# Patient Record
Sex: Male | Born: 1955
Health system: Southern US, Community
[De-identification: ages and names within clinical notes are randomized; demographics above are authoritative.]

## PROBLEM LIST (undated history)

## (undated) DIAGNOSIS — I10 Essential (primary) hypertension: Secondary | ICD-10-CM

## (undated) DIAGNOSIS — K2970 Gastritis, unspecified, without bleeding: Secondary | ICD-10-CM

## (undated) DIAGNOSIS — K529 Noninfective gastroenteritis and colitis, unspecified: Secondary | ICD-10-CM

## (undated) HISTORY — PX: TONSILLECTOMY: SUR1361

---

## 2005-09-26 ENCOUNTER — Ambulatory Visit (HOSPITAL_COMMUNITY): Admission: RE | Admit: 2005-09-26 | Discharge: 2005-09-26 | Payer: Self-pay | Admitting: Family Medicine

## 2008-08-17 ENCOUNTER — Encounter (INDEPENDENT_AMBULATORY_CARE_PROVIDER_SITE_OTHER): Payer: Self-pay | Admitting: *Deleted

## 2008-09-29 ENCOUNTER — Ambulatory Visit: Payer: Self-pay | Admitting: Gastroenterology

## 2008-09-29 DIAGNOSIS — K625 Hemorrhage of anus and rectum: Secondary | ICD-10-CM | POA: Insufficient documentation

## 2008-09-30 ENCOUNTER — Encounter: Payer: Self-pay | Admitting: Gastroenterology

## 2010-05-16 NOTE — Assessment & Plan Note (Signed)
Summary: rectal bleeding,consult for colonoscopy/ams   Visit Type:  consult Primary Care Provider:  Nobie Putnam, MD  Chief Complaint:  rectal bleeding  x1 and needs tcs.  Colonoscopy        The patient was referred for consultation regarding episode of hematachezia and need for colonoscopy.  He recently has episode of small volume brbpr associated with looser stool which began after starting oral hypoglycemics for newly diagnosed DM.  His meds have been adjusted.  He is having 1-2 formed to loose stools daily without urgency, abd cramping.  Denies rectal pain.  Bleeding stopped after taking suppositories for hemorrhoids.  Denies hb, n/v, dysphagia.  He has lost 25 pounds intentionally since dx of DM.    LABS:  4/10 - CBC, LFTs unremarkable.  Current Medications (verified): 1)  Glipizide Xl 10 Mg Xr24h-Tab (Glipizide) .... Once Daily 2)  Benicar Hct 40-25 Mg Tabs (Olmesartan Medoxomil-Hctz) .... Once Daily 3)  Metformin Hcl 500 Mg Xr24h-Tab (Metformin Hcl) .... 2 Daily With Supper  Allergies (verified): No Known Drug Allergies  Past History:  Past Medical History: Diabetes Hypertension  Past Surgical History: Tonsillectomy  Family History: No FH of Colon Cancer Mother - deceased 27, CVA Father - deceased 43, MI  Social History: Patient is a former smoker. Quit 3 years ago. Smoked for 30 years ago. [employer], 30 years. Married.  3 children in college. Alcohol Use - no   Smoking Status:  quit Drug Use:  no  Review of Systems General:  Complains of weight loss; denies fever and chills. Eyes:  Denies vision loss. ENT:  Denies nasal congestion, sore throat, hoarseness, and difficulty swallowing. CV:  Denies chest pains, angina, palpitations, dyspnea on exertion, and peripheral edema. Resp:  Denies dyspnea at rest, dyspnea with exercise, and cough. GI:  See HPI. GU:  Denies urinary burning and blood in urine. MS:  Denies joint pain / LOM. Derm:  Denies rash. Neuro:   Denies frequent headaches, memory loss, and confusion. Psych:  Denies depression and anxiety. Endo:  Denies unusual weight change. Heme:  Denies bleeding. Allergy:  Denies hives and recurrent infections.  Vital Signs:  Patient profile:   55 year old male Height:      70 inches Weight:      210 pounds BMI:     30.24 Temp:     97.8 degrees F oral Pulse rate:   60 / minute BP sitting:   120 / 78  (left arm) Cuff size:   regular  Vitals Entered By: Hendricks Limes LPN (September 29, 2008 2:39 PM)  Physical Exam  General:  Well developed, well nourished, no acute distress. Head:  Normocephalic and atraumatic. Eyes:  Conjunctivae pink, no scleral icterus.  Mouth:  Oropharyngeal mucosa moist, pink.  No lesions, erythema or exudate.    Neck:  Supple; no masses or thyromegaly. Lungs:  Clear throughout to auscultation. Heart:  Regular rate and rhythm; no murmurs, rubs,  or bruits. Abdomen:  Bowel sounds normal.  Abdomen is soft, nontender, nondistended.  No rebound or guarding.  No hepatosplenomegaly, masses or hernias.  No abdominal bruits.  Rectal:  deferred until time of colonoscopy.   Extremities:  No clubbing, cyanosis, edema or deformities noted. Neurologic:  Alert and  oriented x4;  grossly normal neurologically. Skin:  Intact without significant lesions or rashes. Cervical Nodes:  No significant cervical adenopathy. Psych:  Alert and cooperative. Normal mood and affect.  Impression & Recommendations:  Problem # 1:  RECTAL BLEEDING (ICD-569.3)  Recent small-volume hematachezia.  DDx includes benign-anorectal source, polyps, diverticular bleed, less likely malignancy.  No prior TCS.  Colonoscopy to be performed in near future.  Risks, alternatives, and benefits including but not limited to the risk of reaction to medication, bleeding, infection, and perforation were addressed.  Patient voiced understanding and provided verbal consent.  He will take half-dose of oral hypoglycemics the day  of bowel prep.  Orders: Consultation Level III (02542)  I would like to thank Dr. Nobie Putnam for allowing [place] to take part in the care of this nice patient.

## 2010-05-16 NOTE — Letter (Signed)
Summary: Generic Letter, Intro to Referring  [facility]  [address]   [place], [place] [address]   Phone: [phone]  Fax: [phone]      Aug 17, 2008             RE: Travis Sharp   10/16/1955                 [address]                 [place], [place]  [address]  Dear Appt Secretary,   This pt has been scheduled an appt for 09/29/2008 @ 2:30 w/ Tana Coast, PA.   LMOM with appt info and for a return call.          Sincerely,    Elinor Parkinson  [facility] Ph: [phone]   Fax: [phone]

## 2010-05-16 NOTE — Letter (Signed)
Summary: records from [facility]  records from [facility]   Imported By: Elinor Parkinson 09/30/2008 07:45:02  _____________________________________________________________________  External Attachment:    Type:   Image     Comment:   External Document

## 2011-11-19 ENCOUNTER — Emergency Department (HOSPITAL_COMMUNITY): Payer: 59

## 2011-11-19 ENCOUNTER — Encounter (HOSPITAL_COMMUNITY): Payer: Self-pay | Admitting: Emergency Medicine

## 2011-11-19 ENCOUNTER — Emergency Department (HOSPITAL_COMMUNITY)
Admission: EM | Admit: 2011-11-19 | Discharge: 2011-11-19 | Disposition: A | Discharge status: Home / Self Care | Payer: 59 | Attending: Emergency Medicine | Admitting: Emergency Medicine

## 2011-11-19 DIAGNOSIS — S6710XA Crushing injury of unspecified finger(s), initial encounter: Secondary | ICD-10-CM | POA: Insufficient documentation

## 2011-11-19 DIAGNOSIS — S61219A Laceration without foreign body of unspecified finger without damage to nail, initial encounter: Secondary | ICD-10-CM

## 2011-11-19 DIAGNOSIS — E119 Type 2 diabetes mellitus without complications: Secondary | ICD-10-CM | POA: Insufficient documentation

## 2011-11-19 DIAGNOSIS — I10 Essential (primary) hypertension: Secondary | ICD-10-CM | POA: Insufficient documentation

## 2011-11-19 DIAGNOSIS — S61209A Unspecified open wound of unspecified finger without damage to nail, initial encounter: Secondary | ICD-10-CM | POA: Insufficient documentation

## 2011-11-19 DIAGNOSIS — W230XXA Caught, crushed, jammed, or pinched between moving objects, initial encounter: Secondary | ICD-10-CM | POA: Insufficient documentation

## 2011-11-19 HISTORY — DX: Essential (primary) hypertension: I10

## 2011-11-19 LAB — GLUCOSE, CAPILLARY: Glucose-Capillary: 280 mg/dL — ABNORMAL HIGH (ref 70–99)

## 2011-11-19 MED ORDER — LIDOCAINE HCL (PF) 1 % IJ SOLN
INTRAMUSCULAR | Status: AC
  Administered 2011-11-19: 04:00:00
  Filled 2011-11-19: qty 5

## 2011-11-19 MED ORDER — BUPIVACAINE HCL (PF) 0.25 % IJ SOLN
INTRAMUSCULAR | Status: AC
  Administered 2011-11-19: 30 mL
  Filled 2011-11-19: qty 30

## 2011-11-19 MED ORDER — BUPIVACAINE HCL (PF) 0.5 % IJ SOLN
INTRAMUSCULAR | Status: AC
  Filled 2011-11-19: qty 30

## 2011-11-19 NOTE — ED Provider Notes (Signed)
History     CSN: [id number]  Arrival date & time 11/19/11  0139   None     Chief Complaint  Patient presents with  . Finger Injury    (Consider location/radiation/quality/duration/timing/severity/associated sxs/prior treatment) HPI  Travis Sharp is a 56 y.o. male who presents to the Emergency Department complaining of right little finger pain and laceration after slamming the car trunk on the finger. Has taken no medicines.  Past Medical History  Diagnosis Date  . Hypertension   . Diabetes mellitus     Past Surgical History  Procedure Date  . Tonsillectomy     History reviewed. No pertinent family history.  History  Substance Use Topics  . Smoking status: Former Games developer  . Smokeless tobacco: Not on file  . Alcohol Use: Yes     occ      Review of Systems  Constitutional: Negative for fever.       10 Systems reviewed and are negative for acute change except as noted in the HPI.  HENT: Negative for congestion.   Eyes: Negative for discharge and redness.  Respiratory: Negative for cough and shortness of breath.   Cardiovascular: Negative for chest pain.  Gastrointestinal: Negative for vomiting and abdominal pain.  Musculoskeletal: Negative for back pain.       Right 5th finger laceration  Skin: Negative for rash.  Neurological: Negative for syncope, numbness and headaches.  Psychiatric/Behavioral:       No behavior change.    Allergies  Review of patient's allergies indicates no known allergies.  Home Medications   Current Outpatient Rx  Name Route Sig Dispense Refill  . METFORMIN HCL 500 MG PO TABS Oral Take 500 mg by mouth 2 (two) times daily with a meal.    . OLMESARTAN MEDOXOMIL 40 MG PO TABS Oral Take 40 mg by mouth daily.      BP 145/103  Pulse 74  Temp 98.8 F (37.1 C) (Oral)  Resp 16  Ht 5\' 9"  (1.753 m)  Wt 198 lb (89.812 kg)  BMI 29.24 kg/m2  SpO2 98%  Physical Exam  Nursing note and vitals reviewed. Constitutional:       Awake,  alert, nontoxic appearance.  HENT:  Head: Atraumatic.  Eyes: Right eye exhibits no discharge. Left eye exhibits no discharge.  Neck: Neck supple.  Cardiovascular: Normal heart sounds.   Pulmonary/Chest: Effort normal and breath sounds normal. He exhibits no tenderness.  Abdominal: Soft. There is no tenderness. There is no rebound.  Musculoskeletal: He exhibits no tenderness.       Baseline ROM, no obvious new focal weakness.Right 5th finger with laceration to volar surface that extends around the finger without degloving. Superficial laceration right at leading edge of nail, nail bed intact.   Neurological:       Mental status and motor strength appears baseline for patient and situation.  Skin: No rash noted.  Psychiatric: He has a normal mood and affect.    ED Course  Procedures  Performed by: Annamarie Dawley. Authorized by: Annamarie Dawley Consent: Verbal consent obtained. Risks and benefits: risks, benefits and alternatives were discussed Consent given by: patient Patient identity confirmed: provided demographic data Prepped and Draped in normal sterile fashion Wound explored Laceration Location: right 5th finger Laceration Length: 2 cm No Foreign Bodies seen or palpated Anesthesia: digital block Local anesthetic: bupivacaine 0.25%,lidocaine 1% w/o epinephrine Anesthetic total: 3.5 ml Irrigation method: syringe Amount of cleaning: standard Skin closure: sutures Number of sutures: 2 Technique:simple interrupted Patient  tolerance: Patient tolerated the procedure well with no immediate complications.  Laceration extends around to front of nail, no nail bed involvement. Laceration at nail edge is superficial. Does not require sutures.    Critical care time: 30  Results for orders placed during the hospital encounter of 11/19/11  GLUCOSE, CAPILLARY      Component Value Range   Glucose-Capillary 280 (*) 70 - 99 mg/dL   Dg Finger Little Right  11/19/2011  *RADIOLOGY  REPORT*  Clinical Data: Laceration and bruising to the fifth finger after injury.  RIGHT LITTLE FINGER 2+V  Comparison: None.  Findings: Soft tissue irregularity along the distal aspect of the right fifth finger consistent with soft tissue injury, laceration, and swelling.  Underlying bones appear intact.  No evidence of acute fracture or subluxation.  No focal bone lesion or bone destruction.  IMPRESSION: Soft tissue injury to the distal aspect right fifth finger.  No acute bony abnormalities.  Original Report Authenticated By: Marlon Pel, M.D.    MDM  Patient with crush injury to right 5th finger. Laceration repaired. Vaseline gauze with bulky dressing applied. Pt stable in ED with no significant deterioration in condition.The patient appears reasonably screened and/or stabilized for discharge and I doubt any other medical condition or other [facility] requiring further screening, evaluation, or treatment in the ED at this time prior to discharge.  MDM Reviewed: nursing note and vitals Interpretation: x-ray           Nicoletta Dress. Colon Branch, MD 11/19/11 [phone]

## 2011-11-19 NOTE — ED Notes (Signed)
Patient states he slammed the trunk of his car on his right pinky finger. Open laceration noted to finger, bleeding controlled.

## 2014-12-14 LAB — HEMOGLOBIN A1C: Hemoglobin A1C: 14

## 2015-03-23 ENCOUNTER — Inpatient Hospital Stay (HOSPITAL_COMMUNITY)
Admission: EM | Admit: 2015-03-23 | Discharge: 2015-03-27 | DRG: 392 | Disposition: A | Discharge status: Home / Self Care | Payer: BLUE CROSS/BLUE SHIELD | Attending: Internal Medicine | Admitting: Internal Medicine

## 2015-03-23 ENCOUNTER — Emergency Department (HOSPITAL_COMMUNITY): Payer: BLUE CROSS/BLUE SHIELD

## 2015-03-23 ENCOUNTER — Encounter (HOSPITAL_COMMUNITY): Payer: Self-pay

## 2015-03-23 DIAGNOSIS — E860 Dehydration: Secondary | ICD-10-CM | POA: Diagnosis present

## 2015-03-23 DIAGNOSIS — K529 Noninfective gastroenteritis and colitis, unspecified: Secondary | ICD-10-CM | POA: Diagnosis present

## 2015-03-23 DIAGNOSIS — Z87891 Personal history of nicotine dependence: Secondary | ICD-10-CM

## 2015-03-23 DIAGNOSIS — Z794 Long term (current) use of insulin: Secondary | ICD-10-CM

## 2015-03-23 DIAGNOSIS — R112 Nausea with vomiting, unspecified: Secondary | ICD-10-CM | POA: Diagnosis not present

## 2015-03-23 DIAGNOSIS — E1159 Type 2 diabetes mellitus with other circulatory complications: Secondary | ICD-10-CM

## 2015-03-23 DIAGNOSIS — E119 Type 2 diabetes mellitus without complications: Secondary | ICD-10-CM | POA: Diagnosis present

## 2015-03-23 DIAGNOSIS — R1310 Dysphagia, unspecified: Secondary | ICD-10-CM | POA: Diagnosis present

## 2015-03-23 DIAGNOSIS — E876 Hypokalemia: Secondary | ICD-10-CM | POA: Diagnosis not present

## 2015-03-23 DIAGNOSIS — Z23 Encounter for immunization: Secondary | ICD-10-CM

## 2015-03-23 DIAGNOSIS — K5900 Constipation, unspecified: Secondary | ICD-10-CM | POA: Diagnosis present

## 2015-03-23 DIAGNOSIS — T378X5A Adverse effect of other specified systemic anti-infectives and antiparasitics, initial encounter: Secondary | ICD-10-CM | POA: Diagnosis present

## 2015-03-23 DIAGNOSIS — I10 Essential (primary) hypertension: Secondary | ICD-10-CM | POA: Diagnosis present

## 2015-03-23 HISTORY — DX: Noninfective gastroenteritis and colitis, unspecified: K52.9

## 2015-03-23 LAB — CBC WITH DIFFERENTIAL/PLATELET
Basophils Absolute: 0 10*3/uL (ref 0.0–0.1)
Basophils Relative: 0 %
Eosinophils Absolute: 0 10*3/uL (ref 0.0–0.7)
Eosinophils Relative: 0 %
HCT: 49.7 % (ref 39.0–52.0)
Hemoglobin: 18.3 g/dL — ABNORMAL HIGH (ref 13.0–17.0)
Lymphocytes Relative: 18 %
Lymphs Abs: 1.8 10*3/uL (ref 0.7–4.0)
MCH: 30.7 pg (ref 26.0–34.0)
MCHC: 36.8 g/dL — ABNORMAL HIGH (ref 30.0–36.0)
MCV: 83.4 fL (ref 78.0–100.0)
Monocytes Absolute: 0.6 10*3/uL (ref 0.1–1.0)
Monocytes Relative: 6 %
Neutro Abs: 7.6 10*3/uL (ref 1.7–7.7)
Neutrophils Relative %: 76 %
Platelets: 259 10*3/uL (ref 150–400)
RBC: 5.96 MIL/uL — ABNORMAL HIGH (ref 4.22–5.81)
RDW: 12.8 % (ref 11.5–15.5)
WBC: 10 10*3/uL (ref 4.0–10.5)

## 2015-03-23 LAB — URINALYSIS, ROUTINE W REFLEX MICROSCOPIC
Bilirubin Urine: NEGATIVE
Glucose, UA: NEGATIVE mg/dL
Hgb urine dipstick: NEGATIVE
Ketones, ur: NEGATIVE mg/dL
Leukocytes, UA: NEGATIVE
Nitrite: NEGATIVE
Protein, ur: NEGATIVE mg/dL
Specific Gravity, Urine: 1.015 (ref 1.005–1.030)
pH: 7 (ref 5.0–8.0)

## 2015-03-23 LAB — GLUCOSE, CAPILLARY: Glucose-Capillary: 152 mg/dL — ABNORMAL HIGH (ref 65–99)

## 2015-03-23 LAB — COMPREHENSIVE METABOLIC PANEL
ALT: 24 U/L (ref 17–63)
AST: 19 U/L (ref 15–41)
Albumin: 4.9 g/dL (ref 3.5–5.0)
Alkaline Phosphatase: 55 U/L (ref 38–126)
Anion gap: 12 (ref 5–15)
BUN: 20 mg/dL (ref 6–20)
CO2: 26 mmol/L (ref 22–32)
Calcium: 9.8 mg/dL (ref 8.9–10.3)
Chloride: 96 mmol/L — ABNORMAL LOW (ref 101–111)
Creatinine, Ser: 0.87 mg/dL (ref 0.61–1.24)
GFR calc Af Amer: >60 mL/min (ref >60)
GFR calc non Af Amer: >60 mL/min (ref >60)
Glucose, Bld: 251 mg/dL — ABNORMAL HIGH (ref 65–99)
Potassium: 3.5 mmol/L (ref 3.5–5.1)
Sodium: 134 mmol/L — ABNORMAL LOW (ref 135–145)
Total Bilirubin: 1.8 mg/dL — ABNORMAL HIGH (ref 0.3–1.2)
Total Protein: 8.5 g/dL — ABNORMAL HIGH (ref 6.5–8.1)

## 2015-03-23 LAB — PHOSPHORUS: Phosphorus: 4 mg/dL (ref 2.5–4.6)

## 2015-03-23 LAB — MAGNESIUM: Magnesium: 1.8 mg/dL (ref 1.7–2.4)

## 2015-03-23 LAB — CBG MONITORING, ED: Glucose-Capillary: 224 mg/dL — ABNORMAL HIGH (ref 65–99)

## 2015-03-23 LAB — LIPASE, BLOOD: Lipase: 45 U/L (ref 11–51)

## 2015-03-23 MED ORDER — CIPROFLOXACIN IN D5W 400 MG/200ML IV SOLN: 400.0000 mg | Freq: Two times a day (BID) | INTRAVENOUS | Status: DC

## 2015-03-23 MED ORDER — SODIUM CHLORIDE 0.9 % IV BOLUS (SEPSIS)
1000.0000 mL | Freq: Once | INTRAVENOUS | Status: AC
  Administered 2015-03-23: 1000 mL via INTRAVENOUS

## 2015-03-23 MED ORDER — HEPARIN SODIUM (PORCINE) 5000 UNIT/ML IJ SOLN
5000.0000 [IU] | Freq: Three times a day (TID) | INTRAMUSCULAR | Status: DC
  Administered 2015-03-24 – 2015-03-27 (×10): 5000 [IU] via SUBCUTANEOUS
  Filled 2015-03-23 (×11): qty 1

## 2015-03-23 MED ORDER — SODIUM CHLORIDE 0.9 % IV SOLN: 250.0000 mL | INTRAVENOUS | Status: DC | PRN

## 2015-03-23 MED ORDER — SODIUM CHLORIDE 0.9 % IJ SOLN: 3.0000 mL | INTRAMUSCULAR | Status: DC | PRN

## 2015-03-23 MED ORDER — SODIUM CHLORIDE 0.9 % IJ SOLN
3.0000 mL | Freq: Two times a day (BID) | INTRAMUSCULAR | Status: DC
  Administered 2015-03-25 – 2015-03-27 (×3): 3 mL via INTRAVENOUS

## 2015-03-23 MED ORDER — INSULIN ASPART 100 UNIT/ML ~~LOC~~ SOLN
0.0000 [IU] | Freq: Three times a day (TID) | SUBCUTANEOUS | Status: DC
  Administered 2015-03-23: 2 [IU] via SUBCUTANEOUS
  Administered 2015-03-24 – 2015-03-27 (×9): 1 [IU] via SUBCUTANEOUS

## 2015-03-23 MED ORDER — SODIUM CHLORIDE 0.9 % IV SOLN
INTRAVENOUS | Status: DC
  Administered 2015-03-23: 19:00:00 via INTRAVENOUS

## 2015-03-23 MED ORDER — PROMETHAZINE HCL 25 MG/ML IJ SOLN
12.5000 mg | Freq: Once | INTRAMUSCULAR | Status: AC
  Administered 2015-03-23: 12.5 mg via INTRAVENOUS
  Filled 2015-03-23: qty 1

## 2015-03-23 MED ORDER — OXYCODONE HCL 5 MG PO TABS: 5.0000 mg | ORAL_TABLET | ORAL | Status: DC | PRN

## 2015-03-23 MED ORDER — INFLUENZA VAC SPLIT QUAD 0.5 ML IM SUSY
0.5000 mL | PREFILLED_SYRINGE | INTRAMUSCULAR | Status: AC
  Administered 2015-03-24: 0.5 mL via INTRAMUSCULAR
  Filled 2015-03-23: qty 0.5

## 2015-03-23 MED ORDER — IOHEXOL 300 MG/ML  SOLN
100.0000 mL | Freq: Once | INTRAMUSCULAR | Status: AC | PRN
  Administered 2015-03-23: 100 mL via INTRAVENOUS

## 2015-03-23 MED ORDER — METRONIDAZOLE IN NACL 5-0.79 MG/ML-% IV SOLN: 500.0000 mg | Freq: Three times a day (TID) | INTRAVENOUS | Status: DC

## 2015-03-23 MED ORDER — ONDANSETRON HCL 4 MG PO TABS: 4.0000 mg | ORAL_TABLET | Freq: Four times a day (QID) | ORAL | Status: DC | PRN

## 2015-03-23 MED ORDER — CIPROFLOXACIN IN D5W 400 MG/200ML IV SOLN
400.0000 mg | Freq: Two times a day (BID) | INTRAVENOUS | Status: DC
  Administered 2015-03-24 – 2015-03-26 (×6): 400 mg via INTRAVENOUS
  Filled 2015-03-23 (×6): qty 200

## 2015-03-23 MED ORDER — METRONIDAZOLE IN NACL 5-0.79 MG/ML-% IV SOLN
500.0000 mg | Freq: Once | INTRAVENOUS | Status: AC
  Administered 2015-03-23: 500 mg via INTRAVENOUS
  Filled 2015-03-23: qty 100

## 2015-03-23 MED ORDER — ONDANSETRON HCL 4 MG/2ML IJ SOLN
4.0000 mg | Freq: Four times a day (QID) | INTRAMUSCULAR | Status: DC | PRN
  Administered 2015-03-23 – 2015-03-24 (×4): 4 mg via INTRAVENOUS
  Filled 2015-03-23 (×7): qty 2

## 2015-03-23 MED ORDER — CIPROFLOXACIN IN D5W 400 MG/200ML IV SOLN
400.0000 mg | Freq: Once | INTRAVENOUS | Status: AC
  Administered 2015-03-23: 400 mg via INTRAVENOUS
  Filled 2015-03-23: qty 200

## 2015-03-23 MED ORDER — ONDANSETRON HCL 4 MG/2ML IJ SOLN
4.0000 mg | Freq: Once | INTRAMUSCULAR | Status: AC
  Administered 2015-03-23: 4 mg via INTRAVENOUS
  Filled 2015-03-23: qty 2

## 2015-03-23 MED ORDER — METRONIDAZOLE IN NACL 5-0.79 MG/ML-% IV SOLN
500.0000 mg | Freq: Three times a day (TID) | INTRAVENOUS | Status: DC
  Administered 2015-03-23 – 2015-03-25 (×5): 500 mg via INTRAVENOUS
  Filled 2015-03-23 (×5): qty 100

## 2015-03-23 MED ORDER — PNEUMOCOCCAL VAC POLYVALENT 25 MCG/0.5ML IJ INJ
0.5000 mL | INJECTION | INTRAMUSCULAR | Status: AC
  Administered 2015-03-24: 0.5 mL via INTRAMUSCULAR
  Filled 2015-03-23: qty 0.5

## 2015-03-23 MED ORDER — MORPHINE SULFATE (PF) 2 MG/ML IV SOLN: 1.0000 mg | INTRAVENOUS | Status: DC | PRN

## 2015-03-23 MED ORDER — INSULIN ASPART 100 UNIT/ML ~~LOC~~ SOLN: 0.0000 [IU] | Freq: Every day | SUBCUTANEOUS | Status: DC

## 2015-03-23 MED ORDER — IRBESARTAN 300 MG PO TABS
300.0000 mg | ORAL_TABLET | Freq: Every day | ORAL | Status: DC
  Administered 2015-03-24: 300 mg via ORAL
  Filled 2015-03-23 (×2): qty 1

## 2015-03-23 MED ORDER — INSULIN DETEMIR 100 UNIT/ML ~~LOC~~ SOLN
10.0000 [IU] | Freq: Every day | SUBCUTANEOUS | Status: DC
  Administered 2015-03-24 – 2015-03-26 (×4): 10 [IU] via SUBCUTANEOUS
  Filled 2015-03-23 (×5): qty 0.1

## 2015-03-23 MED ORDER — HEPARIN SODIUM (PORCINE) 5000 UNIT/ML IJ SOLN: 5000.0000 [IU] | Freq: Three times a day (TID) | INTRAMUSCULAR | Status: DC

## 2015-03-23 NOTE — ED Notes (Signed)
Pt reports intermittent nausea, vomiting, weakness, and left side pain x 1 month.  Pt says has had some problems with constipation but had a bm this morning.  Reports stool was black.  Pt says has noticed black stools off and on.

## 2015-03-23 NOTE — ED Provider Notes (Signed)
CSN: [id number]     Arrival date & time 03/23/15  [id number] History   First MD Initiated Contact with Patient 03/23/15 0701     Chief Complaint  Patient presents with  . Emesis     (Consider location/radiation/quality/duration/timing/severity/associated sxs/prior Treatment) Patient is a 59 y.o. male presenting with vomiting. The history is provided by the patient.  Emesis Severity:  Moderate Associated symptoms: abdominal pain and chills   Associated symptoms: no diarrhea and no headaches    patient has had nausea and vomiting. He's had for last few days. Worse with eating. States he has had some constipation. States he has had black stool and some black emesis. Set some mild crampy abdominal pain worse on left side. No fevers. He has had a good appetite but states as soon as he eats the appetite will go away. He states he did have some chills. He has lost close to 50 pounds over the last several months. He has not been trying. He is a former smoker. He does smoke some marijuana but states not every day.   Past Medical History  Diagnosis Date  . Hypertension   . Diabetes mellitus    Past Surgical History  Procedure Laterality Date  . Tonsillectomy     No family history on file. Social History  Substance Use Topics  . Smoking status: Former Research scientist (life sciences)  . Smokeless tobacco: None  . Alcohol Use: Yes     Comment: occ    Review of Systems  Constitutional: Positive for chills, appetite change and unexpected weight change. Negative for activity change.  Eyes: Negative for pain.  Respiratory: Negative for chest tightness and shortness of breath.   Cardiovascular: Negative for chest pain and leg swelling.  Gastrointestinal: Positive for nausea, vomiting and abdominal pain. Negative for diarrhea.  Genitourinary: Negative for flank pain.  Musculoskeletal: Negative for back pain and neck stiffness.  Skin: Negative for rash.  Neurological: Negative for weakness, numbness and headaches.   Psychiatric/Behavioral: Negative for behavioral problems.      Allergies  Review of patient's allergies indicates no known allergies.  Home Medications   Prior to Admission medications   Medication Sig Start Date End Date Taking? Authorizing Provider  BENICAR HCT 40-25 MG tablet Take 1 tablet by mouth daily. 12/14/14  Yes Historical Provider, MD  ibuprofen (ADVIL,MOTRIN) 200 MG tablet Take 200 mg by mouth every 6 (six) hours as needed.   Yes Historical Provider, MD  Insulin Degludec (TRESIBA FLEXTOUCH) 100 UNIT/ML SOPN Inject 22 Units into the skin daily.   Yes Historical Provider, MD  insulin detemir (LEVEMIR) 100 UNIT/ML injection Inject 20 Units into the skin at bedtime.   Yes Historical Provider, MD   BP 115/78 mmHg  Pulse 80  Temp(Src) 98.3 F (36.8 C) (Oral)  Resp 18  Ht 5\' 10"  (1.778 m)  Wt 180 lb (81.647 kg)  BMI 25.83 kg/m2  SpO2 100% Physical Exam  Constitutional: He appears well-developed and well-nourished.  HENT:  Head: Atraumatic.  Neck: Neck supple.  Cardiovascular:  Mild tachycardia  Pulmonary/Chest: Effort normal.  Abdominal: Soft. There is no tenderness.  Musculoskeletal: Normal range of motion.  Neurological: He is alert.  Skin: Skin is warm.    ED Course  Procedures (including critical care time) Labs Review Labs Reviewed  CBC WITH DIFFERENTIAL/PLATELET - Abnormal; Notable for the following:    RBC 5.96 (*)    Hemoglobin 18.3 (*)    MCHC 36.8 (*)    All other components within normal limits  COMPREHENSIVE METABOLIC PANEL - Abnormal; Notable for the following:    Sodium 134 (*)    Chloride 96 (*)    Glucose, Bld 251 (*)    Total Protein 8.5 (*)    Total Bilirubin 1.8 (*)    All other components within normal limits  CBG MONITORING, ED - Abnormal; Notable for the following:    Glucose-Capillary 224 (*)    All other components within normal limits  URINALYSIS, ROUTINE W REFLEX MICROSCOPIC (NOT AT [facility])  LIPASE, BLOOD  POC OCCULT BLOOD, ED     Imaging Review Dg Chest 2 View  03/23/2015  CLINICAL DATA:  Weight loss.  Former smoker. EXAM: CHEST  2 VIEW COMPARISON:  09/26/2005 FINDINGS: Normal heart size and mediastinal contours. No acute infiltrate or edema. No effusion or pneumothorax. No acute osseous findings. IMPRESSION: No acute finding or change from 2007. Electronically Signed   By: Monte Fantasia M.D.   On: 03/23/2015 08:04   Ct Abdomen Pelvis W Contrast  03/23/2015  CLINICAL DATA:  59 year old male with 1 month of left side abdominal pain. Black stools. Initial encounter. EXAM: CT ABDOMEN AND PELVIS WITH CONTRAST TECHNIQUE: Multidetector CT imaging of the abdomen and pelvis was performed using the standard protocol following bolus administration of intravenous contrast. CONTRAST:  158mL OMNIPAQUE IOHEXOL 300 MG/ML  SOLN COMPARISON:  None. FINDINGS: Negative lung bases.  No pericardial or pleural effusion. No acute osseous abnormality identified. No pelvic free fluid.  Unremarkable urinary bladder. The colon is mostly decompressed throughout the abdomen and appears mildly to moderately thick walled throughout. This is most apparent in the sigmoid colon (wall thickness up to 10 mm). There is generalized mild mesenteric stranding/congestion. The appendix is normal. No diverticulosis identified. No pneumoperitoneum. No abdominal free fluid. No dilated or abnormal small bowel loops. Small volume of oral contrast in the stomach, duodenum and jejunum. Liver, gallbladder, spleen, pancreas, and adrenal glands are within normal limits. Portal venous system within normal limits. Bilateral renal enhancement and contrast excretion within normal limits. Aortoiliac calcified atherosclerosis noted. Major arterial structures are patent. No lymphadenopathy. IMPRESSION: 1. Mild-to-moderate diffuse colitis suspected. No associated free fluid or complicating features. 2. No other acute findings.  Calcified aortic atherosclerosis. Electronically Signed   By:  Genevie Ann M.D.   On: 03/23/2015 11:41   I have personally reviewed and evaluated these images and lab results as part of my medical decision-making.   EKG Interpretation None      MDM   Final diagnoses:  Colitis    Patient with abdominal pain nausea and vomiting. Has elevated hemoglobin. Likely due to dehydration. Has also had a fair amount weight loss. Continue to have nausea and vomiting here. CT scan shows a colitis. Unable tolerate orals will admit to internal medicine.    Davonna Belling, MD 03/23/15 1501

## 2015-03-23 NOTE — ED Notes (Signed)
Patient in restroom. States needs to have BM. Also will obtain urine specimen.

## 2015-03-23 NOTE — ED Notes (Signed)
Patient c/o nausea. Dry heaving noted. MD aware. Verbal order for Phenergan obtained.

## 2015-03-23 NOTE — H&P (Signed)
[facility] History and Physical  KJ COENEN [id number] DOB: 12-25-1955 DOA: 03/23/2015  Referring physician: Dr. Alvino Chapel PCP: Collene Mares, PA-C   Chief Complaint: nausea and emesis  HPI: Travis Sharp is a 59 y.o. male  With history of diabetes mellitus type 2 and essential hypertension. Presents to the hospital complaining of one month of nausea and emesis which has waxed and waned. Nothing he is aware of makes it better or worse. The problem is persistent since onset. Today was gradually getting worse and as such patient presented to the ED for further evaluation recommendations. Patient also endorses poor oral intake. Denies any bright red blood per rectum or melena.   Review of Systems:  Constitutional:  No weight loss, night sweats, Fevers, chills, fatigue.  HEENT:  No headaches, Difficulty swallowing,Tooth/dental problems,Sore throat,  No sneezing, itching, ear ache, nasal congestion, post nasal drip,  Cardio-vascular:  No chest pain, Orthopnea, PND, swelling in lower extremities, anasarca, dizziness, palpitations  GI:  No heartburn, indigestion, + abdominal pain, + nausea, + vomiting, diarrhea, change in bowel habits,+ loss of appetite  Resp:  No shortness of breath with exertion or at rest. No excess mucus, no productive cough, No non-productive cough, No coughing up of blood.No change in color of mucus.No wheezing.No chest wall deformity  Skin:  no rash or lesions.  GU:  no dysuria, change in color of urine, no urgency or frequency. No flank pain.  Musculoskeletal:  No joint pain or swelling. No decreased range of motion. No back pain.  Psych:  No change in mood or affect. No depression or anxiety. No memory loss.   Past Medical History  Diagnosis Date  . Hypertension   . Diabetes mellitus    Past Surgical History  Procedure Laterality Date  . Tonsillectomy     Social History:  reports that he has quit smoking. He does not have any smokeless  tobacco history on file. He reports that he drinks alcohol. He reports that he does not use illicit drugs.  No Known Allergies  Family history: None reported  Prior to Admission medications   Medication Sig Start Date End Date Taking? Authorizing Provider  BENICAR HCT 40-25 MG tablet Take 1 tablet by mouth daily. 12/14/14  Yes Historical Provider, MD  ibuprofen (ADVIL,MOTRIN) 200 MG tablet Take 200 mg by mouth every 6 (six) hours as needed.   Yes Historical Provider, MD  Insulin Degludec (TRESIBA FLEXTOUCH) 100 UNIT/ML SOPN Inject 22 Units into the skin daily.   Yes Historical Provider, MD  insulin detemir (LEVEMIR) 100 UNIT/ML injection Inject 20 Units into the skin at bedtime.   Yes Historical Provider, MD   Physical Exam: Filed Vitals:   03/23/15 0707 03/23/15 0956 03/23/15 1258 03/23/15 1500  BP: 141/91 117/88 115/78 128/88  Pulse: 106 81 80 76  Temp: 97.8 F (36.6 C) 98.3 F (36.8 C)    TempSrc: Oral Oral    Resp:  22 18 18   Height: 5\' 10"  (1.778 m)     Weight: 81.647 kg (180 lb)     SpO2: 98% 100% 100% 100%    Wt Readings from Last 3 Encounters:  03/23/15 81.647 kg (180 lb)  11/19/11 89.812 kg (198 lb)  09/29/08 95.255 kg (210 lb)    General:  Appears calm and uncomfortable Eyes: PERRL, normal lids, irises & conjunctiva ENT: grossly normal hearing, lips & tongue, dry mucous membranes Neck: no LAD, masses or thyromegaly Cardiovascular: RRR, no m/r/g. No LE edema. Respiratory: CTA bilaterally,  no w/r/r. Normal respiratory effort. Abdomen: soft, mild tenderness with deep palpation over left lower quadrant. No rebound tenderness, nd Skin: no rash or induration seen on limited exam Musculoskeletal: grossly normal tone BUE/BLE Psychiatric: grossly normal mood and affect, speech fluent and appropriate Neurologic: Answers questions properly, moves extremities equally           Labs on Admission:  Basic Metabolic Panel:  Recent Labs Lab 03/23/15 0708  NA 134*  K  3.5  CL 96*  CO2 26  GLUCOSE 251*  BUN 20  CREATININE 0.87  CALCIUM 9.8   Liver Function Tests:  Recent Labs Lab 03/23/15 0708  AST 19  ALT 24  ALKPHOS 55  BILITOT 1.8*  PROT 8.5*  ALBUMIN 4.9    Recent Labs Lab 03/23/15 0708  LIPASE 45   No results for input(s): AMMONIA in the last 168 hours. CBC:  Recent Labs Lab 03/23/15 0708  WBC 10.0  NEUTROABS 7.6  HGB 18.3*  HCT 49.7  MCV 83.4  PLT 259   Cardiac Enzymes: No results for input(s): CKTOTAL, CKMB, CKMBINDEX, TROPONINI in the last 168 hours.  BNP (last 3 results) No results for input(s): BNP in the last 8760 hours.  ProBNP (last 3 results) No results for input(s): PROBNP in the last 8760 hours.  CBG:  Recent Labs Lab 03/23/15 0707  GLUCAP 224*    Radiological Exams on Admission: Dg Chest 2 View  03/23/2015  CLINICAL DATA:  Weight loss.  Former smoker. EXAM: CHEST  2 VIEW COMPARISON:  09/26/2005 FINDINGS: Normal heart size and mediastinal contours. No acute infiltrate or edema. No effusion or pneumothorax. No acute osseous findings. IMPRESSION: No acute finding or change from 2007. Electronically Signed   By: Monte Fantasia M.D.   On: 03/23/2015 08:04   Ct Abdomen Pelvis W Contrast  03/23/2015  CLINICAL DATA:  59 year old male with 1 month of left side abdominal pain. Black stools. Initial encounter. EXAM: CT ABDOMEN AND PELVIS WITH CONTRAST TECHNIQUE: Multidetector CT imaging of the abdomen and pelvis was performed using the standard protocol following bolus administration of intravenous contrast. CONTRAST:  178mL OMNIPAQUE IOHEXOL 300 MG/ML  SOLN COMPARISON:  None. FINDINGS: Negative lung bases.  No pericardial or pleural effusion. No acute osseous abnormality identified. No pelvic free fluid.  Unremarkable urinary bladder. The colon is mostly decompressed throughout the abdomen and appears mildly to moderately thick walled throughout. This is most apparent in the sigmoid colon (wall thickness up to 10  mm). There is generalized mild mesenteric stranding/congestion. The appendix is normal. No diverticulosis identified. No pneumoperitoneum. No abdominal free fluid. No dilated or abnormal small bowel loops. Small volume of oral contrast in the stomach, duodenum and jejunum. Liver, gallbladder, spleen, pancreas, and adrenal glands are within normal limits. Portal venous system within normal limits. Bilateral renal enhancement and contrast excretion within normal limits. Aortoiliac calcified atherosclerosis noted. Major arterial structures are patent. No lymphadenopathy. IMPRESSION: 1. Mild-to-moderate diffuse colitis suspected. No associated free fluid or complicating features. 2. No other acute findings.  Calcified aortic atherosclerosis. Electronically Signed   By: Genevie Ann M.D.   On: 03/23/2015 11:41     Assessment/Plan Active Problems:   Colitis - CT of abdomen and pelvis report reviewed. Reporting colitis. - Place on Cipro and Flagyl - Place on clear liquid diet  Nausea and vomiting - Secondary to principal problem list above - We'll plan on treating supportively  Diabetic diet - We'll plan on advancing diet to diabetic diet once tolerated -  Place on sliding scale insulin and continue long acting insulin at half dose as patient is not eating well  Essential hypertension - We'll plan on continuing ARB  Code Status: Full DVT Prophylaxis: Heparin Family Communication: Discussed with patient and family at bedside Disposition Plan: Pending improvement in condition  Time spent: > 45 minutes  Velvet Bathe [facility] Pager [phone]

## 2015-03-23 NOTE — ED Notes (Signed)
MD at bedside. 

## 2015-03-24 DIAGNOSIS — I10 Essential (primary) hypertension: Secondary | ICD-10-CM

## 2015-03-24 DIAGNOSIS — E119 Type 2 diabetes mellitus without complications: Secondary | ICD-10-CM | POA: Diagnosis not present

## 2015-03-24 DIAGNOSIS — R112 Nausea with vomiting, unspecified: Secondary | ICD-10-CM

## 2015-03-24 DIAGNOSIS — K529 Noninfective gastroenteritis and colitis, unspecified: Secondary | ICD-10-CM | POA: Diagnosis not present

## 2015-03-24 DIAGNOSIS — E1159 Type 2 diabetes mellitus with other circulatory complications: Secondary | ICD-10-CM

## 2015-03-24 DIAGNOSIS — E876 Hypokalemia: Secondary | ICD-10-CM | POA: Diagnosis not present

## 2015-03-24 DIAGNOSIS — Z794 Long term (current) use of insulin: Secondary | ICD-10-CM

## 2015-03-24 LAB — CBC
HCT: 43.4 % (ref 39.0–52.0)
Hemoglobin: 16.2 g/dL (ref 13.0–17.0)
MCH: 31.6 pg (ref 26.0–34.0)
MCHC: 37.3 g/dL — ABNORMAL HIGH (ref 30.0–36.0)
MCV: 84.8 fL (ref 78.0–100.0)
Platelets: 220 10*3/uL (ref 150–400)
RBC: 5.12 MIL/uL (ref 4.22–5.81)
RDW: 12.6 % (ref 11.5–15.5)
WBC: 8.9 10*3/uL (ref 4.0–10.5)

## 2015-03-24 LAB — BASIC METABOLIC PANEL
Anion gap: 8 (ref 5–15)
BUN: 15 mg/dL (ref 6–20)
CO2: 28 mmol/L (ref 22–32)
Calcium: 9 mg/dL (ref 8.9–10.3)
Chloride: 101 mmol/L (ref 101–111)
Creatinine, Ser: 0.91 mg/dL (ref 0.61–1.24)
GFR calc Af Amer: >60 mL/min (ref >60)
GFR calc non Af Amer: >60 mL/min (ref >60)
Glucose, Bld: 161 mg/dL — ABNORMAL HIGH (ref 65–99)
Potassium: 3.2 mmol/L — ABNORMAL LOW (ref 3.5–5.1)
Sodium: 137 mmol/L (ref 135–145)

## 2015-03-24 LAB — GLUCOSE, CAPILLARY
Glucose-Capillary: 177 mg/dL — ABNORMAL HIGH (ref 65–99)
Glucose-Capillary: 132 mg/dL — ABNORMAL HIGH (ref 65–99)
Glucose-Capillary: 146 mg/dL — ABNORMAL HIGH (ref 65–99)
Glucose-Capillary: 133 mg/dL — ABNORMAL HIGH (ref 65–99)
Glucose-Capillary: 177 mg/dL — ABNORMAL HIGH (ref 65–99)

## 2015-03-24 MED ORDER — ONDANSETRON HCL 4 MG/2ML IJ SOLN
4.0000 mg | Freq: Once | INTRAMUSCULAR | Status: AC
Start: 2015-03-24 — End: 2015-03-24
  Administered 2015-03-24: 4 mg via INTRAVENOUS
  Filled 2015-03-24: qty 2

## 2015-03-24 MED ORDER — PROMETHAZINE HCL 25 MG/ML IJ SOLN
12.5000 mg | INTRAMUSCULAR | Status: DC | PRN
  Administered 2015-03-24 – 2015-03-25 (×4): 12.5 mg via INTRAVENOUS
  Filled 2015-03-24 (×4): qty 1

## 2015-03-24 MED ORDER — POTASSIUM CHLORIDE IN NACL 40-0.9 MEQ/L-% IV SOLN
INTRAVENOUS | Status: DC
  Administered 2015-03-24 – 2015-03-26 (×4): 75 mL/h via INTRAVENOUS

## 2015-03-24 MED ORDER — LORAZEPAM 2 MG/ML IJ SOLN
1.0000 mg | Freq: Once | INTRAMUSCULAR | Status: AC
  Administered 2015-03-24: 1 mg via INTRAVENOUS
  Filled 2015-03-24: qty 1

## 2015-03-24 NOTE — Progress Notes (Signed)
[facility] PROGRESS NOTE  ARTAN VOYTKO [id number] DOB: [birth year] DOA: 03/23/2015 PCP: Collene Mares, PA-C    Code Status: Full code Family Communication: Discussed with wife. Disposition Plan: Discharge when clinically appropriate, likely in the next 2-3 days.   Consultants:  None  Procedures:  None  Antibiotics:  Cipro 03/23/59  Flagyl 03/23/15  HPI/Subjective: Patient complains of nausea, but no vomiting. He is tolerating clear liquids.  Objective: Filed Vitals:   03/23/15 2208 03/24/15 0735  BP: 127/89 138/97  Pulse: 76 74  Temp: 98.8 F (37.1 C) 98.3 F (36.8 C)  Resp: 20 20   oxygen saturation 100% on room air.  Intake/Output Summary (Last 24 hours) at 03/24/15 1144 Last data filed at 03/24/15 0517  Gross per 24 hour  Intake   1205 ml  Output      0 ml  Net   1205 ml   Filed Weights   03/23/15 0707 03/23/15 1755  Weight: 81.647 kg (180 lb) 81.194 kg (179 lb)    Exam:   General:  59 year old man sitting up in bed, in no acute distress, but he appears ill.  Cardiovascular: S1, S2, with no murmurs rubs or gallops.   Respiratory: Clear to auscultation bilaterally.   Abdomen: Positive bowel sounds, soft, mildly tender diffusely with no distention or masses palpated.  Musculoskeletal/extremities: No pedal edema. No acute hot red joints.   Data Reviewed: Basic Metabolic Panel:  Recent Labs Lab 03/23/15 0708 03/23/15 1946 03/24/15 0627  NA 134*  --  137  K 3.5  --  3.2*  CL 96*  --  101  CO2 26  --  28  GLUCOSE 251*  --  161*  BUN 20  --  15  CREATININE 0.87  --  0.91  CALCIUM 9.8  --  9.0  MG  --  1.8  --   PHOS  --  4.0  --    Liver Function Tests:  Recent Labs Lab 03/23/15 0708  AST 19  ALT 24  ALKPHOS 55  BILITOT 1.8*  PROT 8.5*  ALBUMIN 4.9    Recent Labs Lab 03/23/15 0708  LIPASE 45   No results for input(s): AMMONIA in the last 168 hours. CBC:  Recent Labs Lab 03/23/15 0708 03/24/15 0627  WBC  10.0 8.9  NEUTROABS 7.6  --   HGB 18.3* 16.2  HCT 49.7 43.4  MCV 83.4 84.8  PLT 259 220   Cardiac Enzymes: No results for input(s): CKTOTAL, CKMB, CKMBINDEX, TROPONINI in the last 168 hours. BNP (last 3 results) No results for input(s): BNP in the last 8760 hours.  ProBNP (last 3 results) No results for input(s): PROBNP in the last 8760 hours.  CBG:  Recent Labs Lab 03/23/15 0707 03/23/15 1754 03/23/15 2208 03/24/15 0725 03/24/15 1136  GLUCAP 224* 152* 177* 132* 146*    No results found for this or any previous visit (from the past 240 hour(s)).   Studies: Dg Chest 2 View  03/23/2015  CLINICAL DATA:  Weight loss.  Former smoker. EXAM: CHEST  2 VIEW COMPARISON:  09/26/2005 FINDINGS: Normal heart size and mediastinal contours. No acute infiltrate or edema. No effusion or pneumothorax. No acute osseous findings. IMPRESSION: No acute finding or change from 2007. Electronically Signed   By: Monte Fantasia M.D.   On: 03/23/2015 08:04   Ct Abdomen Pelvis W Contrast  03/23/2015  CLINICAL DATA:  59 year old male with 1 month of left side abdominal pain. Black stools. Initial encounter. EXAM: CT  ABDOMEN AND PELVIS WITH CONTRAST TECHNIQUE: Multidetector CT imaging of the abdomen and pelvis was performed using the standard protocol following bolus administration of intravenous contrast. CONTRAST:  18mL OMNIPAQUE IOHEXOL 300 MG/ML  SOLN COMPARISON:  None. FINDINGS: Negative lung bases.  No pericardial or pleural effusion. No acute osseous abnormality identified. No pelvic free fluid.  Unremarkable urinary bladder. The colon is mostly decompressed throughout the abdomen and appears mildly to moderately thick walled throughout. This is most apparent in the sigmoid colon (wall thickness up to 10 mm). There is generalized mild mesenteric stranding/congestion. The appendix is normal. No diverticulosis identified. No pneumoperitoneum. No abdominal free fluid. No dilated or abnormal small bowel  loops. Small volume of oral contrast in the stomach, duodenum and jejunum. Liver, gallbladder, spleen, pancreas, and adrenal glands are within normal limits. Portal venous system within normal limits. Bilateral renal enhancement and contrast excretion within normal limits. Aortoiliac calcified atherosclerosis noted. Major arterial structures are patent. No lymphadenopathy. IMPRESSION: 1. Mild-to-moderate diffuse colitis suspected. No associated free fluid or complicating features. 2. No other acute findings.  Calcified aortic atherosclerosis. Electronically Signed   By: Genevie Ann M.D.   On: 03/23/2015 11:41    Scheduled Meds: . ciprofloxacin  400 mg Intravenous Q12H  . heparin  5,000 Units Subcutaneous 3 times per day  . insulin aspart  0-5 Units Subcutaneous QHS  . insulin aspart  0-9 Units Subcutaneous TID WC  . insulin detemir  10 Units Subcutaneous QHS  . irbesartan  300 mg Oral Daily  . metronidazole  500 mg Intravenous Q8H  . sodium chloride  3 mL Intravenous Q12H   Continuous Infusions: . 0.9 % NaCl with KCl 40 mEq / L 75 mL/hr (03/24/15 0754)   Assessment and plan: Principal Problem:   Colitis Active Problems:   Nausea and vomiting   Essential hypertension   Controlled type 2 diabetes mellitus without complication ([place])   1. Mild to moderate diffuse colitis. The patient's CT scan on admission revealed mild to moderate diffuse colitis, without complete dictating features. He presented with nausea, vomiting, and minimal abdominal pain. He denied diarrhea. On admission, he was afebrile and hemodynamically stable. -Patient was started on IV Cipro and Flagyl. Zofran and Phenergan were ordered as needed for nausea/vomiting. IV fluids were ordered. -He was started on a clear liquid diet which he is tolerating. We'll hold off on advancing for now.  Type 2 diabetes mellitus. The patient is treated chronically with insulin. He was started on sliding-scale NovoLog. His CBG was initially  above 200, but has improved.  Hypertension. The patient is treated chronically with Benicar/HCTZ. HCTZ is being withheld due to mild volume depletion from the colitis. Benicar was restarted (Avapro substituted). His blood pressure has been controlled.  Hypokalemia. The patient's potassium fell from 3.5 on admission to 3.2. Will replete with potassium chloride added to the IV fluids rather than orally due to his nausea. We'll order a magnesium level for further evaluation.    Time spent: 35 minutes.    [facility] Pager [phone]. If 7PM-7AM, please contact night-coverage at [email or web address], password [email or web address] 03/24/2015, 11:44 AM

## 2015-03-24 NOTE — Care Management Note (Signed)
Case Management Note  Patient Details  Name: Travis Sharp MRN: [id number] Date of Birth: 1955-12-03  Subjective/Objective:                  Pt admitted from home with colitis. Pt lives with his wife and will return home at discharge. Pt is independent with ADL's. Pt has a glucometer for home use.  Action/Plan: No CM needs noted.  Expected Discharge Date:  03/26/15               Expected Discharge Plan:  Home/Self Care  In-House Referral:  NA  Discharge planning Services  CM Consult  Post Acute Care Choice:  NA Choice offered to:  NA  DME Arranged:    DME Agency:     HH Arranged:    HH Agency:     Status of Service:  Completed, signed off  Medicare Important Message Given:    Date Medicare IM Given:    Medicare IM give by:    Date Additional Medicare IM Given:    Additional Medicare Important Message give by:     If discussed at [place] of Stay Meetings, dates discussed:    Additional Comments:  Joylene Draft, RN 03/24/2015, 11:03 AM

## 2015-03-25 DIAGNOSIS — E860 Dehydration: Secondary | ICD-10-CM | POA: Diagnosis present

## 2015-03-25 DIAGNOSIS — I10 Essential (primary) hypertension: Secondary | ICD-10-CM | POA: Diagnosis present

## 2015-03-25 DIAGNOSIS — Z794 Long term (current) use of insulin: Secondary | ICD-10-CM | POA: Diagnosis not present

## 2015-03-25 DIAGNOSIS — E876 Hypokalemia: Secondary | ICD-10-CM | POA: Diagnosis not present

## 2015-03-25 DIAGNOSIS — K5900 Constipation, unspecified: Secondary | ICD-10-CM | POA: Diagnosis present

## 2015-03-25 DIAGNOSIS — K529 Noninfective gastroenteritis and colitis, unspecified: Secondary | ICD-10-CM | POA: Diagnosis present

## 2015-03-25 DIAGNOSIS — R112 Nausea with vomiting, unspecified: Secondary | ICD-10-CM | POA: Diagnosis present

## 2015-03-25 DIAGNOSIS — Z87891 Personal history of nicotine dependence: Secondary | ICD-10-CM | POA: Diagnosis not present

## 2015-03-25 DIAGNOSIS — E119 Type 2 diabetes mellitus without complications: Secondary | ICD-10-CM | POA: Diagnosis present

## 2015-03-25 DIAGNOSIS — T378X5A Adverse effect of other specified systemic anti-infectives and antiparasitics, initial encounter: Secondary | ICD-10-CM | POA: Diagnosis present

## 2015-03-25 DIAGNOSIS — R1310 Dysphagia, unspecified: Secondary | ICD-10-CM | POA: Diagnosis present

## 2015-03-25 DIAGNOSIS — Z23 Encounter for immunization: Secondary | ICD-10-CM | POA: Diagnosis not present

## 2015-03-25 LAB — URINALYSIS, ROUTINE W REFLEX MICROSCOPIC
Bilirubin Urine: NEGATIVE
Glucose, UA: NEGATIVE mg/dL
Hgb urine dipstick: NEGATIVE
Nitrite: POSITIVE — AB
Specific Gravity, Urine: >1.03 — ABNORMAL HIGH (ref 1.005–1.030)
pH: 6.5 (ref 5.0–8.0)

## 2015-03-25 LAB — GLUCOSE, CAPILLARY
Glucose-Capillary: 128 mg/dL — ABNORMAL HIGH (ref 65–99)
Glucose-Capillary: 143 mg/dL — ABNORMAL HIGH (ref 65–99)
Glucose-Capillary: 120 mg/dL — ABNORMAL HIGH (ref 65–99)
Glucose-Capillary: 132 mg/dL — ABNORMAL HIGH (ref 65–99)

## 2015-03-25 LAB — URINE MICROSCOPIC-ADD ON: Squamous Epithelial / LPF: NONE SEEN

## 2015-03-25 MED ORDER — IRBESARTAN 75 MG PO TABS
75.0000 mg | ORAL_TABLET | Freq: Every day | ORAL | Status: DC
  Administered 2015-03-25 – 2015-03-27 (×3): 75 mg via ORAL
  Filled 2015-03-25 (×3): qty 1

## 2015-03-25 MED ORDER — FAMOTIDINE IN NACL 20-0.9 MG/50ML-% IV SOLN
20.0000 mg | Freq: Two times a day (BID) | INTRAVENOUS | Status: DC
  Administered 2015-03-25 – 2015-03-26 (×3): 20 mg via INTRAVENOUS
  Filled 2015-03-25 (×7): qty 50

## 2015-03-25 MED ORDER — ONDANSETRON HCL 4 MG/2ML IJ SOLN
4.0000 mg | Freq: Four times a day (QID) | INTRAMUSCULAR | Status: DC
  Administered 2015-03-25 – 2015-03-27 (×9): 4 mg via INTRAVENOUS
  Filled 2015-03-25 (×7): qty 2

## 2015-03-25 MED ORDER — SENNOSIDES-DOCUSATE SODIUM 8.6-50 MG PO TABS
2.0000 | ORAL_TABLET | Freq: Every evening | ORAL | Status: DC | PRN
  Administered 2015-03-25: 2 via ORAL
  Filled 2015-03-25: qty 2

## 2015-03-25 MED ORDER — FAMOTIDINE IN NACL 20-0.9 MG/50ML-% IV SOLN
INTRAVENOUS | Status: AC
  Filled 2015-03-25: qty 50

## 2015-03-25 NOTE — Progress Notes (Signed)
[facility] PROGRESS NOTE  Travis Sharp [id number] DOB: Sep 13, 1955 DOA: 03/23/2015 PCP: Collene Mares, PA-C    Code Status: Full code Family Communication: Discussed with wife. Disposition Plan: Discharge when clinically appropriate, likely in the next 2-3 days.   Consultants:  None  Procedures:  None  Antibiotics:  Cipro 03/23/15>>  Flagyl 03/23/15>> 03/25/15  HPI/Subjective: Patient's wife reported that the patient had several episodes of nausea and vomiting overnight. The patient acknowledges this. He denies abdominal pain.  Objective: Filed Vitals:   03/24/15 2224 03/25/15 0516  BP: 125/83 116/72  Pulse: 80 72  Temp: 99.7 F (37.6 C) 99.1 F (37.3 C)  Resp: 20 18   oxygen saturation 98 percent on room air.  Intake/Output Summary (Last 24 hours) at 03/25/15 1438 Last data filed at 03/25/15 0519  Gross per 24 hour  Intake 2326.25 ml  Output      1 ml  Net 2325.25 ml   Filed Weights   03/23/15 0707 03/23/15 1755  Weight: 81.647 kg (180 lb) 81.194 kg (179 lb)    Exam:   General:  59 year old man who appears ill, but in no acute distress.  Cardiovascular: S1, S2, with no murmurs rubs or gallops.   Respiratory: Clear to auscultation bilaterally.   Abdomen: Positive bowel sounds, soft, mildly tender diffusely with no distention or masses palpated.  Musculoskeletal/extremities: No pedal edema. No acute hot red joints.   Data Reviewed: Basic Metabolic Panel:  Recent Labs Lab 03/23/15 0708 03/23/15 1946 03/24/15 0627  NA 134*  --  137  K 3.5  --  3.2*  CL 96*  --  101  CO2 26  --  28  GLUCOSE 251*  --  161*  BUN 20  --  15  CREATININE 0.87  --  0.91  CALCIUM 9.8  --  9.0  MG  --  1.8  --   PHOS  --  4.0  --    Liver Function Tests:  Recent Labs Lab 03/23/15 0708  AST 19  ALT 24  ALKPHOS 55  BILITOT 1.8*  PROT 8.5*  ALBUMIN 4.9    Recent Labs Lab 03/23/15 0708  LIPASE 45   No results for input(s): AMMONIA in the  last 168 hours. CBC:  Recent Labs Lab 03/23/15 0708 03/24/15 0627  WBC 10.0 8.9  NEUTROABS 7.6  --   HGB 18.3* 16.2  HCT 49.7 43.4  MCV 83.4 84.8  PLT 259 220   Cardiac Enzymes: No results for input(s): CKTOTAL, CKMB, CKMBINDEX, TROPONINI in the last 168 hours. BNP (last 3 results) No results for input(s): BNP in the last 8760 hours.  ProBNP (last 3 results) No results for input(s): PROBNP in the last 8760 hours.  CBG:  Recent Labs Lab 03/24/15 1136 03/24/15 1635 03/24/15 2222 03/25/15 0814 03/25/15 1143  GLUCAP 146* 133* 177* 128* 143*    No results found for this or any previous visit (from the past 240 hour(s)).   Studies: No results found.  Scheduled Meds: . ciprofloxacin  400 mg Intravenous Q12H  . famotidine (PEPCID) IV  20 mg Intravenous Q12H  . heparin  5,000 Units Subcutaneous 3 times per day  . insulin aspart  0-5 Units Subcutaneous QHS  . insulin aspart  0-9 Units Subcutaneous TID WC  . insulin detemir  10 Units Subcutaneous QHS  . irbesartan  75 mg Oral Daily  . ondansetron (ZOFRAN) IV  4 mg Intravenous 4 times per day  . sodium chloride  3 mL  Intravenous Q12H   Continuous Infusions: . 0.9 % NaCl with KCl 40 mEq / L 75 mL/hr (03/25/15 0519)   Assessment and plan: Principal Problem:   Colitis Active Problems:   Nausea and vomiting   Essential hypertension   Controlled type 2 diabetes mellitus without complication (H[facility])   Hypokalemia   1. Mild to moderate diffuse colitis. The patient's CT scan on admission revealed mild to moderate diffuse colitis, without complete dictating features. He presented with nausea, vomiting, and minimal abdominal pain. He denied diarrhea. On admission, he was afebrile and hemodynamically stable. -Patient was started on IV Cipro and Flagyl. Zofran and Phenergan were ordered as needed for nausea/vomiting. IV fluids were ordered. -He was started on a clear liquid diet which he had been tolerating. -The nausea and  vomiting could be secondary to metronidazole, so will be discontinued. -Urinalysis was ordered and was essentially negative. -We'll IV Pepcid. We'll start Zofran scheduled every 6 hours. We'll continue when necessary Phenergan.  Type 2 diabetes mellitus. The patient is treated chronically with insulin. He was started on sliding-scale NovoLog. His CBG was initially above 200, but has improved.  Hypertension. The patient is treated chronically with Benicar/HCTZ. HCTZ is being withheld due to mild volume depletion from the colitis. Benicar was restarted (Avapro substituted). His blood pressure has been controlled.  Hypokalemia. The patient's potassium fell from 3.5 on admission to 3.2. -Potassium chloride was added to the IV fluids rather than giving it to him orally because of nausea/vomiting. His magnesium level was within normal limits at 1.8. -We'll recheck basic metabolic panel tomorrow.    Time spent: 30 minutes.    [facility] Pager [phone]. If 7PM-7AM, please contact night-coverage at [email or web address], password [facility] 03/25/2015, 2:38 PM  LOS: 0 days

## 2015-03-26 LAB — GLUCOSE, CAPILLARY
Glucose-Capillary: 143 mg/dL — ABNORMAL HIGH (ref 65–99)
Glucose-Capillary: 141 mg/dL — ABNORMAL HIGH (ref 65–99)
Glucose-Capillary: 111 mg/dL — ABNORMAL HIGH (ref 65–99)
Glucose-Capillary: 132 mg/dL — ABNORMAL HIGH (ref 65–99)

## 2015-03-26 LAB — COMPREHENSIVE METABOLIC PANEL
ALT: 17 U/L (ref 17–63)
AST: 17 U/L (ref 15–41)
Albumin: 3.9 g/dL (ref 3.5–5.0)
Alkaline Phosphatase: 42 U/L (ref 38–126)
Anion gap: 5 (ref 5–15)
BUN: 12 mg/dL (ref 6–20)
CO2: 27 mmol/L (ref 22–32)
Calcium: 8.9 mg/dL (ref 8.9–10.3)
Chloride: 103 mmol/L (ref 101–111)
Creatinine, Ser: 0.95 mg/dL (ref 0.61–1.24)
GFR calc Af Amer: >60 mL/min (ref >60)
GFR calc non Af Amer: >60 mL/min (ref >60)
Glucose, Bld: 161 mg/dL — ABNORMAL HIGH (ref 65–99)
Potassium: 4.2 mmol/L (ref 3.5–5.1)
Sodium: 135 mmol/L (ref 135–145)
Total Bilirubin: 1.4 mg/dL — ABNORMAL HIGH (ref 0.3–1.2)
Total Protein: 6.8 g/dL (ref 6.5–8.1)

## 2015-03-26 LAB — CBC
HCT: 42.5 % (ref 39.0–52.0)
Hemoglobin: 15.4 g/dL (ref 13.0–17.0)
MCH: 30.7 pg (ref 26.0–34.0)
MCHC: 36.2 g/dL — ABNORMAL HIGH (ref 30.0–36.0)
MCV: 84.8 fL (ref 78.0–100.0)
Platelets: 196 10*3/uL (ref 150–400)
RBC: 5.01 MIL/uL (ref 4.22–5.81)
RDW: 12.7 % (ref 11.5–15.5)
WBC: 7.9 10*3/uL (ref 4.0–10.5)

## 2015-03-26 MED ORDER — FAMOTIDINE 20 MG PO TABS
20.0000 mg | ORAL_TABLET | Freq: Every day | ORAL | Status: DC
  Administered 2015-03-26 – 2015-03-27 (×2): 20 mg via ORAL
  Filled 2015-03-26 (×2): qty 1

## 2015-03-26 MED ORDER — POLYETHYLENE GLYCOL 3350 17 G PO PACK
17.0000 g | PACK | Freq: Every day | ORAL | Status: DC
Start: 2015-03-26 — End: 2015-03-27
  Administered 2015-03-26 – 2015-03-27 (×2): 17 g via ORAL
  Filled 2015-03-26 (×2): qty 1

## 2015-03-26 MED ORDER — CIPROFLOXACIN HCL 250 MG PO TABS
500.0000 mg | ORAL_TABLET | Freq: Two times a day (BID) | ORAL | Status: DC
  Administered 2015-03-27: 500 mg via ORAL
  Filled 2015-03-26: qty 2

## 2015-03-26 MED ORDER — ZOLPIDEM TARTRATE 5 MG PO TABS
5.0000 mg | ORAL_TABLET | Freq: Once | ORAL | Status: AC
  Administered 2015-03-26: 5 mg via ORAL
  Filled 2015-03-26: qty 1

## 2015-03-26 NOTE — Progress Notes (Addendum)
[facility] PROGRESS NOTE  Travis Sharp [id number] DOB: Feb 16, 1956 DOA: 03/23/2015 PCP: Collene Mares, PA-C    Code Status: Full code Family Communication: Discussed with wife. Disposition Plan: Discharge when clinically appropriate, likely in the next 1-2   Consultants:  None  Procedures:  None  Antibiotics:  Cipro 03/23/15>>  Flagyl 03/23/15>> 03/25/15  HPI/Subjective: Patient has significantly less nausea. Once his diet advanced. He denies abdominal pain. He requests something for perceived constipation.  Objective: Filed Vitals:   03/25/15 2131 03/26/15 0527  BP: 141/93 138/97  Pulse: 82 74  Temp: 98.7 F (37.1 C) 97.7 F (36.5 C)  Resp: 20 20   oxygen saturation 100 percent on room air.  Intake/Output Summary (Last 24 hours) at 03/26/15 1318 Last data filed at 03/26/15 0908  Gross per 24 hour  Intake    360 ml  Output    100 ml  Net    260 ml   Filed Weights   03/23/15 0707 03/23/15 1755  Weight: 81.647 kg (180 lb) 81.194 kg (179 lb)    Exam:   General:  59 year old man who looks much better.  Cardiovascular: S1, S2, with no murmurs rubs or gallops.   Respiratory: Clear to auscultation bilaterally.   Abdomen: Positive bowel sounds, soft, nontender, nondistended.  Musculoskeletal/extremities: No pedal edema. No acute hot red joints.   Data Reviewed: Basic Metabolic Panel:  Recent Labs Lab 03/23/15 0708 03/23/15 1946 03/24/15 0627 03/26/15 0514  NA 134*  --  137 135  K 3.5  --  3.2* 4.2  CL 96*  --  101 103  CO2 26  --  28 27  GLUCOSE 251*  --  161* 161*  BUN 20  --  15 12  CREATININE 0.87  --  0.91 0.95  CALCIUM 9.8  --  9.0 8.9  MG  --  1.8  --   --   PHOS  --  4.0  --   --    Liver Function Tests:  Recent Labs Lab 03/23/15 0708 03/26/15 0514  AST 19 17  ALT 24 17  ALKPHOS 55 42  BILITOT 1.8* 1.4*  PROT 8.5* 6.8  ALBUMIN 4.9 3.9    Recent Labs Lab 03/23/15 0708  LIPASE 45   No results for input(s):  AMMONIA in the last 168 hours. CBC:  Recent Labs Lab 03/23/15 0708 03/24/15 0627 03/26/15 0514  WBC 10.0 8.9 7.9  NEUTROABS 7.6  --   --   HGB 18.3* 16.2 15.4  HCT 49.7 43.4 42.5  MCV 83.4 84.8 84.8  PLT 259 220 196   Cardiac Enzymes: No results for input(s): CKTOTAL, CKMB, CKMBINDEX, TROPONINI in the last 168 hours. BNP (last 3 results) No results for input(s): BNP in the last 8760 hours.  ProBNP (last 3 results) No results for input(s): PROBNP in the last 8760 hours.  CBG:  Recent Labs Lab 03/25/15 1143 03/25/15 1635 03/25/15 2044 03/26/15 0800 03/26/15 1156  GLUCAP 143* 120* 132* 143* 141*    No results found for this or any previous visit (from the past 240 hour(s)).   Studies: No results found.  Scheduled Meds: . ciprofloxacin  400 mg Intravenous Q12H  . famotidine (PEPCID) IV  20 mg Intravenous Q12H  . heparin  5,000 Units Subcutaneous 3 times per day  . insulin aspart  0-5 Units Subcutaneous QHS  . insulin aspart  0-9 Units Subcutaneous TID WC  . insulin detemir  10 Units Subcutaneous QHS  . irbesartan  75  mg Oral Daily  . ondansetron (ZOFRAN) IV  4 mg Intravenous 4 times per day  . polyethylene glycol  17 g Oral Daily  . sodium chloride  3 mL Intravenous Q12H   Continuous Infusions: . 0.9 % NaCl with KCl 40 mEq / L 75 mL/hr (03/26/15 1256)   Assessment and plan: Principal Problem:   Colitis Active Problems:   Nausea and vomiting   Essential hypertension   Controlled type 2 diabetes mellitus without complication (HCC)   Hypokalemia   1. Mild to moderate diffuse colitis. The patient's CT scan on admission revealed mild to moderate diffuse colitis, without complicating features. He presented with nausea, vomiting, and minimal abdominal pain. He denied diarrhea. On admission, he was afebrile and hemodynamically stable. -Patient was started on IV Cipro and Flagyl. Zofran and Phenergan were ordered as needed for nausea/vomiting. IV Pepcid was also  added. Zofran was changed to scheduled every 6 hours. IV fluids were ordered. -He was started on a clear liquid diet. -The persistent nausea and vomiting was likely secondary to metronidazole, so it was discontinued. -Urinalysis was ordered and was essentially negative. -He has improved clinically and symptomatically, so will advance his diet as tolerated. -MiraLAX was added for perceived constipation. -We'll change Cipro to by mouth.  Type 2 diabetes mellitus. The patient is treated chronically with insulin. He was started on sliding-scale NovoLog. His CBG was initially above 200, but has improved.  Hypertension. The patient is treated chronically with Benicar/HCTZ. HCTZ is being withheld due to mild volume depletion from the colitis. Benicar was restarted (Avapro substituted). His blood pressure has been controlled.  Hypokalemia. The patient's potassium fell from 3.5 on admission to 3.2. -Potassium chloride was added to the IV fluids rather than giving it to him orally because of nausea/vomiting. His magnesium level was within normal limits at 1.8. -His serum potassium has improved.    Time spent: 30 minutes.    [facility] Pager [phone]. If 7PM-7AM, please contact night-coverage at [email or web address], password [facility] 03/26/2015, 1:18 PM  LOS: 1 day

## 2015-03-27 ENCOUNTER — Telehealth: Payer: Self-pay | Admitting: Gastroenterology

## 2015-03-27 ENCOUNTER — Encounter (HOSPITAL_COMMUNITY): Payer: Self-pay | Admitting: Internal Medicine

## 2015-03-27 LAB — GLUCOSE, CAPILLARY
Glucose-Capillary: 128 mg/dL — ABNORMAL HIGH (ref 65–99)
Glucose-Capillary: 148 mg/dL — ABNORMAL HIGH (ref 65–99)

## 2015-03-27 MED ORDER — PROMETHAZINE HCL 12.5 MG PO TABS: 12.5000 mg | ORAL_TABLET | Freq: Four times a day (QID) | ORAL | Status: DC | PRN

## 2015-03-27 MED ORDER — BENICAR HCT 40-25 MG PO TABS: 1.0000 | ORAL_TABLET | Freq: Every day | ORAL | Status: DC

## 2015-03-27 MED ORDER — CIPROFLOXACIN HCL 500 MG PO TABS: 500.0000 mg | ORAL_TABLET | Freq: Two times a day (BID) | ORAL | Status: DC

## 2015-03-27 MED ORDER — FAMOTIDINE 10 MG PO TABS: 20.0000 mg | ORAL_TABLET | Freq: Every day | ORAL | Status: DC

## 2015-03-27 NOTE — Progress Notes (Signed)
Pt complained of N/V - relieved by antiemetic.  Also requested sleeping pill.

## 2015-03-27 NOTE — Discharge Summary (Signed)
Physician Discharge Summary  Travis Sharp [id number] DOB: 09-26-1955 DOA: 03/23/2015  PCP: Collene Mares, PA-C  Admit date: 03/23/2015 Discharge date: 03/27/2015  Time spent: Greater than 30 minutes  Recommendations for Outpatient Follow-up:  1. Patient was referred to [facility] for outpatient evaluation of pill dysphagia and colitis.   Discharge Diagnoses:  1. Mild to moderate diffuse colitis, without complicating features radiographically. 2. Persistent nausea and vomiting, thought to be secondary to metronidazole. 3. Pill dysphagia. 4. Type 2 diabetes mellitus. 5. Essential hypertension. 6. Hypokalemia.  Discharge Condition: Improved.  Diet recommendation: Full liquids and soft.  Filed Weights   03/23/15 0707 03/23/15 1755  Weight: 81.647 kg (180 lb) 81.194 kg (179 lb)    History of present illness:  The patient is a 59 year old man with a history of type 2 diabetes mellitus and hypertension, who presented to the ED on 03/23/2015 with a chief complaint of nausea and vomiting. CT of his abdomen and pelvis revealed diffuse colitis. He was admitted for further evaluation and management.  Hospital Course:   1. Mild to moderate diffuse colitis. The patient's CT scan on admission revealed mild to moderate diffuse colitis, without complicating features. He presented with nausea, vomiting, and minimal abdominal pain. He denied diarrhea. On admission, he was afebrile and hemodynamically stable. -Patient was started on IV Cipro and Flagyl. Zofran and Phenergan were ordered as needed for nausea/vomiting. IV Pepcid was also added. Zofran was changed to scheduled every 6 hours. IV fluids were ordered. -He was started on a clear liquid diet. -The persistent nausea and vomiting was likely secondary to metronidazole, so it was discontinued. -Urinalysis was ordered and was essentially negative. -He complained of symptomatic constipation, so MiraLAX was ordered. He did  have a large bowel movement. -He improved clinically and symptomatically. His diet was advanced which he tolerated well, but he did mention some chronic dysphagia with pills and "tough" meat. -Cipro was transitioned to by mouth. He was discharged on 3 more days of therapy. -There was no inpatient gastroenterology consult requested, but I did ask nurse practitioner, Ms. Sams from [facility] to speak with the patient about arranging an outpatient colonoscopy and EGD. The patient will call for an appointment.  Type 2 diabetes mellitus. The patient is treated chronically with insulin. He was started on sliding-scale NovoLog. His CBG was initially above 200, but improved.  Hypertension. The patient is treated chronically with Benicar/HCTZ. HCTZ was held due to mild volume depletion from the colitis. Benicar was restarted (Avapro substituted). His blood pressure was controlled.  Hypokalemia. The patient's potassium fell from 3.5 on admission to 3.2. -Potassium chloride was added to the IV fluids rather than giving it to him orally because of nausea/vomiting. His magnesium level was within normal limits at 1.8. -His serum potassium improved.  Procedures:  None  Consultations:  None  Discharge Exam: Filed Vitals:   03/26/15 2211 03/27/15 0553  BP: 133/76 107/88  Pulse: 77 74  Temp: 98.2 F (36.8 C) 98.5 F (36.9 C)  Resp: 20 20    General: Pleasant 59 year old African-American man in no acute distress. He looks much better. Oropharynx reveals no posterior pharyngeal exudates or erythema or edema. Cardiovascular: S1, S2, no murmurs rubs or gallops. Respiratory: Clear to auscultation bilaterally. Abdomen: Positive bowel sounds, soft, nontender, nondistended.  Discharge Instructions   Discharge Instructions    Diet - low sodium heart healthy    Complete by:  As directed      Diet Carb Modified  Complete by:  As directed      Discharge instructions    Complete  by:  As directed   EAT SOFT, NON-FRIED FOODS AND SOUPS FOR AT LEAST THE NEXT 3 DAYS.     Increase activity slowly    Complete by:  As directed           Current Discharge Medication List    START taking these medications   Details  ciprofloxacin (CIPRO) 500 MG tablet Take 1 tablet (500 mg total) by mouth 2 (two) times daily. STARTING TOMORROW, TAKE ANTIBIOTIC AS PRESCRIBED UNTIL COMPLETED. Qty: 6 tablet, Refills: 0    famotidine (PEPCID) 10 MG tablet Take 2 tablets (20 mg total) by mouth daily.    promethazine (PHENERGAN) 12.5 MG tablet Take 1 tablet (12.5 mg total) by mouth every 6 (six) hours as needed for nausea or vomiting. Qty: 30 tablet, Refills: 0      CONTINUE these medications which have CHANGED   Details  BENICAR HCT 40-25 MG tablet Take 1 tablet by mouth daily. RESTART IN 2 DAYS.      CONTINUE these medications which have NOT CHANGED   Details  Insulin Degludec (TRESIBA FLEXTOUCH) 100 UNIT/ML SOPN Inject 22 Units into the skin daily.    insulin detemir (LEVEMIR) 100 UNIT/ML injection Inject 20 Units into the skin at bedtime.      STOP taking these medications     ibuprofen (ADVIL,MOTRIN) 200 MG tablet      olmesartan (BENICAR) 40 MG tablet        No Known Allergies Follow-up Information    Follow up with MANN, BENJAMIN, PA-C. Schedule an appointment as soon as possible for a visit in 1 week.   Specialties:  Physician Assistant, Internal Medicine   Contact information:   [address] [place] [id number] [phone]       Schedule an appointment as soon as possible for a visit with Laban Emperor, NP.   Specialty:  Gastroenterology   Why:  Nurse Practioner for GI. Call for an appointment to be seen in 2-4 weeks   Contact information:   [address] [place] [address] [phone]        The results of significant diagnostics from this hospitalization (including imaging, microbiology, ancillary and laboratory) are listed below for  reference.    Significant Diagnostic Studies: Dg Chest 2 View  03/23/2015  CLINICAL DATA:  Weight loss.  Former smoker. EXAM: CHEST  2 VIEW COMPARISON:  09/26/2005 FINDINGS: Normal heart size and mediastinal contours. No acute infiltrate or edema. No effusion or pneumothorax. No acute osseous findings. IMPRESSION: No acute finding or change from 2007. Electronically Signed   By: Monte Fantasia M.D.   On: 03/23/2015 08:04   Ct Abdomen Pelvis W Contrast  03/23/2015  CLINICAL DATA:  59 year old male with 1 month of left side abdominal pain. Black stools. Initial encounter. EXAM: CT ABDOMEN AND PELVIS WITH CONTRAST TECHNIQUE: Multidetector CT imaging of the abdomen and pelvis was performed using the standard protocol following bolus administration of intravenous contrast. CONTRAST:  167mL OMNIPAQUE IOHEXOL 300 MG/ML  SOLN COMPARISON:  None. FINDINGS: Negative lung bases.  No pericardial or pleural effusion. No acute osseous abnormality identified. No pelvic free fluid.  Unremarkable urinary bladder. The colon is mostly decompressed throughout the abdomen and appears mildly to moderately thick walled throughout. This is most apparent in the sigmoid colon (wall thickness up to 10 mm). There is generalized mild mesenteric stranding/congestion. The appendix is normal. No diverticulosis  identified. No pneumoperitoneum. No abdominal free fluid. No dilated or abnormal small bowel loops. Small volume of oral contrast in the stomach, duodenum and jejunum. Liver, gallbladder, spleen, pancreas, and adrenal glands are within normal limits. Portal venous system within normal limits. Bilateral renal enhancement and contrast excretion within normal limits. Aortoiliac calcified atherosclerosis noted. Major arterial structures are patent. No lymphadenopathy. IMPRESSION: 1. Mild-to-moderate diffuse colitis suspected. No associated free fluid or complicating features. 2. No other acute findings.  Calcified aortic atherosclerosis.  Electronically Signed   By: Genevie Ann M.D.   On: 03/23/2015 11:41    Microbiology: No results found for this or any previous visit (from the past 240 hour(s)).   Labs: Basic Metabolic Panel:  Recent Labs Lab 03/23/15 0708 03/23/15 1946 03/24/15 0627 03/26/15 0514  NA 134*  --  137 135  K 3.5  --  3.2* 4.2  CL 96*  --  101 103  CO2 26  --  28 27  GLUCOSE 251*  --  161* 161*  BUN 20  --  15 12  CREATININE 0.87  --  0.91 0.95  CALCIUM 9.8  --  9.0 8.9  MG  --  1.8  --   --   PHOS  --  4.0  --   --    Liver Function Tests:  Recent Labs Lab 03/23/15 0708 03/26/15 0514  AST 19 17  ALT 24 17  ALKPHOS 55 42  BILITOT 1.8* 1.4*  PROT 8.5* 6.8  ALBUMIN 4.9 3.9    Recent Labs Lab 03/23/15 0708  LIPASE 45   No results for input(s): AMMONIA in the last 168 hours. CBC:  Recent Labs Lab 03/23/15 0708 03/24/15 0627 03/26/15 0514  WBC 10.0 8.9 7.9  NEUTROABS 7.6  --   --   HGB 18.3* 16.2 15.4  HCT 49.7 43.4 42.5  MCV 83.4 84.8 84.8  PLT 259 220 196   Cardiac Enzymes: No results for input(s): CKTOTAL, CKMB, CKMBINDEX, TROPONINI in the last 168 hours. BNP: BNP (last 3 results) No results for input(s): BNP in the last 8760 hours.  ProBNP (last 3 results) No results for input(s): PROBNP in the last 8760 hours.  CBG:  Recent Labs Lab 03/26/15 1156 03/26/15 1711 03/26/15 2054 03/27/15 0747 03/27/15 1133  GLUCAP 141* 111* 132* 128* 148*       Signed:  Emilyrose Darrah  [facility] 03/27/2015, 3:28 PM

## 2015-03-27 NOTE — Progress Notes (Signed)
Patient alert and oriented, independent, VSS, pt. Tolerating diet well. No complaints of pain or nausea. Pt. Had IV removed tip intact. Pt. Had prescriptions given. Pt. Voiced understanding of discharge instructions with no further questions. Pt. Discharged via wheelchair with auxilliary.  

## 2015-03-27 NOTE — Telephone Encounter (Signed)
Inpatient with colitis, notes pill dysphagia. We were not officially consulted, but patient needs to have routine follow-up with [place] as an outpatient to arrange a colonoscopy and EGD. I spoke with patient at bedside and informed him we would be calling with an appt.   Orvil Feil, ANP-BC [facility]

## 2015-03-28 ENCOUNTER — Encounter: Payer: Self-pay | Admitting: Gastroenterology

## 2015-03-28 NOTE — Telephone Encounter (Signed)
APPOINTMENT MADE AND LETTER SENT °

## 2015-03-31 ENCOUNTER — Encounter: Payer: Self-pay | Admitting: Gastroenterology

## 2015-04-26 ENCOUNTER — Encounter: Payer: Self-pay | Admitting: Gastroenterology

## 2015-04-26 ENCOUNTER — Other Ambulatory Visit: Payer: Self-pay

## 2015-04-26 ENCOUNTER — Ambulatory Visit (INDEPENDENT_AMBULATORY_CARE_PROVIDER_SITE_OTHER): Payer: BLUE CROSS/BLUE SHIELD | Admitting: Gastroenterology

## 2015-04-26 VITALS — BP 137/85 | HR 87 | Temp 97.4°F | Ht 70.0 in | Wt 165.2 lb

## 2015-04-26 DIAGNOSIS — K219 Gastro-esophageal reflux disease without esophagitis: Secondary | ICD-10-CM | POA: Diagnosis not present

## 2015-04-26 DIAGNOSIS — K529 Noninfective gastroenteritis and colitis, unspecified: Secondary | ICD-10-CM

## 2015-04-26 DIAGNOSIS — R1310 Dysphagia, unspecified: Secondary | ICD-10-CM | POA: Diagnosis not present

## 2015-04-26 MED ORDER — NA SULFATE-K SULFATE-MG SULF 17.5-3.13-1.6 GM/177ML PO SOLN: 1.0000 | Freq: Once | ORAL | Status: DC

## 2015-04-26 MED ORDER — ONDANSETRON HCL 4 MG PO TABS: 4.0000 mg | ORAL_TABLET | Freq: Three times a day (TID) | ORAL | Status: DC | PRN

## 2015-04-26 MED ORDER — PANTOPRAZOLE SODIUM 40 MG PO TBEC: 40.0000 mg | DELAYED_RELEASE_TABLET | Freq: Every day | ORAL | Status: DC

## 2015-04-26 NOTE — Patient Instructions (Signed)
We have scheduled you for a colonoscopy, upper endoscopy, and dilation with Dr. Oneida Alar.   Start taking Protonix once each day, 30 minutes before breakfast. I sent this to the pharmacy.   I have also sent a nausea medication to the pharmacy called Zofran.  Finally, you may start taking Linzess 1 capsule each morning, 30 minutes before breakfast. This may give you loose stool the first few days, but it should get better. Let me know if you would like a complete prescription for this.

## 2015-04-26 NOTE — Progress Notes (Signed)
Primary Care Physician:  Collene Mares, PA-C Primary Gastroenterologist:  Dr. Oneida Alar   Chief Complaint  Patient presents with  . EGD  . Colonoscopy    HPI:   Travis Sharp is a 60 y.o. male presenting today at the request of [facility] hospitalist due to recent admission for colitis and pill dysphagia. We were not formally consulted during the admission Dec 2016. He has never had a colonoscopy. No prior endoscopy.   Notes a sharp pain in rectum intermittently for about a week. Sometimes painful with bowel movements. No rectal bleeding. Notes constipation. Sometimes will go up to 3 days without a BM. Has to force himself to have a BM. A lot of gas.   Will be eating then doesn't want to eat anymore. Starts having nausea, wants to vomit. Notes intermittent solid food dysphagia, specifically with roast beef. Pill dysphagia noted. Large amounts of belching lately. Notes intermittent regurgitation. No PPI. Was prescribed Pepcid at discharge but did not take because he felt better. Greasy foods worsen symptoms. Occasionally a sharp pain after eating, especially sweets, fruits. Doesn't happen often. Intermittent NSAIDs for joint pain.   Reports losing a lot of weight. Used to weigh 200 during the summer. Now 165.   Past Medical History  Diagnosis Date  . Hypertension   . Diabetes mellitus   . Colitis 03/23/2015    Past Surgical History  Procedure Laterality Date  . Tonsillectomy      Current Outpatient Prescriptions  Medication Sig Dispense Refill  . BENICAR HCT 40-25 MG tablet Take 1 tablet by mouth daily. RESTART IN 2 DAYS.    . Insulin Degludec (TRESIBA FLEXTOUCH) 100 UNIT/ML SOPN Inject 22 Units into the skin daily.    . insulin detemir (LEVEMIR) 100 UNIT/ML injection Inject 20 Units into the skin at bedtime.     No current facility-administered medications for this visit.    Allergies as of 04/26/2015  . (No Known Allergies)    Family History  Problem Relation Age of  Onset  . Colon cancer      possibly 2 uncles  . Stroke Mother   . Aneurysm Mother   . Kidney failure Brother   . Heart disease Father     Social History   Social History  . Marital Status: Married    Spouse Name: N/A  . Number of Children: N/A  . Years of Education: N/A   Occupational History  . [employer]    Social History Main Topics  . Smoking status: Former Research scientist (life sciences)  . Smokeless tobacco: Not on file  . Alcohol Use: No  . Drug Use: Yes     Comment: marijuana  . Sexual Activity: Not on file   Other Topics Concern  . Not on file   Social History Narrative    Review of Systems: Gen: waking up with night sweats, feels cold at times. Feels tired.  CV: Denies chest pain, heart palpitations, peripheral edema, syncope.  Resp: Denies shortness of breath at rest or with exertion. Denies wheezing or cough.  GI: see HPI  GU : Denies urinary burning, urinary frequency, urinary hesitancy MS: left leg discomfort  Derm: Denies rash, itching, dry skin Psych: difficulty sleeping  Heme: Denies bruising, bleeding, and enlarged lymph nodes.  Physical Exam: BP 137/85 mmHg  Pulse 87  Temp(Src) 97.4 F (36.3 C) (Oral)  Ht 5\' 10"  (1.778 m)  Wt 165 lb 3.2 oz (74.934 kg)  BMI 23.70 kg/m2 General:   Alert and  oriented. Pleasant and cooperative. Well-nourished and well-developed.  Head:  Normocephalic and atraumatic. Eyes:  Without icterus, sclera clear and conjunctiva pink.  Ears:  Normal auditory acuity. Nose:  No deformity, discharge,  or lesions. Mouth:  No deformity or lesions, oral mucosa pink.  Lungs:  Clear to auscultation bilaterally. No wheezes, rales, or rhonchi. No distress.  Heart:  S1, S2 present without murmurs appreciated.  Abdomen:  +BS, soft, non-tender and non-distended. No HSM noted. No guarding or rebound. No masses appreciated.  Rectal:  Deferred  Msk:  Symmetrical without gross deformities. Normal posture. Extremities:  Without  edema. Neurologic:  Alert  and  oriented x4;  grossly normal neurologically. Skin:  Intact without significant lesions or rashes. Psych:  Alert and cooperative. Normal mood and affect.  Lab Results  Component Value Date   WBC 7.9 03/26/2015   HGB 15.4 03/26/2015   HCT 42.5 03/26/2015   MCV 84.8 03/26/2015   PLT 196 03/26/2015   Lab Results  Component Value Date   ALT 17 03/26/2015   AST 17 03/26/2015   ALKPHOS 42 03/26/2015   BILITOT 1.4* 03/26/2015   Lab Results  Component Value Date   CREATININE 0.95 03/26/2015   BUN 12 03/26/2015   NA 135 03/26/2015   K 4.2 03/26/2015   CL 103 03/26/2015   CO2 27 03/26/2015

## 2015-04-30 NOTE — Assessment & Plan Note (Signed)
Query secondary to uncontrolled GERD, ring, web, stricture, less likely malignancy. EGD with dilation as planned.

## 2015-04-30 NOTE — Assessment & Plan Note (Signed)
60 year old male admitted with colitis in Dec 2016, with no prior colonoscopy. Intermittent proctalgia but no rectal bleeding. Constipation noted. Weight loss concerning, with close to 40 lbs lost since the summer. Needs initial screening colonoscopy due to multiple symptoms and evidence of colitis on CT.   Proceed with colonoscopy with Dr. Oneida Alar in the near future. The risks, benefits, and alternatives have been discussed in detail with the patient. They state understanding and desire to proceed.  PROPOFOL due to history of marijuana Start Linzess 145 mcg daily, samples provided

## 2015-04-30 NOTE — Assessment & Plan Note (Signed)
With associated nausea, belching, regurgitation without PPI currently. Intermittent NSAIDs for joint pain. In presence of diabetes, unable to exclude underlying delayed gastric emptying. Needs PPI therapy and EGD due to dyspepsia and noted solid food/pill dysphagia.   Proceed with upper endoscopy/dilation in the near future with Dr. Oneida Alar. The risks, benefits, and alternatives have been discussed in detail with patient. They have stated understanding and desire to proceed.  PROPOFOL due to marijuana use Start Protonix once daily Zofran scheduled with meals

## 2015-05-02 NOTE — Progress Notes (Signed)
cc'ed to pcp °

## 2015-05-04 NOTE — Patient Instructions (Signed)
67    Your procedure is scheduled on: 05/10/2015  Report to [facility] at  6:15   AM.  Call this number if you have problems the morning of surgery: [phone]   Remember:   Do not drink or eat food:After Midnight.    .  Take these medicines the morning of surgery with A SIP OF WATER: Benicar, Protonix, and Zofran              Take only 1/2 dose of Levemir 10 units night before surgery   Do not wear jewelry, make-up or nail polish.  Do not wear lotions, powders, or perfumes. You may wear deodorant.  Do not shave 48 hours prior to surgery. Men may shave face and neck.  Do not bring valuables to the hospital.  Contacts, dentures or bridgework may not be worn into surgery.  Leave suitcase in the car. After surgery it may be brought to your room.  For patients admitted to the hospital, checkout time is 11:00 AM the day of discharge.   Patients discharged the day of surgery will not be allowed to drive home.  Name and phone number of your driver:    Please read over the following fact sheets that you were given: Pain Booklet, Lab Information and Anesthesia Post-op Instructions   Endoscopy Care After Please read the instructions outlined below and refer to this sheet in the next few weeks. These discharge instructions provide you with general information on caring for yourself after you leave the hospital. Your doctor may also give you specific instructions. While your treatment has been planned according to the most current medical practices available, unavoidable complications occasionally occur. If you have any problems or questions after discharge, please call your doctor. HOME CARE INSTRUCTIONS Activity  You may resume your regular activity but move at a slower pace for the next 24 hours.   Take frequent rest periods for the next 24 hours.   Walking will help expel (get rid of) the air and reduce the bloated feeling in your abdomen.   No driving for 24 hours (because of the  anesthesia (medicine) used during the test).   You may shower.   Do not sign any important legal documents or operate any machinery for 24 hours (because of the anesthesia used during the test).  Nutrition  Drink plenty of fluids.   You may resume your normal diet.   Begin with a light meal and progress to your normal diet.   Avoid alcoholic beverages for 24 hours or as instructed by your caregiver.  Medications You may resume your normal medications unless your caregiver tells you otherwise. What you can expect today  You may experience abdominal discomfort such as a feeling of fullness or "gas" pains.   You may experience a sore throat for 2 to 3 days. This is normal. Gargling with salt water may help this.  Follow-up Your doctor will discuss the results of your test with you. SEEK IMMEDIATE MEDICAL CARE IF:  You have excessive nausea (feeling sick to your stomach) and/or vomiting.   You have severe abdominal pain and distention (swelling).   You have trouble swallowing.   You have a temperature over 100 F (37.8 C).   You have rectal bleeding or vomiting of blood.  Document Released: 11/15/2003 Document Revised: 03/22/2011 Document Reviewed: 05/28/2007 Colonoscopy, Care After Refer to this sheet in the next few weeks. These instructions provide you with information on caring for yourself after your procedure. Your  health care provider may also give you more specific instructions. Your treatment has been planned according to current medical practices, but problems sometimes occur. Call your health care provider if you have any problems or questions after your procedure. WHAT TO EXPECT AFTER THE PROCEDURE  After your procedure, it is typical to have the following:  A small amount of blood in your stool.  Moderate amounts of gas and mild abdominal cramping or bloating. HOME CARE INSTRUCTIONS  Do not drive, operate machinery, or sign important documents for 24  hours.  You may shower and resume your regular physical activities, but move at a slower pace for the first 24 hours.  Take frequent rest periods for the first 24 hours.  Walk around or put a warm pack on your abdomen to help reduce abdominal cramping and bloating.  Drink enough fluids to keep your urine clear or pale yellow.  You may resume your normal diet as instructed by your health care provider. Avoid heavy or fried foods that are hard to digest.  Avoid drinking alcohol for 24 hours or as instructed by your health care provider.  Only take over-the-counter or prescription medicines as directed by your health care provider.  If a tissue sample (biopsy) was taken during your procedure:  Do not take aspirin or blood thinners for 7 days, or as instructed by your health care provider.  Do not drink alcohol for 7 days, or as instructed by your health care provider.  Eat soft foods for the first 24 hours. SEEK MEDICAL CARE IF: You have persistent spotting of blood in your stool 2-3 days after the procedure. SEEK IMMEDIATE MEDICAL CARE IF:  You have more than a small spotting of blood in your stool.  You pass large blood clots in your stool.  Your abdomen is swollen (distended).  You have nausea or vomiting.  You have a fever.  You have increasing abdominal pain that is not relieved with medicine.   This information is not intended to replace advice given to you by your health care provider. Make sure you discuss any questions you have with your health care provider.   Document Released: 11/15/2003 Document Revised: 01/21/2013 Document Reviewed: 12/08/2012 Elsevier Interactive Patient Education [employer].

## 2015-05-05 ENCOUNTER — Other Ambulatory Visit: Payer: Self-pay

## 2015-05-05 ENCOUNTER — Encounter (HOSPITAL_COMMUNITY)
Admission: RE | Admit: 2015-05-05 | Discharge: 2015-05-05 | Disposition: A | Discharge status: Home / Self Care | Payer: BLUE CROSS/BLUE SHIELD | Source: Ambulatory Visit | Attending: Gastroenterology | Admitting: Gastroenterology

## 2015-05-05 ENCOUNTER — Encounter (HOSPITAL_COMMUNITY): Payer: Self-pay

## 2015-05-05 DIAGNOSIS — Z01810 Encounter for preprocedural cardiovascular examination: Secondary | ICD-10-CM | POA: Diagnosis not present

## 2015-05-05 DIAGNOSIS — R1310 Dysphagia, unspecified: Secondary | ICD-10-CM | POA: Insufficient documentation

## 2015-05-05 DIAGNOSIS — Z79899 Other long term (current) drug therapy: Secondary | ICD-10-CM | POA: Insufficient documentation

## 2015-05-05 DIAGNOSIS — K529 Noninfective gastroenteritis and colitis, unspecified: Secondary | ICD-10-CM | POA: Diagnosis not present

## 2015-05-05 DIAGNOSIS — I10 Essential (primary) hypertension: Secondary | ICD-10-CM | POA: Insufficient documentation

## 2015-05-05 DIAGNOSIS — Z01812 Encounter for preprocedural laboratory examination: Secondary | ICD-10-CM | POA: Diagnosis present

## 2015-05-05 DIAGNOSIS — Z794 Long term (current) use of insulin: Secondary | ICD-10-CM | POA: Diagnosis not present

## 2015-05-05 DIAGNOSIS — E119 Type 2 diabetes mellitus without complications: Secondary | ICD-10-CM | POA: Insufficient documentation

## 2015-05-05 LAB — BASIC METABOLIC PANEL
Anion gap: 5 (ref 5–15)
BUN: 18 mg/dL (ref 6–20)
CO2: 31 mmol/L (ref 22–32)
Calcium: 9.8 mg/dL (ref 8.9–10.3)
Chloride: 102 mmol/L (ref 101–111)
Creatinine, Ser: 0.84 mg/dL (ref 0.61–1.24)
GFR calc Af Amer: >60 mL/min (ref >60)
GFR calc non Af Amer: >60 mL/min (ref >60)
Glucose, Bld: 110 mg/dL — ABNORMAL HIGH (ref 65–99)
Potassium: 4.3 mmol/L (ref 3.5–5.1)
Sodium: 138 mmol/L (ref 135–145)

## 2015-05-05 LAB — CBC
HCT: 43.8 % (ref 39.0–52.0)
Hemoglobin: 15.7 g/dL (ref 13.0–17.0)
MCH: 31.2 pg (ref 26.0–34.0)
MCHC: 35.8 g/dL (ref 30.0–36.0)
MCV: 86.9 fL (ref 78.0–100.0)
Platelets: 241 10*3/uL (ref 150–400)
RBC: 5.04 MIL/uL (ref 4.22–5.81)
RDW: 13.3 % (ref 11.5–15.5)
WBC: 7.7 10*3/uL (ref 4.0–10.5)

## 2015-05-09 MED ORDER — KETOROLAC TROMETHAMINE 0.5 % OP SOLN
OPHTHALMIC | Status: AC
  Filled 2015-05-09: qty 5

## 2015-05-09 MED ORDER — CYCLOPENTOLATE-PHENYLEPHRINE OP SOLN OPTIME - NO CHARGE
OPHTHALMIC | Status: AC
  Filled 2015-05-09: qty 2

## 2015-05-09 MED ORDER — TETRACAINE HCL 0.5 % OP SOLN
OPHTHALMIC | Status: AC
  Filled 2015-05-09: qty 4

## 2015-05-09 MED ORDER — PHENYLEPHRINE HCL 2.5 % OP SOLN
OPHTHALMIC | Status: AC
  Filled 2015-05-09: qty 15

## 2015-05-10 ENCOUNTER — Ambulatory Visit (HOSPITAL_COMMUNITY): Payer: BLUE CROSS/BLUE SHIELD | Admitting: Anesthesiology

## 2015-05-10 ENCOUNTER — Encounter (HOSPITAL_COMMUNITY)
Admission: RE | Disposition: A | Discharge status: Home / Self Care | Payer: Self-pay | Source: Ambulatory Visit | Attending: Gastroenterology

## 2015-05-10 ENCOUNTER — Encounter (HOSPITAL_COMMUNITY): Payer: Self-pay | Admitting: *Deleted

## 2015-05-10 ENCOUNTER — Ambulatory Visit (HOSPITAL_COMMUNITY)
Admission: RE | Admit: 2015-05-10 | Discharge: 2015-05-10 | Disposition: A | Discharge status: Home / Self Care | Payer: BLUE CROSS/BLUE SHIELD | Source: Ambulatory Visit | Attending: Gastroenterology | Admitting: Gastroenterology

## 2015-05-10 DIAGNOSIS — K648 Other hemorrhoids: Secondary | ICD-10-CM | POA: Diagnosis not present

## 2015-05-10 DIAGNOSIS — I10 Essential (primary) hypertension: Secondary | ICD-10-CM | POA: Insufficient documentation

## 2015-05-10 DIAGNOSIS — E119 Type 2 diabetes mellitus without complications: Secondary | ICD-10-CM | POA: Diagnosis not present

## 2015-05-10 DIAGNOSIS — Z87891 Personal history of nicotine dependence: Secondary | ICD-10-CM | POA: Insufficient documentation

## 2015-05-10 DIAGNOSIS — Z794 Long term (current) use of insulin: Secondary | ICD-10-CM | POA: Diagnosis not present

## 2015-05-10 DIAGNOSIS — K2950 Unspecified chronic gastritis without bleeding: Secondary | ICD-10-CM | POA: Diagnosis not present

## 2015-05-10 DIAGNOSIS — K2970 Gastritis, unspecified, without bleeding: Secondary | ICD-10-CM

## 2015-05-10 DIAGNOSIS — K5790 Diverticulosis of intestine, part unspecified, without perforation or abscess without bleeding: Secondary | ICD-10-CM | POA: Diagnosis not present

## 2015-05-10 DIAGNOSIS — R634 Abnormal weight loss: Secondary | ICD-10-CM | POA: Insufficient documentation

## 2015-05-10 DIAGNOSIS — R1310 Dysphagia, unspecified: Secondary | ICD-10-CM | POA: Insufficient documentation

## 2015-05-10 DIAGNOSIS — K5730 Diverticulosis of large intestine without perforation or abscess without bleeding: Secondary | ICD-10-CM

## 2015-05-10 DIAGNOSIS — K222 Esophageal obstruction: Secondary | ICD-10-CM | POA: Diagnosis not present

## 2015-05-10 DIAGNOSIS — R933 Abnormal findings on diagnostic imaging of other parts of digestive tract: Secondary | ICD-10-CM | POA: Insufficient documentation

## 2015-05-10 DIAGNOSIS — Z79899 Other long term (current) drug therapy: Secondary | ICD-10-CM | POA: Insufficient documentation

## 2015-05-10 HISTORY — PX: SAVORY DILATION: SHX5439

## 2015-05-10 HISTORY — PX: BIOPSY: SHX5522

## 2015-05-10 HISTORY — PX: ESOPHAGOGASTRODUODENOSCOPY (EGD) WITH PROPOFOL: SHX5813

## 2015-05-10 HISTORY — PX: COLONOSCOPY WITH PROPOFOL: SHX5780

## 2015-05-10 LAB — GLUCOSE, CAPILLARY
Glucose-Capillary: 91 mg/dL (ref 65–99)
Glucose-Capillary: 104 mg/dL — ABNORMAL HIGH (ref 65–99)

## 2015-05-10 SURGERY — COLONOSCOPY WITH PROPOFOL
Anesthesia: Monitor Anesthesia Care

## 2015-05-10 MED ORDER — MIDAZOLAM HCL 2 MG/2ML IJ SOLN
INTRAMUSCULAR | Status: AC
  Filled 2015-05-10: qty 2

## 2015-05-10 MED ORDER — SUCCINYLCHOLINE CHLORIDE 20 MG/ML IJ SOLN
INTRAMUSCULAR | Status: AC
  Filled 2015-05-10: qty 1

## 2015-05-10 MED ORDER — LIDOCAINE HCL (CARDIAC) 10 MG/ML IV SOLN
INTRAVENOUS | Status: DC | PRN
  Administered 2015-05-10: 50 mg via INTRAVENOUS

## 2015-05-10 MED ORDER — GLYCOPYRROLATE 0.2 MG/ML IJ SOLN
INTRAMUSCULAR | Status: AC
  Filled 2015-05-10: qty 1

## 2015-05-10 MED ORDER — LIDOCAINE HCL (PF) 1 % IJ SOLN
INTRAMUSCULAR | Status: AC
  Filled 2015-05-10: qty 5

## 2015-05-10 MED ORDER — PROPOFOL 500 MG/50ML IV EMUL
INTRAVENOUS | Status: DC | PRN
  Administered 2015-05-10: 08:00:00 via INTRAVENOUS
  Administered 2015-05-10: 75 ug/kg/min via INTRAVENOUS

## 2015-05-10 MED ORDER — GLYCOPYRROLATE 0.2 MG/ML IJ SOLN
0.2000 mg | Freq: Once | INTRAMUSCULAR | Status: AC
  Administered 2015-05-10: 0.2 mg via INTRAVENOUS

## 2015-05-10 MED ORDER — FENTANYL CITRATE (PF) 100 MCG/2ML IJ SOLN
25.0000 ug | INTRAMUSCULAR | Status: AC
  Administered 2015-05-10 (×2): 25 ug via INTRAVENOUS

## 2015-05-10 MED ORDER — LACTATED RINGERS IV SOLN
INTRAVENOUS | Status: DC
  Administered 2015-05-10: 1000 mL via INTRAVENOUS
  Administered 2015-05-10: 08:00:00 via INTRAVENOUS

## 2015-05-10 MED ORDER — STERILE WATER FOR IRRIGATION IR SOLN
Status: DC | PRN
  Administered 2015-05-10: 07:00:00

## 2015-05-10 MED ORDER — ONDANSETRON HCL 4 MG/2ML IJ SOLN
INTRAMUSCULAR | Status: AC
  Filled 2015-05-10: qty 2

## 2015-05-10 MED ORDER — LIDOCAINE VISCOUS 2 % MT SOLN
OROMUCOSAL | Status: AC
  Filled 2015-05-10: qty 15

## 2015-05-10 MED ORDER — PHENYLEPHRINE 40 MCG/ML (10ML) SYRINGE FOR IV PUSH (FOR BLOOD PRESSURE SUPPORT)
PREFILLED_SYRINGE | INTRAVENOUS | Status: AC
  Filled 2015-05-10: qty 10

## 2015-05-10 MED ORDER — FENTANYL CITRATE (PF) 100 MCG/2ML IJ SOLN
INTRAMUSCULAR | Status: AC
  Filled 2015-05-10: qty 2

## 2015-05-10 MED ORDER — PHENYLEPHRINE HCL 10 MG/ML IJ SOLN
INTRAMUSCULAR | Status: DC | PRN
  Administered 2015-05-10 (×3): 80 ug via INTRAVENOUS
  Administered 2015-05-10: 40 ug via INTRAVENOUS

## 2015-05-10 MED ORDER — LIDOCAINE VISCOUS 2 % MT SOLN
15.0000 mL | Freq: Once | OROMUCOSAL | Status: AC
  Administered 2015-05-10: 15 mL via OROMUCOSAL

## 2015-05-10 MED ORDER — PROPOFOL 10 MG/ML IV BOLUS
INTRAVENOUS | Status: AC
Start: 2015-05-10 — End: 2015-05-10
  Filled 2015-05-10: qty 40

## 2015-05-10 MED ORDER — MIDAZOLAM HCL 2 MG/2ML IJ SOLN
1.0000 mg | INTRAMUSCULAR | Status: DC | PRN
  Administered 2015-05-10: 2 mg via INTRAVENOUS

## 2015-05-10 MED ORDER — ONDANSETRON HCL 4 MG/2ML IJ SOLN
4.0000 mg | Freq: Once | INTRAMUSCULAR | Status: AC
  Administered 2015-05-10: 4 mg via INTRAVENOUS

## 2015-05-10 MED ORDER — FENTANYL CITRATE (PF) 100 MCG/2ML IJ SOLN: 25.0000 ug | INTRAMUSCULAR | Status: DC | PRN

## 2015-05-10 MED ORDER — ONDANSETRON HCL 4 MG/2ML IJ SOLN: 4.0000 mg | Freq: Once | INTRAMUSCULAR | Status: DC | PRN

## 2015-05-10 NOTE — Anesthesia Postprocedure Evaluation (Signed)
Anesthesia Post Note  Patient: Travis Sharp  Procedure(s) Performed: Procedure(s) (LRB): COLONOSCOPY WITH PROPOFOL (N/A) ESOPHAGOGASTRODUODENOSCOPY (EGD) WITH PROPOFOL (N/A) SAVORY DILATION (N/A) BIOPSY  Patient location during evaluation: PACU Anesthesia Type: MAC Level of consciousness: awake and alert and oriented Pain management: pain level not controlled Vital Signs Assessment: post-procedure vital signs reviewed and stable Respiratory status: spontaneous breathing and respiratory function stable Cardiovascular status: stable Postop Assessment: no signs of nausea or vomiting Anesthetic complications: no    Last Vitals:  Filed Vitals:   05/10/15 0700 05/10/15 0715  BP: 144/101 120/87  Pulse:    Temp:    Resp: 12 19    Last Pain: There were no vitals filed for this visit.               Hanley Rispoli A

## 2015-05-10 NOTE — Anesthesia Preprocedure Evaluation (Signed)
Anesthesia Evaluation  Patient identified by MRN, date of birth, ID band Patient awake    Reviewed: Allergy & Precautions, NPO status , Patient's Chart, lab work & pertinent test results  Airway Mallampati: I  TM Distance: >3 FB     Dental  (+) Teeth Intact, Partial Lower, Partial Upper, Missing   Pulmonary former smoker,    breath sounds clear to auscultation       Cardiovascular hypertension, Pt. on medications  Rhythm:Regular Rate:Normal     Neuro/Psych    GI/Hepatic GERD  ,(+)     substance abuse  marijuana use,   Endo/Other  diabetes, Type 2  Renal/GU      Musculoskeletal   Abdominal   Peds  Hematology   Anesthesia Other Findings   Reproductive/Obstetrics                             Anesthesia Physical Anesthesia Plan  ASA: III  Anesthesia Plan: MAC   Post-op Pain Management:    Induction: Intravenous  Airway Management Planned: Simple Face Mask  Additional Equipment:   Intra-op Plan:   Post-operative Plan:   Informed Consent: I have reviewed the patients History and Physical, chart, labs and discussed the procedure including the risks, benefits and alternatives for the proposed anesthesia with the patient or authorized representative who has indicated his/her understanding and acceptance.     Plan Discussed with:   Anesthesia Plan Comments:         Anesthesia Quick Evaluation

## 2015-05-10 NOTE — Discharge Instructions (Signed)
YOUR PROBLEM SWALLOWING IS MOST LIKELY DUE TO THE STRICTURE. YOU HAVE MILD GASTRITIS LIKELY DUE TO IBUPROFEN USE. I STRETCHED YOUR ESOPHAGUS DUE TO AN ESOPHAGEAL STRICTURE. I BIOPSIED YOUR STOMACH. THE FINDING ON CT IS MOST LIKELY DUE TO DIVERTICULOSIS.   RETURN TO WORK WITHOUT RESTRICTIONS ON [address].  DRINK WATER TO KEEP YOUR URINE LIGHT YELLOW.  FOLLOW A HIGH FIBER/LOW FAT DIET. AVOID ITEMS THAT CAUSE BLOATING. SEE INFO BELOW.  CONTINUE PROTONIX. TAKE 30 MINUTES PRIOR TO BREAKFAST.  YOUR BIOPSY RESULTS WILL BE AVAILABLE IN MY CHART JAN 27 AND MY OFFICE WILL CONTACT YOU IN 10-14 DAYS WITH YOUR RESULTS.   Next colonoscopy in 10 years.    ENDOSCOPY Care After Read the instructions outlined below and refer to this sheet in the next week. These discharge instructions provide you with general information on caring for yourself after you leave the hospital. While your treatment has been planned according to the most current medical practices available, unavoidable complications occasionally occur. If you have any problems or questions after discharge, call DR. Joseluis Alessio, [phone].  ACTIVITY  You may resume your regular activity, but move at a slower pace for the next 24 hours.   Take frequent rest periods for the next 24 hours.   Walking will help get rid of the air and reduce the bloated feeling in your belly (abdomen).   No driving for 24 hours (because of the medicine (anesthesia) used during the test).   You may shower.   Do not sign any important legal documents or operate any machinery for 24 hours (because of the anesthesia used during the test).    NUTRITION  Drink plenty of fluids.   You may resume your normal diet as instructed by your doctor.   Begin with a light meal and progress to your normal diet. Heavy or fried foods are harder to digest and may make you feel sick to your stomach (nauseated).   Avoid alcoholic beverages for 24 hours or as instructed.     MEDICATIONS  You may resume your normal medications.   WHAT YOU CAN EXPECT TODAY  Some feelings of bloating in the abdomen.   Passage of more gas than usual.   Spotting of blood in your stool or on the toilet paper  .  IF YOU HAD POLYPS REMOVED DURING THE ENDOSCOPY:  Eat a soft diet IF YOU HAVE NAUSEA, BLOATING, ABDOMINAL PAIN, OR VOMITING.    FINDING OUT THE RESULTS OF YOUR TEST Not all test results are available during your visit. DR. Oneida Alar WILL CALL YOU WITHIN 14 DAYS OF YOUR PROCEDUE WITH YOUR RESULTS. Do not assume everything is normal if you have not heard from DR. Carlson Belland, CALL HER OFFICE AT [phone].  SEEK IMMEDIATE MEDICAL ATTENTION AND CALL THE OFFICE: [phone] IF:  You have more than a spotting of blood in your stool.   Your belly is swollen (abdominal distention).   You are nauseated or vomiting.   You have a temperature over 101F.   You have abdominal pain or discomfort that is severe or gets worse throughout the day.   Diverticulosis Diverticulosis is a common condition that develops when small pouches (diverticula) form in the wall of the colon. The risk of diverticulosis increases with age. It happens more often in people who eat a low-fiber diet. Most individuals with diverticulosis have no symptoms. Those individuals with symptoms usually experience belly (abdominal) pain, constipation, or loose stools (diarrhea).  HOME CARE INSTRUCTIONS  Increase the amount  of fiber in your diet as directed by your caregiver or dietician. This may reduce symptoms of diverticulosis.   Drink at least 6 to 8 glasses of water each day to prevent constipation.   Try not to strain when you have a bowel movement.   Avoiding nuts and seeds to prevent complications is NOT NECESSARY.   FOODS HAVING HIGH FIBER CONTENT INCLUDE:  Fruits. Apple, peach, pear, tangerine, raisins, prunes.   Vegetables. Brussels sprouts, asparagus, broccoli, cabbage, carrot,  cauliflower, romaine lettuce, spinach, summer squash, tomato, winter squash, zucchini.   Starchy Vegetables. Baked beans, kidney beans, lima beans, split peas, lentils, potatoes (with skin).   Grains. Whole wheat bread, brown rice, bran flake cereal, plain oatmeal, white rice, shredded wheat, bran muffins.   SEEK IMMEDIATE MEDICAL CARE IF:  You develop increasing pain or severe bloating.   You have an oral temperature above 101F.   You develop vomiting or bowel movements that are bloody or black.     Low-Fat Diet BREADS, CEREALS, PASTA, RICE, DRIED PEAS, AND BEANS These products are high in carbohydrates and most are low in fat. Therefore, they can be increased in the diet as substitutes for fatty foods. They too, however, contain calories and should not be eaten in excess. Cereals can be eaten for snacks as well as for breakfast.   FRUITS AND VEGETABLES It is good to eat fruits and vegetables. Besides being sources of fiber, both are rich in vitamins and some minerals. They help you get the daily allowances of these nutrients. Fruits and vegetables can be used for snacks and desserts.  MEATS Limit lean meat, chicken, [place], and fish to no more than 6 ounces per day. Beef, Pork, and Lamb Use lean cuts of beef, pork, and lamb. Lean cuts include:  Extra-lean ground beef.  Arm roast.  Sirloin tip.  Center-cut ham.  Round steak.  Loin chops.  Rump roast.  Tenderloin.  Trim all fat off the outside of meats before cooking. It is not necessary to severely decrease the intake of red meat, but lean choices should be made. Lean meat is rich in protein and contains a highly absorbable form of iron. Premenopausal women, in particular, should avoid reducing lean red meat because this could increase the risk for low red blood cells (iron-deficiency anemia).  Chicken and [place] These are good sources of protein. The fat of poultry can be reduced by removing the skin and underlying fat layers  before cooking. Chicken and [place] can be substituted for lean red meat in the diet. Poultry should not be fried or covered with high-fat sauces. Fish and Shellfish Fish is a good source of protein. Shellfish contain cholesterol, but they usually are low in saturated fatty acids. The preparation of fish is important. Like chicken and [place], they should not be fried or covered with high-fat sauces. EGGS Egg whites contain no fat or cholesterol. They can be eaten often. Try 1 to 2 egg whites instead of whole eggs in recipes or use egg substitutes that do not contain yolk. MILK AND DAIRY PRODUCTS Use skim or 1% milk instead of 2% or whole milk. Decrease whole milk, natural, and processed cheeses. Use nonfat or low-fat (2%) cottage cheese or low-fat cheeses made from vegetable oils. Choose nonfat or low-fat (1 to 2%) yogurt. Experiment with evaporated skim milk in recipes that call for heavy cream. Substitute low-fat yogurt or low-fat cottage cheese for sour cream in dips and salad dressings. Have at least 2 servings of  low-fat dairy products, such as 2 glasses of skim (or 1%) milk each day to help get your daily calcium intake. FATS AND OILS Reduce the total intake of fats, especially saturated fat. Butterfat, lard, and beef fats are high in saturated fat and cholesterol. These should be avoided as much as possible. Vegetable fats do not contain cholesterol, but certain vegetable fats, such as coconut oil, palm oil, and palm kernel oil are very high in saturated fats. These should be limited. These fats are often used in bakery goods, processed foods, popcorn, oils, and nondairy creamers. Vegetable shortenings and some peanut butters contain hydrogenated oils, which are also saturated fats. Read the labels on these foods and check for saturated vegetable oils. Unsaturated vegetable oils and fats do not raise blood cholesterol. However, they should be limited because they are fats and are high in calories.  Total fat should still be limited to 30% of your daily caloric intake. Desirable liquid vegetable oils are corn oil, cottonseed oil, olive oil, canola oil, safflower oil, soybean oil, and sunflower oil. Peanut oil is not as good, but small amounts are acceptable. Buy a heart-healthy tub margarine that has no partially hydrogenated oils in the ingredients. Mayonnaise and salad dressings often are made from unsaturated fats, but they should also be limited because of their high calorie and fat content. Seeds, nuts, peanut butter, olives, and avocados are high in fat, but the fat is mainly the unsaturated type. These foods should be limited mainly to avoid excess calories and fat. OTHER EATING TIPS Snacks  Most sweets should be limited as snacks. They tend to be rich in calories and fats, and their caloric content outweighs their nutritional value. Some good choices in snacks are graham crackers, melba toast, soda crackers, bagels (no egg), English muffins, fruits, and vegetables. These snacks are preferable to snack crackers, [place] fries, TORTILLA CHIPS, and POTATO chips. Popcorn should be air-popped or cooked in small amounts of liquid vegetable oil. Desserts Eat fruit, low-fat yogurt, and fruit ices instead of pastries, cake, and cookies. Sherbet, angel food cake, gelatin dessert, frozen low-fat yogurt, or other frozen products that do not contain saturated fat (pure fruit juice bars, frozen ice pops) are also acceptable.  COOKING METHODS Choose those methods that use little or no fat. They include: Poaching.  Braising.  Steaming.  Grilling.  Baking.  Stir-frying.  Broiling.  Microwaving.  Foods can be cooked in a nonstick pan without added fat, or use a nonfat cooking spray in regular cookware. Limit fried foods and avoid frying in saturated fat. Add moisture to lean meats by using water, broth, cooking wines, and other nonfat or low-fat sauces along with the cooking methods mentioned  above. Soups and stews should be chilled after cooking. The fat that forms on top after a few hours in the refrigerator should be skimmed off. When preparing meals, avoid using excess salt. Salt can contribute to raising blood pressure in some people.  EATING AWAY FROM HOME Order entres, potatoes, and vegetables without sauces or butter. When meat exceeds the size of a deck of cards (3 to 4 ounces), the rest can be taken home for another meal. Choose vegetable or fruit salads and ask for low-calorie salad dressings to be served on the side. Use dressings sparingly. Limit high-fat toppings, such as bacon, crumbled eggs, cheese, sunflower seeds, and olives. Ask for heart-healthy tub margarine instead of butter.  High-Fiber Diet A high-fiber diet changes your normal diet to include more whole grains,  legumes, fruits, and vegetables. Changes in the diet involve replacing refined carbohydrates with unrefined foods. The calorie level of the diet is essentially unchanged. The Dietary Reference Intake (recommended amount) for adult males is 38 grams per day. For adult females, it is 25 grams per day. Pregnant and lactating women should consume 28 grams of fiber per day. Fiber is the intact part of a plant that is not broken down during digestion. Functional fiber is fiber that has been isolated from the plant to provide a beneficial effect in the body. PURPOSE  Increase stool bulk.   Ease and regulate bowel movements.   Lower cholesterol.  REDUCE RISK OF COLON CANCER  INDICATIONS THAT YOU NEED MORE FIBER  Constipation and hemorrhoids.   Uncomplicated diverticulosis (intestine condition) and irritable bowel syndrome.   Weight management.   As a protective measure against hardening of the arteries (atherosclerosis), diabetes, and cancer.   GUIDELINES FOR INCREASING FIBER IN THE DIET  Start adding fiber to the diet slowly. A gradual increase of about 5 more grams (2 slices of whole-wheat bread,  2 servings of most fruits or vegetables, or 1 bowl of high-fiber cereal) per day is best. Too rapid an increase in fiber may result in constipation, flatulence, and bloating.   Drink enough water and fluids to keep your urine clear or pale yellow. Water, juice, or caffeine-free drinks are recommended. Not drinking enough fluid may cause constipation.   Eat a variety of high-fiber foods rather than one type of fiber.   Try to increase your intake of fiber through using high-fiber foods rather than fiber pills or supplements that contain small amounts of fiber.   The goal is to change the types of food eaten. Do not supplement your present diet with high-fiber foods, but replace foods in your present diet.   INCLUDE A VARIETY OF FIBER SOURCES  Replace refined and processed grains with whole grains, canned fruits with fresh fruits, and incorporate other fiber sources. White rice, white breads, and most bakery goods contain little or no fiber.   Brown whole-grain rice, buckwheat oats, and many fruits and vegetables are all good sources of fiber. These include: broccoli, Brussels sprouts, cabbage, cauliflower, beets, sweet potatoes, white potatoes (skin on), carrots, tomatoes, eggplant, squash, berries, fresh fruits, and dried fruits.   Cereals appear to be the richest source of fiber. Cereal fiber is found in whole grains and bran. Bran is the fiber-rich outer coat of cereal grain, which is largely removed in refining. In whole-grain cereals, the bran remains. In breakfast cereals, the largest amount of fiber is found in those with "bran" in their names. The fiber content is sometimes indicated on the label.   You may need to include additional fruits and vegetables each day.   In baking, for 1 cup white flour, you may use the following substitutions:   1 cup whole-wheat flour minus 2 tablespoons.   1/2 cup white flour plus 1/2 cup whole-wheat flour.    REFLUX  SYMPTOMS Common symptoms of  GERD are heartburn (burning in your chest). This is worse when lying down or bending over. It may also cause belching, or difficulty swallowing, and indigestion. Some of the things which make GERD worse are:  Increased weight pushes on stomach making acid rise more easily.   Smoking markedly increases acid production.   HOME CARE INSTRUCTIONS  Try to achieve and maintain an ideal body weight.   Avoid drinking alcoholic beverages.   DO NOT smokE.  Do not wear tight clothing around your chest or stomach.   Eat smaller meals and eat more frequently. This keeps your stomach from getting too full. Eat slowly.   Do not lie down for 2 or 3 hours after eating. Do not eat or drink anything 1 to 2 hours before going to bed.   Avoid caffeine beverages (colas, coffee, cocoa, tea), fatty foods, citrus fruits and all other foods and drinks that contain acid and that seem to increase the problems.   Avoid bending over, especially after eating OR STRAINING. Anything that increases the pressure in your belly increases the amount of acid that may be pushed up into your esophagus.   Gastritis  Gastritis is an inflammation (the body's way of reacting to injury and/or infection) of the stomach. It is often caused by viral or bacterial (germ) infections. It can also be caused BY ASPIRIN, BC/GOODY POWDER'S, (IBUPROFEN) MOTRIN, OR ALEVE (NAPROXEN), chemicals (including alcohol), SPICY FOODS, and medications. This illness may be associated with generalized malaise (feeling tired, not well), UPPER ABDOMINAL STOMACH cramps, and fever. One common bacterial cause of gastritis is an organism known as H. Pylori. This can be treated with antibiotics.

## 2015-05-10 NOTE — Op Note (Signed)
[facility] [address] [place], [address]   ENDOSCOPY PROCEDURE REPORT  PATIENT: Travis Sharp, Travis Sharp  MR#: [id number] BIRTHDATE: Jun 01, 1955 , 28  yrs. old GENDER: male  ENDOSCOPIST: Danie Binder, MD REFFERED [id number] Collene Mares, PA-C  PROCEDURE DATE:  2015-06-08 PROCEDURE:   EGD with biopsy and EGD with dilatation over guidewire   INDICATIONS:1.  dysphagia.   2.  weight loss. OCCASIONALLY USES IBUPROFEN. MEDICATIONS: Monitored anesthesia care TOPICAL ANESTHETIC: Viscous Xylocaine  DESCRIPTION OF PROCEDURE:   After the risks benefits and alternatives of the procedure were thoroughly explained, informed consent was obtained.  The [id number]  endoscope was introduced through the mouth and advanced to the second portion of the duodenum. The instrument was slowly withdrawn as the mucosa was carefully examined.  Prior to withdrawal of the scope, the guidwire was placed.  The esophagus was dilated successfully.  The patient was recovered in endoscopy and discharged home in satisfactory condition. Estimated blood loss is zero unless otherwise noted in this procedure report.   ESOPHAGUS: A stricture was found at the gastroesophageal junction. The stenosis was traversable with the endoscope.   STOMACH: Mild non-erosive gastritis (inflammation) was found in the gastric body and gastric antrum.  Multiple biopsies were performed using cold forceps.  DUODENUM: The duodenal mucosa showed no abnormalities in the bulb and second portion of the duodenum.   Dilation was then performed at the gastroesphageal junction Dilator: Savary over guidewire Size(s): 12.8-16 mm Resistance: moderate Heme: none  COMPLICATIONS: There were no immediate complications.  ENDOSCOPIC IMPRESSION: 1.   DYSPHAGIA DUE TO Stricture  at the gastroesophageal junction 2.   MILD Non-erosive gastritis  RECOMMENDATIONS: RETURN TO WORK WITHOUT RESTRICTIONS ON THUR JAN 26. DRINK WATER TO KEEP  URINE LIGHT YELLOW. FOLLOW A HIGH FIBER/LOW FAT DIET. PROTONIX 30 MINUTES PRIOR TO BREAKFAST. AWAIT BIOPSY RESULTS . Next colonoscopy in 10 years.   _______________________________ eSignedDanie Binder, MD 06-08-2015 9:15 AM   CPT CODES: ICD CODES:  The ICD and CPT codes recommended by this software are interpretations from the data that the clinical staff has captured with the software.  The verification of the translation of this report to the ICD and CPT codes and modifiers is the sole responsibility of the health care institution and practicing physician where this report was generated.  [employer] will not be held responsible for the validity of the ICD and CPT codes included on this report.  AMA assumes no liability for data contained or not contained herein. CPT is a Designer, television/film set of the [employer].

## 2015-05-10 NOTE — Anesthesia Procedure Notes (Signed)
Procedure Name: MAC Date/Time: 05/10/2015 7:30 AM Performed by: Andree Elk, Clotilde Loth A Pre-anesthesia Checklist: Patient identified, Emergency Drugs available, Suction available, Patient being monitored and Timeout performed Patient Re-evaluated:Patient Re-evaluated prior to inductionOxygen Delivery Method: Simple face mask

## 2015-05-10 NOTE — H&P (Signed)
  Primary Care Physician:  Collene Mares, PA-C Primary Gastroenterologist:  Dr. Oneida Alar  Pre-Procedure History & Physical: HPI:  Travis Sharp is a 60 y.o. male here for COLITIS/DYSPHAGIA.  Past Medical History  Diagnosis Date  . Hypertension   . Diabetes mellitus   . Colitis 03/23/2015    Past Surgical History  Procedure Laterality Date  . Tonsillectomy      Prior to Admission medications   Medication Sig Start Date End Date Taking? Authorizing Provider  BENICAR HCT 40-25 MG tablet Take 1 tablet by mouth daily. RESTART IN 2 DAYS. Patient taking differently: Take 1 tablet by mouth daily.  03/27/15  Yes Rexene Alberts, MD  Insulin Degludec (TRESIBA FLEXTOUCH) 100 UNIT/ML SOPN Inject 22 Units into the skin daily.   Yes Historical Provider, MD  insulin detemir (LEVEMIR) 100 UNIT/ML injection Inject 20 Units into the skin at bedtime.   Yes Historical Provider, MD  Na Sulfate-K Sulfate-Mg Sulf SOLN Take 1 kit by mouth once. 04/26/15 05/26/15 Yes Orvil Feil, NP  ondansetron (ZOFRAN) 4 MG tablet Take 1 tablet (4 mg total) by mouth every 8 (eight) hours as needed for nausea or vomiting. 04/26/15  Yes Orvil Feil, NP  pantoprazole (PROTONIX) 40 MG tablet Take 1 tablet (40 mg total) by mouth daily. 04/26/15  Yes Orvil Feil, NP    Allergies as of 04/26/2015  . (No Known Allergies)    Family History  Problem Relation Age of Onset  . Colon cancer      possibly 2 uncles  . Stroke Mother   . Aneurysm Mother   . Kidney failure Brother   . Heart disease Father     Social History   Social History  . Marital Status: Married    Spouse Name: N/A  . Number of Children: N/A  . Years of Education: N/A   Occupational History  . [employer]    Social History Main Topics  . Smoking status: Former Smoker -- 1.00 packs/day for 28 years    Quit date: 05/04/2005  . Smokeless tobacco: Not on file  . Alcohol Use: No  . Drug Use: Yes    Special: Marijuana     Comment: marijuana - last used 1  month ago  . Sexual Activity: Not on file   Other Topics Concern  . Not on file   Social History Narrative    Review of Systems: See HPI, otherwise negative ROS   Physical Exam: BP 144/101 mmHg  Pulse 88  Temp(Src) 97.9 F (36.6 C) (Oral)  Resp 12  SpO2 98% General:   Alert,  pleasant and cooperative in NAD Head:  Normocephalic and atraumatic. Neck:  Supple; Lungs:  Clear throughout to auscultation.    Heart:  Regular rate and rhythm. Abdomen:  Soft, nontender and nondistended. Normal bowel sounds, without guarding, and without rebound.   Neurologic:  Alert and  oriented x4;  grossly normal neurologically.  Impression/Plan:    COLITIS/. DYSPHAGIA  PLAN:  TCS/EGD/DIL TODAY

## 2015-05-10 NOTE — Op Note (Addendum)
[facility] [address] [place], [address]   COLONOSCOPY PROCEDURE REPORT  PATIENT: Travis Sharp, Travis Sharp  MR#: [id number] BIRTHDATE: July 20, 1955 , 19  yrs. old GENDER: male ENDOSCOPIST: Merrie Roof REFERRED [id number] Collene Mares, PA-C PROCEDURE DATE:  05/10/2015 PROCEDURE:   Colonoscopy, diagnostic INDICATIONS:abnormal SIGMOID COLON WALL ON CT DEC 2016.Marland Kitchen MEDICATIONS: Monitored anesthesia care  DESCRIPTION OF PROCEDURE:    Physical exam was performed.  Informed consent was obtained from the patient after explaining the benefits, risks, and alternatives to procedure.  The patient was connected to monitor and placed in left lateral position. Continuous oxygen was provided by nasal cannula and IV medicine administered through an indwelling cannula.  After administration of sedation and rectal exam, the patients rectum was intubated and the [id number]  colonoscope was advanced under direct visualization to the ileum.  The scope was removed slowly by carefully examining the color, texture, anatomy, and integrity mucosa on the way out.  The patient was recovered in endoscopy and discharged home in satisfactory condition. Estimated blood loss is zero unless otherwise noted in this procedure report.    COLON FINDINGS: The examined terminal ileum appeared to be normal. , There was mild diverticulosis noted in the descending colon and transverse colon.  , There was moderate diverticulosis noted in the sigmoid colon with associated tortuosity and muscular hypertrophy. , The examination was otherwise normal.  , and Moderate sized internal hemorrhoids were found.  PREP QUALITY: excellent.  CECAL W/D TIME: 11       minutes COMPLICATIONS: None  ENDOSCOPIC IMPRESSION: 1.   FINDING ON CT MOST LIKELY DUE TO MUSCULAR HYPERTROPHY/DIVERTICULOSIS 2.   Mild TO MODERTAE diverticulosis 3.   Moderate sized internal hemorrhoids  RECOMMENDATIONS: HIGH FIBER DIET NEXT TCS IN  10 YEARS      _______________________________ eSignedDanie Binder, MD 2015-06-28 8:41 PM Revised: Jun 28, 2015 8:41 PM  CPT CODES: ICD CODES:  The ICD and CPT codes recommended by this software are interpretations from the data that the clinical staff has captured with the software.  The verification of the translation of this report to the ICD and CPT codes and modifiers is the sole responsibility of the health care institution and practicing physician where this report was generated.  [place] will not be held responsible for the validity of the ICD and CPT codes included on this report.  AMA assumes no liability for data contained or not contained herein. CPT is a Designer, television/film set of the [employer].

## 2015-05-10 NOTE — Transfer of Care (Signed)
Immediate Anesthesia Transfer of Care Note  Patient: Travis Sharp  Procedure(s) Performed: Procedure(s) with comments: COLONOSCOPY WITH PROPOFOL (N/A) - 0730 ESOPHAGOGASTRODUODENOSCOPY (EGD) WITH PROPOFOL (N/A) SAVORY DILATION (N/A) BIOPSY - gastric bx's  Patient Location: PACU  Anesthesia Type:MAC  Level of Consciousness: awake, alert , oriented and patient cooperative  Airway & Oxygen Therapy: Patient Spontanous Breathing and Patient connected to nasal cannula oxygen  Post-op Assessment: Report given to RN and Post -op Vital signs reviewed and stable  Post vital signs: Reviewed and stable  Last Vitals:  Filed Vitals:   05/10/15 0700 05/10/15 0715  BP: 144/101 120/87  Pulse:    Temp:    Resp: 12 19    Complications: No apparent anesthesia complications

## 2015-05-10 NOTE — Progress Notes (Signed)
REVIEWED-NO ADDITIONAL RECOMMENDATIONS. 

## 2015-05-12 ENCOUNTER — Encounter (HOSPITAL_COMMUNITY): Payer: Self-pay | Admitting: Gastroenterology

## 2015-05-18 ENCOUNTER — Observation Stay (HOSPITAL_COMMUNITY): Payer: BLUE CROSS/BLUE SHIELD

## 2015-05-18 ENCOUNTER — Inpatient Hospital Stay (HOSPITAL_COMMUNITY)
Admission: EM | Admit: 2015-05-18 | Discharge: 2015-05-21 | DRG: 392 | Disposition: A | Discharge status: Home / Self Care | Payer: BLUE CROSS/BLUE SHIELD | Attending: Internal Medicine | Admitting: Internal Medicine

## 2015-05-18 ENCOUNTER — Encounter (HOSPITAL_COMMUNITY): Payer: Self-pay | Admitting: *Deleted

## 2015-05-18 DIAGNOSIS — R1115 Cyclical vomiting syndrome unrelated to migraine: Secondary | ICD-10-CM

## 2015-05-18 DIAGNOSIS — Z841 Family history of disorders of kidney and ureter: Secondary | ICD-10-CM

## 2015-05-18 DIAGNOSIS — E119 Type 2 diabetes mellitus without complications: Secondary | ICD-10-CM | POA: Diagnosis not present

## 2015-05-18 DIAGNOSIS — K529 Noninfective gastroenteritis and colitis, unspecified: Secondary | ICD-10-CM | POA: Diagnosis not present

## 2015-05-18 DIAGNOSIS — Z87891 Personal history of nicotine dependence: Secondary | ICD-10-CM

## 2015-05-18 DIAGNOSIS — G43A1 Cyclical vomiting, intractable: Secondary | ICD-10-CM | POA: Diagnosis not present

## 2015-05-18 DIAGNOSIS — E1159 Type 2 diabetes mellitus with other circulatory complications: Secondary | ICD-10-CM

## 2015-05-18 DIAGNOSIS — E860 Dehydration: Secondary | ICD-10-CM

## 2015-05-18 DIAGNOSIS — R1110 Vomiting, unspecified: Secondary | ICD-10-CM | POA: Diagnosis not present

## 2015-05-18 DIAGNOSIS — I10 Essential (primary) hypertension: Secondary | ICD-10-CM | POA: Diagnosis not present

## 2015-05-18 DIAGNOSIS — Z794 Long term (current) use of insulin: Secondary | ICD-10-CM | POA: Diagnosis not present

## 2015-05-18 DIAGNOSIS — R112 Nausea with vomiting, unspecified: Secondary | ICD-10-CM | POA: Diagnosis present

## 2015-05-18 DIAGNOSIS — Z8249 Family history of ischemic heart disease and other diseases of the circulatory system: Secondary | ICD-10-CM

## 2015-05-18 DIAGNOSIS — Z823 Family history of stroke: Secondary | ICD-10-CM

## 2015-05-18 DIAGNOSIS — F1210 Cannabis abuse, uncomplicated: Secondary | ICD-10-CM | POA: Diagnosis present

## 2015-05-18 DIAGNOSIS — Z800 Family history of malignant neoplasm of digestive organs: Secondary | ICD-10-CM

## 2015-05-18 LAB — GLUCOSE, CAPILLARY
Glucose-Capillary: 106 mg/dL — ABNORMAL HIGH (ref 65–99)
Glucose-Capillary: 136 mg/dL — ABNORMAL HIGH (ref 65–99)

## 2015-05-18 LAB — URINALYSIS, ROUTINE W REFLEX MICROSCOPIC
Glucose, UA: NEGATIVE mg/dL
Hgb urine dipstick: NEGATIVE
Ketones, ur: 15 mg/dL — AB
Leukocytes, UA: NEGATIVE
Nitrite: NEGATIVE
Protein, ur: NEGATIVE mg/dL
Specific Gravity, Urine: >1.03 — ABNORMAL HIGH (ref 1.005–1.030)
pH: 6 (ref 5.0–8.0)

## 2015-05-18 LAB — CBC WITH DIFFERENTIAL/PLATELET
Basophils Absolute: 0 10*3/uL (ref 0.0–0.1)
Basophils Relative: 0 %
Eosinophils Absolute: 0.1 10*3/uL (ref 0.0–0.7)
Eosinophils Relative: 1 %
HCT: 45.3 % (ref 39.0–52.0)
Hemoglobin: 16.4 g/dL (ref 13.0–17.0)
Lymphocytes Relative: 28 %
Lymphs Abs: 2.2 10*3/uL (ref 0.7–4.0)
MCH: 31.5 pg (ref 26.0–34.0)
MCHC: 36.2 g/dL — ABNORMAL HIGH (ref 30.0–36.0)
MCV: 86.9 fL (ref 78.0–100.0)
Monocytes Absolute: 0.5 10*3/uL (ref 0.1–1.0)
Monocytes Relative: 7 %
Neutro Abs: 4.9 10*3/uL (ref 1.7–7.7)
Neutrophils Relative %: 64 %
Platelets: 218 10*3/uL (ref 150–400)
RBC: 5.21 MIL/uL (ref 4.22–5.81)
RDW: 13.1 % (ref 11.5–15.5)
WBC: 7.7 10*3/uL (ref 4.0–10.5)

## 2015-05-18 LAB — COMPREHENSIVE METABOLIC PANEL
ALT: 24 U/L (ref 17–63)
AST: 20 U/L (ref 15–41)
Albumin: 4.7 g/dL (ref 3.5–5.0)
Alkaline Phosphatase: 46 U/L (ref 38–126)
Anion gap: 9 (ref 5–15)
BUN: 13 mg/dL (ref 6–20)
CO2: 28 mmol/L (ref 22–32)
Calcium: 9.9 mg/dL (ref 8.9–10.3)
Chloride: 101 mmol/L (ref 101–111)
Creatinine, Ser: 0.94 mg/dL (ref 0.61–1.24)
GFR calc Af Amer: >60 mL/min (ref >60)
GFR calc non Af Amer: >60 mL/min (ref >60)
Glucose, Bld: 140 mg/dL — ABNORMAL HIGH (ref 65–99)
Potassium: 3.9 mmol/L (ref 3.5–5.1)
Sodium: 138 mmol/L (ref 135–145)
Total Bilirubin: 1.6 mg/dL — ABNORMAL HIGH (ref 0.3–1.2)
Total Protein: 7.5 g/dL (ref 6.5–8.1)

## 2015-05-18 LAB — CBG MONITORING, ED: Glucose-Capillary: 134 mg/dL — ABNORMAL HIGH (ref 65–99)

## 2015-05-18 LAB — RAPID URINE DRUG SCREEN, HOSP PERFORMED
Amphetamines: NOT DETECTED
Barbiturates: NOT DETECTED
Benzodiazepines: NOT DETECTED
Cocaine: NOT DETECTED
Opiates: NOT DETECTED
Tetrahydrocannabinol: POSITIVE — AB

## 2015-05-18 LAB — LIPASE, BLOOD: Lipase: 58 U/L — ABNORMAL HIGH (ref 11–51)

## 2015-05-18 MED ORDER — PROCHLORPERAZINE EDISYLATE 5 MG/ML IJ SOLN
10.0000 mg | Freq: Four times a day (QID) | INTRAMUSCULAR | Status: DC | PRN
  Administered 2015-05-19 – 2015-05-21 (×5): 10 mg via INTRAVENOUS
  Filled 2015-05-18 (×7): qty 2

## 2015-05-18 MED ORDER — DIPHENHYDRAMINE HCL 50 MG/ML IJ SOLN
25.0000 mg | Freq: Once | INTRAMUSCULAR | Status: AC
  Administered 2015-05-18: 25 mg via INTRAVENOUS
  Filled 2015-05-18: qty 1

## 2015-05-18 MED ORDER — PANTOPRAZOLE SODIUM 40 MG IV SOLR
40.0000 mg | INTRAVENOUS | Status: DC
  Administered 2015-05-18 – 2015-05-21 (×4): 40 mg via INTRAVENOUS
  Filled 2015-05-18 (×4): qty 40

## 2015-05-18 MED ORDER — SODIUM CHLORIDE 0.9 % IV BOLUS (SEPSIS)
1000.0000 mL | Freq: Once | INTRAVENOUS | Status: AC
  Administered 2015-05-18: 1000 mL via INTRAVENOUS

## 2015-05-18 MED ORDER — HEPARIN SODIUM (PORCINE) 5000 UNIT/ML IJ SOLN
5000.0000 [IU] | Freq: Three times a day (TID) | INTRAMUSCULAR | Status: DC
  Administered 2015-05-18 – 2015-05-21 (×9): 5000 [IU] via SUBCUTANEOUS
  Filled 2015-05-18 (×10): qty 1

## 2015-05-18 MED ORDER — HALOPERIDOL LACTATE 5 MG/ML IJ SOLN
2.0000 mg | Freq: Once | INTRAMUSCULAR | Status: AC
  Administered 2015-05-18: 2 mg via INTRAVENOUS
  Filled 2015-05-18: qty 1

## 2015-05-18 MED ORDER — PANTOPRAZOLE SODIUM 40 MG IV SOLR: 40.0000 mg | INTRAVENOUS | Status: DC

## 2015-05-18 MED ORDER — METOCLOPRAMIDE HCL 5 MG/ML IJ SOLN
10.0000 mg | Freq: Once | INTRAMUSCULAR | Status: AC
  Administered 2015-05-18: 10 mg via INTRAVENOUS
  Filled 2015-05-18: qty 2

## 2015-05-18 MED ORDER — ONDANSETRON HCL 4 MG/2ML IJ SOLN
4.0000 mg | Freq: Once | INTRAMUSCULAR | Status: AC
  Administered 2015-05-18: 4 mg via INTRAVENOUS
  Filled 2015-05-18: qty 2

## 2015-05-18 MED ORDER — PROMETHAZINE HCL 25 MG/ML IJ SOLN
25.0000 mg | Freq: Four times a day (QID) | INTRAMUSCULAR | Status: DC | PRN
  Administered 2015-05-18: 25 mg via INTRAMUSCULAR
  Filled 2015-05-18: qty 1

## 2015-05-18 MED ORDER — IOHEXOL 300 MG/ML  SOLN
100.0000 mL | Freq: Once | INTRAMUSCULAR | Status: AC | PRN
  Administered 2015-05-18: 100 mL via INTRAVENOUS

## 2015-05-18 MED ORDER — INSULIN DETEMIR 100 UNIT/ML ~~LOC~~ SOLN
10.0000 [IU] | Freq: Every day | SUBCUTANEOUS | Status: DC
  Administered 2015-05-18 – 2015-05-20 (×3): 10 [IU] via SUBCUTANEOUS
  Filled 2015-05-18 (×5): qty 0.1

## 2015-05-18 MED ORDER — HEPARIN SODIUM (PORCINE) 5000 UNIT/ML IJ SOLN: 5000.0000 [IU] | Freq: Three times a day (TID) | INTRAMUSCULAR | Status: DC

## 2015-05-18 MED ORDER — DIATRIZOATE MEGLUMINE & SODIUM 66-10 % PO SOLN
ORAL | Status: AC
  Filled 2015-05-18: qty 30

## 2015-05-18 MED ORDER — CIPROFLOXACIN IN D5W 400 MG/200ML IV SOLN
400.0000 mg | Freq: Two times a day (BID) | INTRAVENOUS | Status: DC
  Administered 2015-05-18 – 2015-05-21 (×6): 400 mg via INTRAVENOUS
  Filled 2015-05-18 (×6): qty 200

## 2015-05-18 MED ORDER — ACETAMINOPHEN 650 MG RE SUPP: 650.0000 mg | Freq: Four times a day (QID) | RECTAL | Status: DC | PRN

## 2015-05-18 MED ORDER — MORPHINE SULFATE (PF) 2 MG/ML IV SOLN: 2.0000 mg | INTRAVENOUS | Status: DC | PRN

## 2015-05-18 MED ORDER — ACETAMINOPHEN 325 MG PO TABS: 650.0000 mg | ORAL_TABLET | Freq: Four times a day (QID) | ORAL | Status: DC | PRN

## 2015-05-18 MED ORDER — PANTOPRAZOLE SODIUM 40 MG IV SOLR
40.0000 mg | Freq: Once | INTRAVENOUS | Status: AC
  Administered 2015-05-18: 40 mg via INTRAVENOUS
  Filled 2015-05-18: qty 40

## 2015-05-18 MED ORDER — INSULIN ASPART 100 UNIT/ML ~~LOC~~ SOLN
0.0000 [IU] | SUBCUTANEOUS | Status: DC
  Administered 2015-05-19 (×4): 2 [IU] via SUBCUTANEOUS
  Administered 2015-05-20 (×2): 3 [IU] via SUBCUTANEOUS
  Administered 2015-05-20 – 2015-05-21 (×5): 2 [IU] via SUBCUTANEOUS

## 2015-05-18 MED ORDER — METRONIDAZOLE IN NACL 5-0.79 MG/ML-% IV SOLN
500.0000 mg | Freq: Three times a day (TID) | INTRAVENOUS | Status: DC
  Administered 2015-05-18 – 2015-05-21 (×9): 500 mg via INTRAVENOUS
  Filled 2015-05-18 (×9): qty 100

## 2015-05-18 MED ORDER — DEXTROSE-NACL 5-0.9 % IV SOLN
INTRAVENOUS | Status: DC
  Administered 2015-05-18 – 2015-05-20 (×5): via INTRAVENOUS

## 2015-05-18 NOTE — ED Notes (Signed)
MD at bedside. 

## 2015-05-18 NOTE — ED Notes (Signed)
Pt c/o abdominal pain with n/v x 2 days 

## 2015-05-18 NOTE — H&P (Signed)
[facility] History and Physical  Travis Sharp [id number] DOB: 01-Dec-1955 DOA: 05/18/2015  Referring physician: Emergency department PCP: Collene Mares, PA-C  Specialists:   Chief Complaint: Intractable N/V  HPI: Travis Sharp is a 60 y.o. male with a hx of DM2, HTN, prior colitis, recently diagnosed gastritis by endoscopy, who presented to ED with LUQ pain with radiation to the back with intractable nausea/vomiting. In the ED multiple aniemetics were given, without success. No imaging done. Given intractable n/v, hospitalist consulted for admission.  Review of Systems:  Review of Systems  Constitutional: Negative for fever and chills.  HENT: Negative for ear discharge and ear pain.   Eyes: Negative for pain and discharge.  Respiratory: Negative for shortness of breath and wheezing.   Cardiovascular: Negative for palpitations and claudication.  Gastrointestinal: Positive for heartburn, nausea, vomiting and abdominal pain.  Genitourinary: Negative for hematuria and flank pain.  Musculoskeletal: Negative for myalgias, falls and neck pain.  Neurological: Negative for sensory change, seizures and loss of consciousness.  Psychiatric/Behavioral: Negative for hallucinations and memory loss.      Past Medical History  Diagnosis Date  . Hypertension   . Diabetes mellitus   . Colitis 03/23/2015   Past Surgical History  Procedure Laterality Date  . Tonsillectomy    . Colonoscopy with propofol N/A 05/10/2015    Procedure: COLONOSCOPY WITH PROPOFOL;  Surgeon: Danie Binder, MD;  Location: [facility];  Service: Endoscopy;  Laterality: N/A;  0730  . Esophagogastroduodenoscopy (egd) with propofol N/A 05/10/2015    Procedure: ESOPHAGOGASTRODUODENOSCOPY (EGD) WITH PROPOFOL;  Surgeon: Danie Binder, MD;  Location: [facility];  Service: Endoscopy;  Laterality: N/A;  . Savory dilation N/A 05/10/2015    Procedure: SAVORY DILATION;  Surgeon: Danie Binder, MD;  Location: [facility];  Service: Endoscopy;  Laterality: N/A;  . Biopsy  05/10/2015    Procedure: BIOPSY;  Surgeon: Danie Binder, MD;  Location: [facility];  Service: Endoscopy;;  gastric bx's   Social History:  reports that he quit smoking about 10 years ago. He does not have any smokeless tobacco history on file. He reports that he uses illicit drugs (Marijuana). He reports that he does not drink alcohol.  where does patient live--home, ALF, SNF? and with whom if at home?  Can patient participate in ADLs?  No Known Allergies  Family History  Problem Relation Age of Onset  . Colon cancer      possibly 2 uncles  . Stroke Mother   . Aneurysm Mother   . Kidney failure Brother   . Heart disease Father      Prior to Admission medications   Medication Sig Start Date End Date Taking? Authorizing Provider  BENICAR HCT 40-25 MG tablet Take 1 tablet by mouth daily. RESTART IN 2 DAYS. Patient taking differently: Take 1 tablet by mouth daily.  03/27/15   Rexene Alberts, MD  Insulin Degludec (TRESIBA FLEXTOUCH) 100 UNIT/ML SOPN Inject 22 Units into the skin daily.    Historical Provider, MD  insulin detemir (LEVEMIR) 100 UNIT/ML injection Inject 20 Units into the skin at bedtime.    Historical Provider, MD  Na Sulfate-K Sulfate-Mg Sulf SOLN Take 1 kit by mouth once. 04/26/15 05/26/15  Orvil Feil, NP  ondansetron (ZOFRAN) 4 MG tablet Take 1 tablet (4 mg total) by mouth every 8 (eight) hours as needed for nausea or vomiting. 04/26/15   Orvil Feil, NP  pantoprazole (PROTONIX) 40 MG tablet Take  1 tablet (40 mg total) by mouth daily. 04/26/15   Orvil Feil, NP   Physical Exam: Filed Vitals:   05/18/15 0630 05/18/15 0700 05/18/15 0730 05/18/15 0800  BP: 142/94 134/58 133/89 133/91  Pulse: 87 91 93 89  Temp:      TempSrc:      Resp:  18    Height:      Weight:      SpO2: 100% 100% 99% 99%     General:  Awake, in nad  Eyes: PERRL B  ENT: membranes moist, dentition fair  Neck: trachea midline, neck  supple  Cardiovascular: regular, s1, s2  Respiratory: normal resp effort, no wheezing  Abdomen: soft, nondistended  Skin: normal skin turgor, no abnormal skin lesions seen  Musculoskeletal: perfused, no clubbing  Psychiatric: mood/affect normal //no auditory/visual hallucinations  Neurologic: cn2-12 grossly intact, strength/sensation intact  Labs on Admission:  Basic Metabolic Panel:  Recent Labs Lab 05/18/15 0230  NA 138  K 3.9  CL 101  CO2 28  GLUCOSE 140*  BUN 13  CREATININE 0.94  CALCIUM 9.9   Liver Function Tests:  Recent Labs Lab 05/18/15 0230  AST 20  ALT 24  ALKPHOS 46  BILITOT 1.6*  PROT 7.5  ALBUMIN 4.7    Recent Labs Lab 05/18/15 0230  LIPASE 58*   No results for input(s): AMMONIA in the last 168 hours. CBC:  Recent Labs Lab 05/18/15 0230  WBC 7.7  NEUTROABS 4.9  HGB 16.4  HCT 45.3  MCV 86.9  PLT 218   Cardiac Enzymes: No results for input(s): CKTOTAL, CKMB, CKMBINDEX, TROPONINI in the last 168 hours.  BNP (last 3 results) No results for input(s): BNP in the last 8760 hours.  ProBNP (last 3 results) No results for input(s): PROBNP in the last 8760 hours.  CBG:  Recent Labs Lab 05/18/15 0316  GLUCAP 134*    Radiological Exams on Admission: No results found.   Assessment/Plan Principal Problem:   Intractable nausea and vomiting Active Problems:   Essential hypertension   Controlled type 2 diabetes mellitus without complication ([place])   1. Intractable n/v 1. Unable to tolerate any PO 2. Cont antiemetics as tolerated/needed 3. Keep NPO for now with sips allowed 4. No imaging done in ED. Will order CT abd given prior hx of colitis 5. Will obtain urine drug screen. Question cannabis hyperemesis syndrome 6. No known sick contacts 7. Admit to med-surg 2. HTN 1. bp stable 2. Cont to monitor 3. DM2 1. Keep on 1/2 dose of home levemir while NPO 2. Cont on ssi coverage 4. DVT prophylaxis 1. Heparin subQ  Code  Status: Full Family Communication: Pt in room, family at bedside Disposition Plan: admit medsurg   Donne Hazel [facility] Pager [phone]  If 7PM-7AM, please contact night-coverage [email or web address] Password [facility] 05/18/2015, 8:33 AM

## 2015-05-18 NOTE — ED Provider Notes (Signed)
CSN: [id number]     Arrival date & time 05/18/15  0153 History   First MD Initiated Contact with Patient 05/18/15 0235   Chief Complaint  Patient presents with  . Abdominal Pain     (Consider location/radiation/quality/duration/timing/severity/associated sxs/prior Treatment) HPI patient reports he started feeling bad on January 30 with nausea and vomiting. He denies abdominal pain but then he tells me he's has pain that radiates back and forth from his left upper quadrant to his right upper quadrant and reverse. He has had mild diarrhea and was constipated. He states he took some linzess. His wife states he had endoscopy and colonoscopy done last week and he had his esophagus stretched. He has not had fever. His wife states he has been eating bacon and sausage for breakfast and he was told to eat a bland diet. He was at work tonight and got worse and preceded to the ED. His wife states he has gastritis.  PCP [facility] Oneida Alar  Past Medical History  Diagnosis Date  . Hypertension   . Diabetes mellitus   . Colitis 03/23/2015   Past Surgical History  Procedure Laterality Date  . Tonsillectomy    . Colonoscopy with propofol N/A 05/10/2015    Procedure: COLONOSCOPY WITH PROPOFOL;  Surgeon: Danie Binder, MD;  [facility];  Service: Endoscopy;  Laterality: N/A;  0730  . Esophagogastroduodenoscopy (egd) with propofol N/A 05/10/2015    Procedure: ESOPHAGOGASTRODUODENOSCOPY (EGD) WITH PROPOFOL;  Surgeon: Danie Binder, MD;  Location: [facility];  Service: Endoscopy;  Laterality: N/A;  . Savory dilation N/A 05/10/2015    Procedure: SAVORY DILATION;  Surgeon: Danie Binder, MD;  [facility];  Service: Endoscopy;  Laterality: N/A;  . Biopsy  05/10/2015    Procedure: BIOPSY;  Surgeon: Danie Binder, MD;  Location: [facility];  Service: Endoscopy;;  gastric bx's   Family History  Problem Relation Age of Onset  . Colon cancer      possibly 2 uncles  . Stroke  Mother   . Aneurysm Mother   . Kidney failure Brother   . Heart disease Father    Social History  Substance Use Topics  . Smoking status: Former Smoker -- 1.00 packs/day for 28 years    Quit date: 05/04/2005  . Smokeless tobacco: None  . Alcohol Use: No  employed Lives with spouse  Review of Systems  All other systems reviewed and are negative.     Allergies  Review of patient's allergies indicates no known allergies.  Home Medications   Prior to Admission medications   Medication Sig Start Date End Date Taking? Authorizing Provider  BENICAR HCT 40-25 MG tablet Take 1 tablet by mouth daily. RESTART IN 2 DAYS. Patient taking differently: Take 1 tablet by mouth daily.  03/27/15   Rexene Alberts, MD  Insulin Degludec (TRESIBA FLEXTOUCH) 100 UNIT/ML SOPN Inject 22 Units into the skin daily.    Historical Provider, MD  insulin detemir (LEVEMIR) 100 UNIT/ML injection Inject 20 Units into the skin at bedtime.    Historical Provider, MD  Na Sulfate-K Sulfate-Mg Sulf SOLN Take 1 kit by mouth once. 04/26/15 05/26/15  Orvil Feil, NP  ondansetron (ZOFRAN) 4 MG tablet Take 1 tablet (4 mg total) by mouth every 8 (eight) hours as needed for nausea or vomiting. 04/26/15   Orvil Feil, NP  pantoprazole (PROTONIX) 40 MG tablet Take 1 tablet (40 mg total) by mouth daily. 04/26/15  Orvil Feil, NP   BP 136/102 mmHg  Pulse 93  Temp(Src) 97.9 F (36.6 C) (Oral)  Resp 20  Ht '5\' 10"'  (1.778 m)  Wt 167 lb (75.751 kg)  BMI 23.96 kg/m2  SpO2 100%  Vital signs normal   Physical Exam  Constitutional: He is oriented to person, place, and time. He appears well-developed and well-nourished.  Non-toxic appearance. He does not appear ill. No distress.  Patient sitting on the end of the stretcher bent over dry heaving.  HENT:  Head: Normocephalic and atraumatic.  Right Ear: External ear normal.  Left Ear: External ear normal.  Nose: Nose normal. No mucosal edema or rhinorrhea.  Mouth/Throat:  Oropharynx is clear and moist and mucous membranes are normal. No dental abscesses or uvula swelling.  Eyes: Conjunctivae and EOM are normal. Pupils are equal, round, and reactive to light.  Neck: Normal range of motion and full passive range of motion without pain. Neck supple.  Cardiovascular: Normal rate, regular rhythm and normal heart sounds.  Exam reveals no gallop and no friction rub.   No murmur heard. Pulmonary/Chest: Effort normal and breath sounds normal. No respiratory distress. He has no wheezes. He has no rhonchi. He has no rales. He exhibits no tenderness and no crepitus.  Abdominal: Soft. Normal appearance and bowel sounds are normal. He exhibits no distension. There is no tenderness. There is no rebound and no guarding.  Patient has nonspecific tenderness and then he denies having tenderness and states it just made his nausea worse.  Musculoskeletal: Normal range of motion. He exhibits no edema or tenderness.  Moves all extremities well.   Neurological: He is alert and oriented to person, place, and time. He has normal strength. No cranial nerve deficit.  Skin: Skin is warm, dry and intact. No rash noted. No erythema. No pallor.  Psychiatric: He has a normal mood and affect. His speech is normal and behavior is normal. His mood appears not anxious.  Nursing note and vitals reviewed.   ED Course  Procedures (including critical care time)  Medications  promethazine (PHENERGAN) injection 25 mg (25 mg Intramuscular Given 05/18/15 0647)  sodium chloride 0.9 % bolus 1,000 mL (0 mLs Intravenous Stopped 05/18/15 0523)  sodium chloride 0.9 % bolus 1,000 mL (0 mLs Intravenous Stopped 05/18/15 0511)  ondansetron (ZOFRAN) injection 4 mg (4 mg Intravenous Given 05/18/15 0256)  pantoprazole (PROTONIX) injection 40 mg (40 mg Intravenous Given 05/18/15 0306)  ondansetron (ZOFRAN) injection 4 mg (4 mg Intravenous Given 05/18/15 0406)  sodium chloride 0.9 % bolus 1,000 mL (0 mLs Intravenous Stopped  05/18/15 0618)  metoCLOPramide (REGLAN) injection 10 mg (10 mg Intravenous Given 05/18/15 0455)  diphenhydrAMINE (BENADRYL) injection 25 mg (25 mg Intravenous Given 05/18/15 0455)  haloperidol lactate (HALDOL) injection 2 mg (2 mg Intravenous Given 05/18/15 0731)    Patient was given IV fluids, he was given IV Zofran and IV protonic's.  Recheck at 4 AM patient still complains of nausea, his Zofran was repeated.  4:45 AM patient still having nausea, the Zofran does not appear to be helping much. He was then given Reglan and Benadryl IV.  6:30 AM nurse reported the Reglan and Benadryl seemed to be working well however when I entered the room patient is seen hugging a trashcan dry heaving. He was then given Phenergan IM. He states he just has nausea he denies abdominal pain.  07:15 Pt still having dry heaves, denies abdominal pain. Will talk to the hospitalist about admission for intractable  nausea and vomiting.   07:39 Dr Wyline Copas admit to obs, med-surg   Labs Review Results for orders placed or performed during the hospital encounter of 05/18/15  Comprehensive metabolic panel  Result Value Ref Range   Sodium 138 135 - 145 mmol/L   Potassium 3.9 3.5 - 5.1 mmol/L   Chloride 101 101 - 111 mmol/L   CO2 28 22 - 32 mmol/L   Glucose, Bld 140 (H) 65 - 99 mg/dL   BUN 13 6 - 20 mg/dL   Creatinine, Ser 0.94 0.61 - 1.24 mg/dL   Calcium 9.9 8.9 - 10.3 mg/dL   Total Protein 7.5 6.5 - 8.1 g/dL   Albumin 4.7 3.5 - 5.0 g/dL   AST 20 15 - 41 U/L   ALT 24 17 - 63 U/L   Alkaline Phosphatase 46 38 - 126 U/L   Total Bilirubin 1.6 (H) 0.3 - 1.2 mg/dL   GFR calc non Af Amer >60 >60 mL/min   GFR calc Af Amer >60 >60 mL/min   Anion gap 9 5 - 15  CBC with Differential  Result Value Ref Range   WBC 7.7 4.0 - 10.5 K/uL   RBC 5.21 4.22 - 5.81 MIL/uL   Hemoglobin 16.4 13.0 - 17.0 g/dL   HCT 45.3 39.0 - 52.0 %   MCV 86.9 78.0 - 100.0 fL   MCH 31.5 26.0 - 34.0 pg   MCHC 36.2 (H) 30.0 - 36.0 g/dL   RDW 13.1 11.5 -  15.5 %   Platelets 218 150 - 400 K/uL   Neutrophils Relative % 64 %   Neutro Abs 4.9 1.7 - 7.7 K/uL   Lymphocytes Relative 28 %   Lymphs Abs 2.2 0.7 - 4.0 K/uL   Monocytes Relative 7 %   Monocytes Absolute 0.5 0.1 - 1.0 K/uL   Eosinophils Relative 1 %   Eosinophils Absolute 0.1 0.0 - 0.7 K/uL   Basophils Relative 0 %   Basophils Absolute 0.0 0.0 - 0.1 K/uL  Lipase, blood  Result Value Ref Range   Lipase 58 (H) 11 - 51 U/L  Urinalysis, Routine w reflex microscopic  Result Value Ref Range   Color, Urine AMBER (A) YELLOW   APPearance CLEAR CLEAR   Specific Gravity, Urine >1.030 (H) 1.005 - 1.030   pH 6.0 5.0 - 8.0   Glucose, UA NEGATIVE NEGATIVE mg/dL   Hgb urine dipstick NEGATIVE NEGATIVE   Bilirubin Urine SMALL (A) NEGATIVE   Ketones, ur 15 (A) NEGATIVE mg/dL   Protein, ur NEGATIVE NEGATIVE mg/dL   Nitrite NEGATIVE NEGATIVE   Leukocytes, UA NEGATIVE NEGATIVE  CBG monitoring, ED  Result Value Ref Range   Glucose-Capillary 134 (H) 65 - 99 mg/dL   Laboratory interpretation all normal except concentrated urine consistent with dehydration, minimally elevated lipase   MDM   Final diagnoses:  Nausea and vomiting, vomiting of unspecified type  Dehydration  Intractable cyclical vomiting with nausea   Plan admission  Rolland Porter, MD, Barbette Or, MD 05/18/15 [phone]

## 2015-05-18 NOTE — Progress Notes (Signed)
ANTIBIOTIC CONSULT NOTE - INITIAL  Pharmacy Consult for Cipro Indication: intra-abdominal infection  No Known Allergies  Patient Measurements: Height: 5\' 9"  (175.3 cm) Weight: 163 lb 1.6 oz (73.982 kg) IBW/kg (Calculated) : 70.7  Vital Signs: Temp: 98.6 F (37 C) (02/01 1437) Temp Source: Oral (02/01 1437) BP: 137/92 mmHg (02/01 1437) Pulse Rate: 85 (02/01 1437) Intake/Output from previous day:   Intake/Output from this shift: Total I/O In: -  Out: 200 [Urine:200]  Labs:  Recent Labs  05/18/15 0230  WBC 7.7  HGB 16.4  PLT 218  CREATININE 0.94   Estimated Creatinine Clearance: 85.7 mL/min (by C-G formula based on Cr of 0.94). No results for input(s): VANCOTROUGH, VANCOPEAK, VANCORANDOM, GENTTROUGH, GENTPEAK, GENTRANDOM, TOBRATROUGH, TOBRAPEAK, TOBRARND, AMIKACINPEAK, AMIKACINTROU, AMIKACIN in the last 72 hours.   Microbiology: No results found for this or any previous visit (from the past 720 hour(s)).  Medical History: Past Medical History  Diagnosis Date  . Hypertension   . Diabetes mellitus   . Colitis 03/23/2015    Medications:  See med rec Assessment: 60 yo presents to ED with intractable nausea/vomiting and abdominal pain. Recent endoscopy showed gastritis. CT findings are concerning for infectious or inflammatory colitis. Empiric tx with cipro and flagyl  Goal of Therapy:  eradication of infection  Plan:  Cipro 400mg  IV q12h Follow up culture results Monitor V/S and labs  Isac Sarna, BS Vena [place], BCPS Clinical Pharmacist Pager [phone] 05/18/2015,3:08 PM

## 2015-05-18 NOTE — Care Management Note (Signed)
Case Management Note  Patient Details  Name: MOHMMAD THISSELL MRN: [id number] Date of Birth: [birth year]  Subjective/Objective:                  Pt admitted for N/V, ? Collitis. Pt is from home, lives with wife and is ind with ADL's. Pt plans to return home with self care at DC. Pt has PCP and no med needs. No HH or DME prior to admission.   Action/Plan: No CM needs.   Expected Discharge Date:  05/19/15               Expected Discharge Plan:  Home/Self Care  In-House Referral:  NA  Discharge planning Services  CM Consult  Post Acute Care Choice:  NA Choice offered to:  NA  DME Arranged:    DME Agency:     HH Arranged:    HH Agency:     Status of Service:  Completed, signed off  Medicare Important Message Given:    Date Medicare IM Given:    Medicare IM give by:    Date Additional Medicare IM Given:    Additional Medicare Important Message give by:     If discussed at [place] of Stay Meetings, dates discussed:    Additional Comments:  Sherald Barge, RN 05/18/2015, 11:40 AM

## 2015-05-19 DIAGNOSIS — R1110 Vomiting, unspecified: Secondary | ICD-10-CM | POA: Diagnosis not present

## 2015-05-19 DIAGNOSIS — E119 Type 2 diabetes mellitus without complications: Secondary | ICD-10-CM | POA: Diagnosis not present

## 2015-05-19 DIAGNOSIS — I10 Essential (primary) hypertension: Secondary | ICD-10-CM | POA: Diagnosis not present

## 2015-05-19 LAB — CBC
HCT: 40 % (ref 39.0–52.0)
Hemoglobin: 14.3 g/dL (ref 13.0–17.0)
MCH: 30.8 pg (ref 26.0–34.0)
MCHC: 35.8 g/dL (ref 30.0–36.0)
MCV: 86.2 fL (ref 78.0–100.0)
Platelets: 196 10*3/uL (ref 150–400)
RBC: 4.64 MIL/uL (ref 4.22–5.81)
RDW: 13 % (ref 11.5–15.5)
WBC: 5.2 10*3/uL (ref 4.0–10.5)

## 2015-05-19 LAB — GLUCOSE, CAPILLARY
Glucose-Capillary: 159 mg/dL — ABNORMAL HIGH (ref 65–99)
Glucose-Capillary: 121 mg/dL — ABNORMAL HIGH (ref 65–99)
Glucose-Capillary: 128 mg/dL — ABNORMAL HIGH (ref 65–99)
Glucose-Capillary: 136 mg/dL — ABNORMAL HIGH (ref 65–99)
Glucose-Capillary: 134 mg/dL — ABNORMAL HIGH (ref 65–99)

## 2015-05-19 LAB — COMPREHENSIVE METABOLIC PANEL
ALT: 20 U/L (ref 17–63)
AST: 18 U/L (ref 15–41)
Albumin: 3.6 g/dL (ref 3.5–5.0)
Alkaline Phosphatase: 37 U/L — ABNORMAL LOW (ref 38–126)
Anion gap: 7 (ref 5–15)
BUN: 8 mg/dL (ref 6–20)
CO2: 25 mmol/L (ref 22–32)
Calcium: 8.7 mg/dL — ABNORMAL LOW (ref 8.9–10.3)
Chloride: 104 mmol/L (ref 101–111)
Creatinine, Ser: 0.78 mg/dL (ref 0.61–1.24)
GFR calc Af Amer: >60 mL/min (ref >60)
GFR calc non Af Amer: >60 mL/min (ref >60)
Glucose, Bld: 163 mg/dL — ABNORMAL HIGH (ref 65–99)
Potassium: 3.3 mmol/L — ABNORMAL LOW (ref 3.5–5.1)
Sodium: 136 mmol/L (ref 135–145)
Total Bilirubin: 1.4 mg/dL — ABNORMAL HIGH (ref 0.3–1.2)
Total Protein: 6.1 g/dL — ABNORMAL LOW (ref 6.5–8.1)

## 2015-05-19 MED ORDER — POTASSIUM CHLORIDE 10 MEQ/100ML IV SOLN
10.0000 meq | INTRAVENOUS | Status: AC
  Administered 2015-05-19 (×4): 10 meq via INTRAVENOUS
  Filled 2015-05-19 (×2): qty 100

## 2015-05-19 MED ORDER — HYDROCHLOROTHIAZIDE 25 MG PO TABS
25.0000 mg | ORAL_TABLET | Freq: Every day | ORAL | Status: DC
  Administered 2015-05-19 – 2015-05-21 (×3): 25 mg via ORAL
  Filled 2015-05-19 (×3): qty 1

## 2015-05-19 MED ORDER — SACCHAROMYCES BOULARDII 250 MG PO CAPS
250.0000 mg | ORAL_CAPSULE | Freq: Two times a day (BID) | ORAL | Status: DC
  Administered 2015-05-19 – 2015-05-21 (×5): 250 mg via ORAL
  Filled 2015-05-19 (×5): qty 1

## 2015-05-19 MED ORDER — HYDRALAZINE HCL 20 MG/ML IJ SOLN
10.0000 mg | INTRAMUSCULAR | Status: DC | PRN
  Administered 2015-05-19: 10 mg via INTRAVENOUS
  Filled 2015-05-19: qty 1

## 2015-05-19 MED ORDER — IRBESARTAN 300 MG PO TABS
300.0000 mg | ORAL_TABLET | Freq: Every day | ORAL | Status: DC
  Administered 2015-05-19 – 2015-05-21 (×3): 300 mg via ORAL
  Filled 2015-05-19 (×3): qty 1

## 2015-05-19 MED ORDER — OLMESARTAN MEDOXOMIL-HCTZ 40-25 MG PO TABS: 1.0000 | ORAL_TABLET | Freq: Every day | ORAL | Status: DC

## 2015-05-19 NOTE — Progress Notes (Signed)
[facility] PROGRESS NOTE  Travis Sharp [id number] DOB: Apr 19, 1955 DOA: 05/18/2015 PCP: Collene Mares, PA-C  HPI/Brief narrative Please see admit h and p from 2/1 for details. Briefly, 60 y.o. male with a hx of DM2, HTN, prior colitis, recently diagnosed gastritis by endoscopy, who presented to ED with LUQ pain with radiation to the back with intractable nausea/vomiting. In the ED multiple aniemetics were given, without success. No imaging done. Given intractable n/v, hospitalist consulted for admission  Assessment/Plan: 1. Intractable n/v secondary to Colitis without sepsis on admission  1. Pt presented unable to tolerate any PO 2. Cont antiemetics as tolerated/needed 3. No imaging was done in ED. CT abd on admission later demonstrated evidence of colitis involving the transverse, descending, prox colon 4. Urine drug screen was pos for marijuana - question component of hyperemesis syndrome? 5. No known sick contacts 6. Now on cipro and flagyl 7. Pt feeling better and willing to try clears. Advance diet as tolerated 2. HTN 1. bp stable 2. Cont to monitor 3. DM2 1. Currently on 1/2 dose of home levemir. Plan to return to home dose insulin when more reliably tolerating PO 2. Cont on ssi coverage 4. DVT prophylaxis 1. Heparin subQ  Code Status: Full Family Communication: Pt in room, family at bedside Disposition Plan: Home when tolerating PO and off IV abx   Consultants:    Procedures:    Antibiotics: Anti-infectives    Start     Dose/Rate Route Frequency Ordered Stop   05/18/15 1530  ciprofloxacin (CIPRO) IVPB 400 mg     400 mg 200 mL/hr over 60 Minutes Intravenous Every 12 hours 05/18/15 1507     05/18/15 1500  metroNIDAZOLE (FLAGYL) IVPB 500 mg     500 mg 100 mL/hr over 60 Minutes Intravenous Every 8 hours 05/18/15 1454        HPI/Subjective: Feels better today. Willing to try clears  Objective: Filed Vitals:   05/18/15 1437 05/18/15 2127 05/19/15  0500 05/19/15 1046  BP: 137/92 141/97 125/82 132/80  Pulse: 85 77 71 70  Temp: 98.6 F (37 C) 98.6 F (37 C) 98.7 F (37.1 C) 98.3 F (36.8 C)  TempSrc: Oral Oral Oral Oral  Resp: 20 20 18 17   Height:      Weight:      SpO2: 100% 100% 98% 99%    Intake/Output Summary (Last 24 hours) at 05/19/15 1335 Last data filed at 05/18/15 1438  Gross per 24 hour  Intake      0 ml  Output    200 ml  Net   -200 ml   Filed Weights   05/18/15 0211 05/18/15 0900  Weight: 75.751 kg (167 lb) 73.982 kg (163 lb 1.6 oz)    Exam:   General:  Awake, in nad  Cardiovascular: regular, s1, s2  Respiratory: normal resp effort, no wheezing  Abdomen: soft,nondistended  Musculoskeletal: perfused, no clubbing   Data Reviewed: Basic Metabolic Panel:  Recent Labs Lab 05/18/15 0230 05/19/15 0536  NA 138 136  K 3.9 3.3*  CL 101 104  CO2 28 25  GLUCOSE 140* 163*  BUN 13 8  CREATININE 0.94 0.78  CALCIUM 9.9 8.7*   Liver Function Tests:  Recent Labs Lab 05/18/15 0230 05/19/15 0536  AST 20 18  ALT 24 20  ALKPHOS 46 37*  BILITOT 1.6* 1.4*  PROT 7.5 6.1*  ALBUMIN 4.7 3.6    Recent Labs Lab 05/18/15 0230  LIPASE 58*   No results for input(s):  AMMONIA in the last 168 hours. CBC:  Recent Labs Lab 05/18/15 0230 05/19/15 0536  WBC 7.7 5.2  NEUTROABS 4.9  --   HGB 16.4 14.3  HCT 45.3 40.0  MCV 86.9 86.2  PLT 218 196   Cardiac Enzymes: No results for input(s): CKTOTAL, CKMB, CKMBINDEX, TROPONINI in the last 168 hours. BNP (last 3 results) No results for input(s): BNP in the last 8760 hours.  ProBNP (last 3 results) No results for input(s): PROBNP in the last 8760 hours.  CBG:  Recent Labs Lab 05/18/15 1720 05/18/15 2130 05/19/15 0558 05/19/15 0753 05/19/15 1138  GLUCAP 106* 136* 159* 121* 128*    No results found for this or any previous visit (from the past 240 hour(s)).   Studies: Ct Abdomen Pelvis W Contrast  05/18/2015  CLINICAL DATA:  Left upper  quadrant abdominal pain for several weeks. EXAM: CT ABDOMEN AND PELVIS WITH CONTRAST TECHNIQUE: Multidetector CT imaging of the abdomen and pelvis was performed using the standard protocol following bolus administration of intravenous contrast. CONTRAST:  158mL OMNIPAQUE IOHEXOL 300 MG/ML  SOLN COMPARISON:  CT scan of March 23, 2015. FINDINGS: Visualized lung bases are unremarkable. No significant osseous abnormality is noted. No gallstones are noted. The liver, spleen and pancreas are unremarkable. Adrenal glands and kidneys are unremarkable. No hydronephrosis or renal obstruction is noted. There is no evidence of abdominal aortic aneurysm. The appendix appears normal. There is no evidence of bowel obstruction. Mild fold thickening of the transverse colon is noted, with moderate wall thickening of the descending and proximal sigmoid colon. Minimal surrounding inflammatory changes are noted, and these findings are most consistent with infectious or inflammatory colitis. The colon is nondistended. No abnormal fluid collection is noted. Urinary bladder appears normal. No significant adenopathy is noted. IMPRESSION: Fold thickening of the transverse colon is noted, with moderate wall thickening of the descending and proximal colon with minimal surrounding inflammatory changes. These findings are concerning for infectious or inflammatory colitis. Electronically Signed   By: Marijo Conception, M.D.   On: 05/18/2015 12:06    Scheduled Meds: . ciprofloxacin  400 mg Intravenous Q12H  . heparin  5,000 Units Subcutaneous 3 times per day  . insulin aspart  0-15 Units Subcutaneous 6 times per day  . insulin detemir  10 Units Subcutaneous QHS  . metronidazole  500 mg Intravenous Q8H  . pantoprazole (PROTONIX) IV  40 mg Intravenous Q24H  . saccharomyces boulardii  250 mg Oral BID   Continuous Infusions: . dextrose 5 % and 0.9% NaCl 75 mL/hr at 05/19/15 1041    Principal Problem:   Intractable nausea and  vomiting Active Problems:   Essential hypertension   Controlled type 2 diabetes mellitus without complication ([place])    [facility], [place] [facility] Pager [phone]. If 7PM-7AM, please contact night-coverage at [email or web address], password [facility] 05/19/2015, 1:35 PM

## 2015-05-20 DIAGNOSIS — I10 Essential (primary) hypertension: Secondary | ICD-10-CM | POA: Diagnosis present

## 2015-05-20 DIAGNOSIS — R112 Nausea with vomiting, unspecified: Secondary | ICD-10-CM | POA: Diagnosis present

## 2015-05-20 DIAGNOSIS — Z794 Long term (current) use of insulin: Secondary | ICD-10-CM | POA: Diagnosis not present

## 2015-05-20 DIAGNOSIS — Z841 Family history of disorders of kidney and ureter: Secondary | ICD-10-CM | POA: Diagnosis not present

## 2015-05-20 DIAGNOSIS — E119 Type 2 diabetes mellitus without complications: Secondary | ICD-10-CM | POA: Diagnosis present

## 2015-05-20 DIAGNOSIS — Z823 Family history of stroke: Secondary | ICD-10-CM | POA: Diagnosis not present

## 2015-05-20 DIAGNOSIS — F1210 Cannabis abuse, uncomplicated: Secondary | ICD-10-CM | POA: Diagnosis present

## 2015-05-20 DIAGNOSIS — Z8249 Family history of ischemic heart disease and other diseases of the circulatory system: Secondary | ICD-10-CM | POA: Diagnosis not present

## 2015-05-20 DIAGNOSIS — G43A1 Cyclical vomiting, intractable: Secondary | ICD-10-CM | POA: Diagnosis not present

## 2015-05-20 DIAGNOSIS — Z800 Family history of malignant neoplasm of digestive organs: Secondary | ICD-10-CM | POA: Diagnosis not present

## 2015-05-20 DIAGNOSIS — K529 Noninfective gastroenteritis and colitis, unspecified: Secondary | ICD-10-CM | POA: Diagnosis present

## 2015-05-20 DIAGNOSIS — Z87891 Personal history of nicotine dependence: Secondary | ICD-10-CM | POA: Diagnosis not present

## 2015-05-20 LAB — GLUCOSE, CAPILLARY
Glucose-Capillary: 115 mg/dL — ABNORMAL HIGH (ref 65–99)
Glucose-Capillary: 120 mg/dL — ABNORMAL HIGH (ref 65–99)
Glucose-Capillary: 123 mg/dL — ABNORMAL HIGH (ref 65–99)
Glucose-Capillary: 130 mg/dL — ABNORMAL HIGH (ref 65–99)
Glucose-Capillary: 149 mg/dL — ABNORMAL HIGH (ref 65–99)
Glucose-Capillary: 164 mg/dL — ABNORMAL HIGH (ref 65–99)

## 2015-05-20 LAB — BASIC METABOLIC PANEL
Anion gap: 7 (ref 5–15)
BUN: 6 mg/dL (ref 6–20)
CO2: 26 mmol/L (ref 22–32)
Calcium: 9.1 mg/dL (ref 8.9–10.3)
Chloride: 101 mmol/L (ref 101–111)
Creatinine, Ser: 0.8 mg/dL (ref 0.61–1.24)
GFR calc Af Amer: >60 mL/min (ref >60)
GFR calc non Af Amer: >60 mL/min (ref >60)
Glucose, Bld: 154 mg/dL — ABNORMAL HIGH (ref 65–99)
Potassium: 3.2 mmol/L — ABNORMAL LOW (ref 3.5–5.1)
Sodium: 134 mmol/L — ABNORMAL LOW (ref 135–145)

## 2015-05-20 LAB — CBC
HCT: 40.8 % (ref 39.0–52.0)
Hemoglobin: 15 g/dL (ref 13.0–17.0)
MCH: 31.4 pg (ref 26.0–34.0)
MCHC: 36.8 g/dL — ABNORMAL HIGH (ref 30.0–36.0)
MCV: 85.4 fL (ref 78.0–100.0)
Platelets: 203 10*3/uL (ref 150–400)
RBC: 4.78 MIL/uL (ref 4.22–5.81)
RDW: 12.9 % (ref 11.5–15.5)
WBC: 5.5 10*3/uL (ref 4.0–10.5)

## 2015-05-20 MED ORDER — POTASSIUM CHLORIDE CRYS ER 20 MEQ PO TBCR
40.0000 meq | EXTENDED_RELEASE_TABLET | Freq: Two times a day (BID) | ORAL | Status: AC
  Administered 2015-05-20 (×2): 40 meq via ORAL
  Filled 2015-05-20 (×2): qty 2

## 2015-05-20 NOTE — Progress Notes (Signed)
[facility] PROGRESS NOTE  Travis Sharp [id number] DOB: 17-Jul-1955 DOA: 05/18/2015 PCP: Collene Mares, PA-C  HPI/Brief narrative Please see admit h and p from 2/1 for details. Briefly, 60 y.o. male with a hx of DM2, HTN, prior colitis, recently diagnosed gastritis by endoscopy, who presented to ED with LUQ pain with radiation to the back with intractable nausea/vomiting. In the ED multiple aniemetics were given, without success. No imaging done. Given intractable n/v, hospitalist consulted for admission  Assessment/Plan: 1. Intractable n/v secondary to Colitis without sepsis on admission  1. Pt presented unable to tolerate any PO 2. Cont antiemetics as tolerated/needed 3. No imaging was done in ED. CT abd on admission later demonstrated evidence of colitis involving the transverse, descending, prox colon 4. Urine drug screen was pos for marijuana - question also a component of hyperemesis syndrome? 5. No known sick contacts 6. Patient is continued on cipro and flagyl 7. Pt reports slowly feeling better but not tolerating jello. Continue to advance diet as tolerated 2. HTN 1. bp stable 2. Cont to monitor 3. DM2 1. Currently on 1/2 dose of home levemir. Plan to return to home dose insulin when more reliably tolerating PO 2. Cont on ssi coverage 4. DVT prophylaxis 1. Heparin subQ  Code Status: Full Family Communication: Pt in room, family at bedside Disposition Plan: Home when tolerating soft diet and off IV abx   Consultants:    Procedures:    Antibiotics: Anti-infectives    Start     Dose/Rate Route Frequency Ordered Stop   05/18/15 1530  ciprofloxacin (CIPRO) IVPB 400 mg     400 mg 200 mL/hr over 60 Minutes Intravenous Every 12 hours 05/18/15 1507     05/18/15 1500  metroNIDAZOLE (FLAGYL) IVPB 500 mg     500 mg 100 mL/hr over 60 Minutes Intravenous Every 8 hours 05/18/15 1454        HPI/Subjective: Tolerated broth but not jello  Objective: Filed  Vitals:   05/19/15 1046 05/19/15 1441 05/19/15 2302 05/20/15 0525  BP: 132/80 170/110 132/86 131/93  Pulse: 70 67 86 85  Temp: 98.3 F (36.8 C) 98.4 F (36.9 C) 98.7 F (37.1 C) 98.6 F (37 C)  TempSrc: Oral Oral Oral Oral  Resp: 17 20 20 20   Height:      Weight:      SpO2: 99% 99% 97% 98%    Intake/Output Summary (Last 24 hours) at 05/20/15 1430 Last data filed at 05/20/15 0800  Gross per 24 hour  Intake      0 ml  Output   1100 ml  Net  -1100 ml   Filed Weights   05/18/15 0211 05/18/15 0900  Weight: 75.751 kg (167 lb) 73.982 kg (163 lb 1.6 oz)    Exam:   General:  Awake, in nad, laying in bed  Cardiovascular: regular, s1, s2  Respiratory: normal resp effort, no wheezing  Abdomen: soft,nondistended, pos bs  Musculoskeletal: perfused, no clubbing   Data Reviewed: Basic Metabolic Panel:  Recent Labs Lab 05/18/15 0230 05/19/15 0536 05/20/15 0613  NA 138 136 134*  K 3.9 3.3* 3.2*  CL 101 104 101  CO2 28 25 26   GLUCOSE 140* 163* 154*  BUN 13 8 6   CREATININE 0.94 0.78 0.80  CALCIUM 9.9 8.7* 9.1   Liver Function Tests:  Recent Labs Lab 05/18/15 0230 05/19/15 0536  AST 20 18  ALT 24 20  ALKPHOS 46 37*  BILITOT 1.6* 1.4*  PROT 7.5 6.1*  ALBUMIN  4.7 3.6    Recent Labs Lab 05/18/15 0230  LIPASE 58*   No results for input(s): AMMONIA in the last 168 hours. CBC:  Recent Labs Lab 05/18/15 0230 05/19/15 0536 05/20/15 0613  WBC 7.7 5.2 5.5  NEUTROABS 4.9  --   --   HGB 16.4 14.3 15.0  HCT 45.3 40.0 40.8  MCV 86.9 86.2 85.4  PLT 218 196 203   Cardiac Enzymes: No results for input(s): CKTOTAL, CKMB, CKMBINDEX, TROPONINI in the last 168 hours. BNP (last 3 results) No results for input(s): BNP in the last 8760 hours.  ProBNP (last 3 results) No results for input(s): PROBNP in the last 8760 hours.  CBG:  Recent Labs Lab 05/19/15 2130 05/20/15 0032 05/20/15 0523 05/20/15 0715 05/20/15 1133  GLUCAP 134* 115* 164* 130* 120*     No results found for this or any previous visit (from the past 240 hour(s)).   Studies: No results found.  Scheduled Meds: . ciprofloxacin  400 mg Intravenous Q12H  . heparin  5,000 Units Subcutaneous 3 times per day  . irbesartan  300 mg Oral Daily   And  . hydrochlorothiazide  25 mg Oral Daily  . insulin aspart  0-15 Units Subcutaneous 6 times per day  . insulin detemir  10 Units Subcutaneous QHS  . metronidazole  500 mg Intravenous Q8H  . pantoprazole (PROTONIX) IV  40 mg Intravenous Q24H  . potassium chloride  40 mEq Oral BID  . saccharomyces boulardii  250 mg Oral BID   Continuous Infusions: . dextrose 5 % and 0.9% NaCl 75 mL/hr at 05/20/15 0425    Principal Problem:   Intractable nausea and vomiting Active Problems:   Essential hypertension   Controlled type 2 diabetes mellitus without complication ([place])    Travis Sharp, [place] [facility] Pager [phone]. If 7PM-7AM, please contact night-coverage at [email or web address], password [facility] 05/20/2015, 2:30 PM

## 2015-05-21 DIAGNOSIS — G43A1 Cyclical vomiting, intractable: Secondary | ICD-10-CM

## 2015-05-21 LAB — BASIC METABOLIC PANEL
Anion gap: 8 (ref 5–15)
BUN: 7 mg/dL (ref 6–20)
CO2: 26 mmol/L (ref 22–32)
Calcium: 9.1 mg/dL (ref 8.9–10.3)
Chloride: 99 mmol/L — ABNORMAL LOW (ref 101–111)
Creatinine, Ser: 0.87 mg/dL (ref 0.61–1.24)
GFR calc Af Amer: >60 mL/min (ref >60)
GFR calc non Af Amer: >60 mL/min (ref >60)
Glucose, Bld: 166 mg/dL — ABNORMAL HIGH (ref 65–99)
Potassium: 3.4 mmol/L — ABNORMAL LOW (ref 3.5–5.1)
Sodium: 133 mmol/L — ABNORMAL LOW (ref 135–145)

## 2015-05-21 LAB — GLUCOSE, CAPILLARY
Glucose-Capillary: 120 mg/dL — ABNORMAL HIGH (ref 65–99)
Glucose-Capillary: 122 mg/dL — ABNORMAL HIGH (ref 65–99)
Glucose-Capillary: 134 mg/dL — ABNORMAL HIGH (ref 65–99)
Glucose-Capillary: 135 mg/dL — ABNORMAL HIGH (ref 65–99)

## 2015-05-21 LAB — MAGNESIUM: Magnesium: 1.8 mg/dL (ref 1.7–2.4)

## 2015-05-21 MED ORDER — METRONIDAZOLE 500 MG PO TABS: 500.0000 mg | ORAL_TABLET | Freq: Three times a day (TID) | ORAL | Status: DC

## 2015-05-21 MED ORDER — CIPROFLOXACIN HCL 500 MG PO TABS: 500.0000 mg | ORAL_TABLET | Freq: Two times a day (BID) | ORAL | Status: DC

## 2015-05-21 MED ORDER — ONDANSETRON HCL 4 MG PO TABS: 4.0000 mg | ORAL_TABLET | Freq: Three times a day (TID) | ORAL | Status: DC | PRN

## 2015-05-21 MED ORDER — SACCHAROMYCES BOULARDII 250 MG PO CAPS: 250.0000 mg | ORAL_CAPSULE | Freq: Two times a day (BID) | ORAL | Status: DC

## 2015-05-21 MED ORDER — ONDANSETRON HCL 4 MG/2ML IJ SOLN
4.0000 mg | Freq: Four times a day (QID) | INTRAMUSCULAR | Status: DC | PRN
  Administered 2015-05-21: 4 mg via INTRAVENOUS
  Filled 2015-05-21: qty 2

## 2015-05-21 MED ORDER — LORAZEPAM 2 MG/ML IJ SOLN
1.0000 mg | Freq: Four times a day (QID) | INTRAMUSCULAR | Status: DC | PRN
  Administered 2015-05-21: 1 mg via INTRAVENOUS
  Filled 2015-05-21: qty 1

## 2015-05-21 MED ORDER — POTASSIUM CHLORIDE CRYS ER 20 MEQ PO TBCR
40.0000 meq | EXTENDED_RELEASE_TABLET | Freq: Two times a day (BID) | ORAL | Status: DC
  Administered 2015-05-21: 40 meq via ORAL
  Filled 2015-05-21: qty 2

## 2015-05-21 NOTE — Discharge Summary (Signed)
Physician Discharge Summary  DAXON GREASON [id number] DOB: November 26, 1955 DOA: 05/18/2015  PCP: Collene Mares, PA-C  Admit date: 05/18/2015 Discharge date: 05/21/2015  Time spent: 20 minutes  Recommendations for Outpatient Follow-up:  1. Follow up with PCP in 1-2 weeks 2. Patient instructed to abstain from marijuana   Discharge Diagnoses:  Principal Problem:   Intractable nausea and vomiting Active Problems:   Essential hypertension   Controlled type 2 diabetes mellitus without complication [facility])   Discharge Condition: Improved  Diet recommendation: Diabetic as tolerated  Filed Weights   05/18/15 0211 05/18/15 0900  Weight: 75.751 kg (167 lb) 73.982 kg (163 lb 1.6 oz)    History of present illness:  Please review dictated H and P from 2/1 for details. Briefly, 60 y.o. male with a hx of DM2, HTN, prior colitis, recently diagnosed gastritis by endoscopy, who presented to ED with LUQ pain with radiation to the back with intractable nausea/vomiting. In the ED multiple aniemetics were given, without success. No imaging done. Given intractable n/v, hospitalist consulted for admission  Hospital Course:  1. Intractable n/v secondary to Colitis without sepsis on admission  1. Pt presented unable to tolerate any PO 2. Cont antiemetics as tolerated/needed 3. No imaging was done in ED, however CT abd on admission later demonstrated evidence of colitis involving the transverse, descending, prox colon 4. Urine drug screen was also pos for marijuana - question also a component of hyperemesis syndrome? 5. No known sick contacts 6. Patient was continued on cipro and flagyl with gradual improvement in symptoms and success advancing diet 7. Patient will complete course of cipro/flagyl PO on discharge 2. HTN 1. bp stable 2. Cont to monitor 3. DM2 1. Initially had been on 1/2 dose of home levemir secondary to decreased PO. Plan to return to home dose insulin when more reliably tolerating PO on  discharge 4. DVT prophylaxis 1. Heparin subQ while admitted 5. Marijuana abuse 1. Patient states he is aware and is concerned of hyperemesis syndrome from cannabis. He states, "no more."  Discharge Exam: Filed Vitals:   05/20/15 2042 05/21/15 0449 05/21/15 1100 05/21/15 1517  BP: 143/97 133/89  139/94  Pulse:  89  96  Temp:  98.6 F (37 C)  98.5 F (36.9 C)  TempSrc:  Oral  Oral  Resp:  20  20  Height:      Weight:      SpO2:  100% 97% 100%    General: Awake,in nad Cardiovascular: regular, s1, s2 Respiratory: normal resp effort, no wheezing  Discharge Instructions     Medication List    STOP taking these medications        Na Sulfate-K Sulfate-Mg Sulf Soln      TAKE these medications        BENICAR HCT 40-25 MG tablet  Generic drug:  olmesartan-hydrochlorothiazide  Take 1 tablet by mouth daily. RESTART IN 2 DAYS.     ciprofloxacin 500 MG tablet  Commonly known as:  CIPRO  Take 1 tablet (500 mg total) by mouth 2 (two) times daily.     metroNIDAZOLE 500 MG tablet  Commonly known as:  FLAGYL  Take 1 tablet (500 mg total) by mouth 3 (three) times daily.     ondansetron 4 MG tablet  Commonly known as:  ZOFRAN  Take 1 tablet (4 mg total) by mouth every 8 (eight) hours as needed for nausea or vomiting.     pantoprazole 40 MG tablet  Commonly known as:  PROTONIX  Take 1 tablet (40 mg total) by mouth daily.     saccharomyces boulardii 250 MG capsule  Commonly known as:  FLORASTOR  Take 1 capsule (250 mg total) by mouth 2 (two) times daily.     TRESIBA FLEXTOUCH 100 UNIT/ML Sopn  Generic drug:  Insulin Degludec  Inject 22 Units into the skin daily.       No Known Allergies Follow-up Information    Follow up with MANN, BENJAMIN, PA-C. Schedule an appointment as soon as possible for a visit in 1 week.   Specialties:  Physician Assistant, Internal Medicine   Why:  Hospital follow up   Contact information:   [address] [place]  [address] [phone]        The results of significant diagnostics from this hospitalization (including imaging, microbiology, ancillary and laboratory) are listed below for reference.    Significant Diagnostic Studies: Ct Abdomen Pelvis W Contrast  05/18/2015  CLINICAL DATA:  Left upper quadrant abdominal pain for several weeks. EXAM: CT ABDOMEN AND PELVIS WITH CONTRAST TECHNIQUE: Multidetector CT imaging of the abdomen and pelvis was performed using the standard protocol following bolus administration of intravenous contrast. CONTRAST:  127mL OMNIPAQUE IOHEXOL 300 MG/ML  SOLN COMPARISON:  CT scan of March 23, 2015. FINDINGS: Visualized lung bases are unremarkable. No significant osseous abnormality is noted. No gallstones are noted. The liver, spleen and pancreas are unremarkable. Adrenal glands and kidneys are unremarkable. No hydronephrosis or renal obstruction is noted. There is no evidence of abdominal aortic aneurysm. The appendix appears normal. There is no evidence of bowel obstruction. Mild fold thickening of the transverse colon is noted, with moderate wall thickening of the descending and proximal sigmoid colon. Minimal surrounding inflammatory changes are noted, and these findings are most consistent with infectious or inflammatory colitis. The colon is nondistended. No abnormal fluid collection is noted. Urinary bladder appears normal. No significant adenopathy is noted. IMPRESSION: Fold thickening of the transverse colon is noted, with moderate wall thickening of the descending and proximal colon with minimal surrounding inflammatory changes. These findings are concerning for infectious or inflammatory colitis. Electronically Signed   By: Marijo Conception, M.D.   On: 05/18/2015 12:06    Microbiology: No results found for this or any previous visit (from the past 240 hour(s)).   Labs: Basic Metabolic Panel:  Recent Labs Lab 05/18/15 0230 05/19/15 0536 05/20/15 0613 05/21/15 0624   NA 138 136 134* 133*  K 3.9 3.3* 3.2* 3.4*  CL 101 104 101 99*  CO2 28 25 26 26   GLUCOSE 140* 163* 154* 166*  BUN 13 8 6 7   CREATININE 0.94 0.78 0.80 0.87  CALCIUM 9.9 8.7* 9.1 9.1  MG  --   --   --  1.8   Liver Function Tests:  Recent Labs Lab 05/18/15 0230 05/19/15 0536  AST 20 18  ALT 24 20  ALKPHOS 46 37*  BILITOT 1.6* 1.4*  PROT 7.5 6.1*  ALBUMIN 4.7 3.6    Recent Labs Lab 05/18/15 0230  LIPASE 58*   No results for input(s): AMMONIA in the last 168 hours. CBC:  Recent Labs Lab 05/18/15 0230 05/19/15 0536 05/20/15 0613  WBC 7.7 5.2 5.5  NEUTROABS 4.9  --   --   HGB 16.4 14.3 15.0  HCT 45.3 40.0 40.8  MCV 86.9 86.2 85.4  PLT 218 196 203   Cardiac Enzymes: No results for input(s): CKTOTAL, CKMB, CKMBINDEX, TROPONINI in the last 168 hours. BNP: BNP (last 3  results) No results for input(s): BNP in the last 8760 hours.  ProBNP (last 3 results) No results for input(s): PROBNP in the last 8760 hours.  CBG:  Recent Labs Lab 05/20/15 2040 05/21/15 0020 05/21/15 0447 05/21/15 0735 05/21/15 1141  GLUCAP 123* 122* 134* 135* 120*       Signed:  Verdie Barrows K  [facility] 05/21/2015, 4:13 PM

## 2015-05-21 NOTE — Progress Notes (Signed)
Patient d/c home with family left floor via wheelchair, d/c instructions reviewed with patient and wife verbalized understanding. Discharge instructions and prescriptions given to patient. Patient left floor via wheelchair accompanied by staff no c/o pain at d/c. Travis Sharp [employer]

## 2015-05-21 NOTE — Progress Notes (Signed)
ANTIBIOTIC CONSULT NOTE   Pharmacy Consult for Cipro Indication: intra-abdominal infection  No Known Allergies  Patient Measurements: Height: 5\' 9"  (175.3 cm) Weight: 163 lb 1.6 oz (73.982 kg) IBW/kg (Calculated) : 70.7  Vital Signs: Temp: 98.6 F (37 C) (02/04 0449) Temp Source: Oral (02/04 0449) BP: 133/89 mmHg (02/04 0449) Pulse Rate: 89 (02/04 0449) Intake/Output from previous day: 02/03 0701 - 02/04 0700 In: 3013.8 [P.O.:720; I.V.:1993.8; IV Piggyback:300] Out: 625 [Urine:625] Intake/Output from this shift:    Labs:  Recent Labs  05/19/15 0536 05/20/15 0613 05/21/15 0624  WBC 5.2 5.5  --   HGB 14.3 15.0  --   PLT 196 203  --   CREATININE 0.78 0.80 0.87   Estimated Creatinine Clearance: 92.6 mL/min (by C-G formula based on Cr of 0.87). No results for input(s): VANCOTROUGH, VANCOPEAK, VANCORANDOM, GENTTROUGH, GENTPEAK, GENTRANDOM, TOBRATROUGH, TOBRAPEAK, TOBRARND, AMIKACINPEAK, AMIKACINTROU, AMIKACIN in the last 72 hours.   Microbiology: No results found for this or any previous visit (from the past 720 hour(s)).  Medical History: Past Medical History  Diagnosis Date  . Hypertension   . Diabetes mellitus   . Colitis 03/23/2015    Medications:  See med rec Assessment: 60 yo presents to ED with intractable nausea/vomiting and abdominal pain. Recent endoscopy showed gastritis. CT findings are concerning for infectious or inflammatory colitis. Empiric tx with cipro and flagyl  Goal of Therapy:  eradication of infection  Plan:  Cont cipro 400mg  IV q12h F/u change to po when tolerating diet  Thanks for allowing pharmacy to be a part of this patient's care.  Excell Seltzer, PharmD Clinical Pharmacist   05/21/2015,9:30 AM

## 2015-05-30 ENCOUNTER — Telehealth: Payer: Self-pay | Admitting: Gastroenterology

## 2015-05-30 NOTE — Telephone Encounter (Signed)
Please call pt. HIS stomach Bx shows gastritis.    DRINK WATER TO KEEP YOUR URINE LIGHT YELLOW.  FOLLOW A HIGH FIBER/LOW FAT DIET. AVOID ITEMS THAT CAUSE BLOATING.   CONTINUE PROTONIX. TAKE 30 MINUTES PRIOR TO BREAKFAST.  OPV IN 4 MOS E30 ABDOMINAL PAIN/DIVERTICULITIS.  Next colonoscopy in 10 years.

## 2015-05-31 ENCOUNTER — Encounter: Payer: Self-pay | Admitting: Gastroenterology

## 2015-05-31 NOTE — Telephone Encounter (Signed)
Called pt and LMOM to call office back  

## 2015-05-31 NOTE — Telephone Encounter (Signed)
Reminder in epic °

## 2015-06-02 NOTE — Telephone Encounter (Signed)
Pt is aware of results. 

## 2015-07-04 ENCOUNTER — Ambulatory Visit (INDEPENDENT_AMBULATORY_CARE_PROVIDER_SITE_OTHER): Payer: BLUE CROSS/BLUE SHIELD | Admitting: "Endocrinology

## 2015-07-04 ENCOUNTER — Encounter: Payer: Self-pay | Admitting: "Endocrinology

## 2015-07-04 VITALS — BP 118/88 | HR 110 | Ht 70.0 in | Wt 162.0 lb

## 2015-07-04 DIAGNOSIS — E1159 Type 2 diabetes mellitus with other circulatory complications: Secondary | ICD-10-CM | POA: Diagnosis not present

## 2015-07-04 DIAGNOSIS — I10 Essential (primary) hypertension: Secondary | ICD-10-CM | POA: Diagnosis not present

## 2015-07-04 MED ORDER — INSULIN ASPART 100 UNIT/ML FLEXPEN: 8.0000 [IU] | PEN_INJECTOR | Freq: Three times a day (TID) | SUBCUTANEOUS | Status: DC

## 2015-07-04 NOTE — Progress Notes (Signed)
Subjective:    Patient ID: Travis Sharp, male    DOB: 06/05/1955. Patient is being seen in consultation for management of diabetes requested by  Collene Mares, PA-C  Past Medical History  Diagnosis Date  . Hypertension   . Diabetes mellitus   . Colitis 03/23/2015   Past Surgical History  Procedure Laterality Date  . Tonsillectomy    . Colonoscopy with propofol N/A 05/10/2015    Procedure: COLONOSCOPY WITH PROPOFOL;  Surgeon: Danie Binder, MD;  Location: [facility];  Service: Endoscopy;  Laterality: N/A;  0730  . Esophagogastroduodenoscopy (egd) with propofol N/A 05/10/2015    Procedure: ESOPHAGOGASTRODUODENOSCOPY (EGD) WITH PROPOFOL;  Surgeon: Danie Binder, MD;  Location: [facility];  Service: Endoscopy;  Laterality: N/A;  . Savory dilation N/A 05/10/2015    Procedure: SAVORY DILATION;  Surgeon: Danie Binder, MD;  Location: [facility];  Service: Endoscopy;  Laterality: N/A;  . Biopsy  05/10/2015    Procedure: BIOPSY;  Surgeon: Danie Binder, MD;  Location: [facility];  Service: Endoscopy;;  gastric bx's   Social History   Social History  . Marital Status: Married    Spouse Name: N/A  . Number of Children: N/A  . Years of Education: N/A   Occupational History  . [employer]    Social History Main Topics  . Smoking status: Former Smoker -- 1.00 packs/day for 28 years    Quit date: 05/04/2005  . Smokeless tobacco: None  . Alcohol Use: No  . Drug Use: Yes    Special: Marijuana     Comment: marijuana - last used 1 month ago  . Sexual Activity: Not Asked   Other Topics Concern  . None   Social History Narrative   Outpatient Encounter Prescriptions as of 07/04/2015  Medication Sig  . Insulin Degludec (TRESIBA FLEXTOUCH) 100 UNIT/ML SOPN Inject 20 Units into the skin at bedtime.  Marland Kitchen BENICAR HCT 40-25 MG tablet Take 1 tablet by mouth daily. RESTART IN 2 DAYS. (Patient taking differently: Take 1 tablet by mouth daily. )  . insulin aspart (NOVOLOG  FLEXPEN) 100 UNIT/ML FlexPen Inject 8-14 Units into the skin 3 (three) times daily with meals.  . ondansetron (ZOFRAN) 4 MG tablet Take 1 tablet (4 mg total) by mouth every 8 (eight) hours as needed for nausea or vomiting.  . pantoprazole (PROTONIX) 40 MG tablet Take 1 tablet (40 mg total) by mouth daily.  Marland Kitchen saccharomyces boulardii (FLORASTOR) 250 MG capsule Take 1 capsule (250 mg total) by mouth 2 (two) times daily.  . [DISCONTINUED] ciprofloxacin (CIPRO) 500 MG tablet Take 1 tablet (500 mg total) by mouth 2 (two) times daily.  . [DISCONTINUED] metroNIDAZOLE (FLAGYL) 500 MG tablet Take 1 tablet (500 mg total) by mouth 3 (three) times daily.   No facility-administered encounter medications on file as of 07/04/2015.   ALLERGIES: No Known Allergies VACCINATION STATUS: Immunization History  Administered Date(s) Administered  . Influenza,inj,Quad PF,36+ Mos 03/24/2015  . Pneumococcal Polysaccharide-23 03/24/2015    Diabetes He presents for his initial diabetic visit. He has type 2 diabetes mellitus. Onset time: He was diagnosed at approximate age of 60 years. His disease course has been worsening (This patient was seen by myself more than 3 years ago, displayed a very serious noncompliance, the separate from care and now being re-referred for diabetes care. In the meantime he lost about 50 pounds .). There are no hypoglycemic associated symptoms. Pertinent negatives for hypoglycemia include no  confusion, headaches, pallor or seizures. Associated symptoms include polydipsia, polyphagia, polyuria and visual change. Pertinent negatives for diabetes include no chest pain, no fatigue and no weakness. There are no hypoglycemic complications. Symptoms are worsening. Diabetic complications include a CVA. (Serious noncompliance.) Risk factors for coronary artery disease include dyslipidemia, diabetes mellitus, hypertension, male sex and tobacco exposure. Current diabetic treatment includes insulin injections.  His weight is decreasing steadily (The current weight loss his abnormal due to interval diagnosis of colitis accompanied by insulin deficiency.). He is following a generally unhealthy diet. When asked about meal planning, he reported none. He has not had a previous visit with a dietitian. He rarely participates in exercise. Home blood sugar record trend: He did not bring any meter nor blood glucose readings to review with him. He admits that he is not monitoring blood glucose regularly. An ACE inhibitor/angiotensin II receptor blocker is being taken.  Hypertension This is a chronic problem. The current episode started more than 1 year ago. Pertinent negatives include no chest pain, headaches, neck pain, palpitations or shortness of breath. Risk factors for coronary artery disease include dyslipidemia, diabetes mellitus, male gender and smoking/tobacco exposure. Past treatments include angiotensin blockers. Hypertensive end-organ damage includes CVA.      Review of Systems  Constitutional: Positive for unexpected weight change. Negative for fever, chills and fatigue.  HENT: Negative for dental problem, mouth sores and trouble swallowing.   Eyes: Negative for visual disturbance.  Respiratory: Negative for cough, choking, chest tightness, shortness of breath and wheezing.   Cardiovascular: Negative for chest pain, palpitations and leg swelling.  Gastrointestinal: Negative for nausea, vomiting, abdominal pain, diarrhea, constipation and abdominal distention.  Endocrine: Positive for polydipsia, polyphagia and polyuria.  Genitourinary: Negative for dysuria, urgency, hematuria and flank pain.  Musculoskeletal: Negative for myalgias, back pain, gait problem and neck pain.  Skin: Negative for pallor, rash and wound.  Neurological: Negative for seizures, syncope, weakness, numbness and headaches.  Psychiatric/Behavioral: Negative.  Negative for confusion and dysphoric mood.    Objective:    BP 118/88  mmHg  Pulse 110  Ht 5\' 10"  (1.778 m)  Wt 162 lb (73.483 kg)  BMI 23.24 kg/m2  SpO2 97%  Wt Readings from Last 3 Encounters:  07/04/15 162 lb (73.483 kg)  05/18/15 163 lb 1.6 oz (73.982 kg)  05/05/15 165 lb (74.844 kg)    Physical Exam  Constitutional: He is oriented to person, place, and time. He appears well-developed and well-nourished. He is cooperative. No distress.  HENT:  Head: Normocephalic and atraumatic.  Eyes: EOM are normal.  Neck: Normal range of motion. Neck supple. No tracheal deviation present. No thyromegaly present.  Cardiovascular: Normal rate, S1 normal, S2 normal and normal heart sounds.  Exam reveals no gallop.   No murmur heard. Pulses:      Dorsalis pedis pulses are 1+ on the right side, and 1+ on the left side.       Posterior tibial pulses are 1+ on the right side, and 1+ on the left side.  Pulmonary/Chest: Breath sounds normal. No respiratory distress. He has no wheezes.  Abdominal: Soft. Bowel sounds are normal. He exhibits no distension. There is no tenderness. There is no guarding and no CVA tenderness.  Musculoskeletal: He exhibits no edema.       Right shoulder: He exhibits no swelling and no deformity.  Neurological: He is alert and oriented to person, place, and time. He has normal strength and normal reflexes. No cranial nerve deficit or sensory  deficit. Gait normal.  Skin: Skin is warm and dry. No rash noted. No cyanosis. Nails show no clubbing.  Psychiatric: He has a normal mood and affect. His speech is normal and behavior is normal. Judgment and thought content normal. Cognition and memory are normal.     CMP ( most recent) CMP     Component Value Date/Time   NA 133* 05/21/2015 0624   K 3.4* 05/21/2015 0624   CL 99* 05/21/2015 0624   CO2 26 05/21/2015 0624   GLUCOSE 166* 05/21/2015 0624   BUN 7 05/21/2015 0624   CREATININE 0.87 05/21/2015 0624   CALCIUM 9.1 05/21/2015 0624   PROT 6.1* 05/19/2015 0536   ALBUMIN 3.6 05/19/2015 0536    AST 18 05/19/2015 0536   ALT 20 05/19/2015 0536   ALKPHOS 37* 05/19/2015 0536   BILITOT 1.4* 05/19/2015 0536   GFRNONAA >60 05/21/2015 0624   GFRAA >60 05/21/2015 0624          Assessment & Plan:   1. Type 2 diabetes mellitus with vascular disease ([place])   - Patient has currently uncontrolled symptomatic type 2 DM since  60 years of age. He does not have recent A1c however from August   it was greater than 14%. No recent labs to review.  -His diabetes is complicated by  CVA and noncompliance and patient remains at a high risk for more acute and chronic complications of diabetes which include CAD, CVA, CKD, retinopathy, and neuropathy. These are all discussed in detail with the patient.  - I have counseled the patient on diet management , by adopting a carbohydrate restricted/protein rich diet.  - Suggestion is made for patient to avoid simple carbohydrates   from their diet including Cakes , Desserts, Ice Cream,  Soda (  diet and regular) , Sweet Tea , Candies,  Chips, Cookies, Artificial Sweeteners,   and "Sugar-free" Products . This will help patient to have stable blood glucose profile and potentially avoid unintended weight gain.  - I encouraged the patient to switch to  unprocessed or minimally processed complex starch and increased protein intake (animal or plant source), fruits, and vegetables.  - Patient is advised to stick to a routine mealtimes to eat 3 meals  a day and avoid unnecessary snacks ( to snack only to correct hypoglycemia).  - The patient will be scheduled with Jearld Fenton, RDN, CDE for individualized DM education.  - I have approached patient with the following individualized plan to manage diabetes and patient agrees:   - I  will proceed to initiate basal insulin Tresiba 20 units QHS, and prandial insulin  NovoLog 8 units TIDAC for pre-meal BG readings of 90-150mg /dl, plus patient specific correction dose for unexpected hyperglycemia above 150mg /dl,  associated with strict monitoring of glucose  AC and HS. - Patient is warned not to take insulin without proper monitoring per orders. -Adjustment parameters are given for hypo and hyperglycemia in writing. -Patient is encouraged to call clinic for blood glucose levels less than 70 or above 300 mg /dl. - Insulin needs the the only and safest therapy she should be getting at this point.  - Patient specific target  A1c;  LDL, HDL, Triglycerides, and  Waist Circumference were discussed in detail.  2) BP/HTN:  Controlled. Continue current medications including ACEI/ARB. 3) Lipids/HPL:  Lipid panel unknown, I would obtain fasting lipid profile along with his next blood work.Marland Kitchen  4)  Weight/Diet: CDE Consult will be initiated , exercise, and detailed carbohydrates information provided.  5) Chronic Care/Health Maintenance:  -Patient is on ACEI/ARB  medications and encouraged to continue to follow up with Ophthalmology, Podiatrist at least yearly or according to recommendations, and advised to  stay away from smoking. I have recommended yearly flu vaccine and pneumonia vaccination at least every 5 years; moderate intensity exercise for up to 150 minutes weekly; and  sleep for at least 7 hours a day.  - 60 minutes of time was spent on the care of this patient , 50% of which was applied for counseling on diabetes complications and their preventions.  - Patient to bring meter and  blood glucose logs during their next visit.   - I advised patient to maintain close follow up with Collene Mares, PA-C for primary care needs.  Follow up plan: - Return in about 1 week (around 07/11/2015) for diabetes, high blood pressure, high cholesterol.  Glade Lloyd, MD Phone: [phone]  Fax: [phone]   07/04/2015, 5:23 PM

## 2015-07-04 NOTE — Patient Instructions (Signed)

## 2015-07-11 ENCOUNTER — Encounter: Payer: Self-pay | Admitting: Internal Medicine

## 2015-07-14 ENCOUNTER — Encounter: Payer: BLUE CROSS/BLUE SHIELD | Admitting: "Endocrinology

## 2015-07-15 ENCOUNTER — Encounter: Payer: Self-pay | Admitting: "Endocrinology

## 2015-07-15 ENCOUNTER — Ambulatory Visit (INDEPENDENT_AMBULATORY_CARE_PROVIDER_SITE_OTHER): Payer: BLUE CROSS/BLUE SHIELD | Admitting: "Endocrinology

## 2015-07-15 VITALS — BP 128/83 | HR 90 | Ht 70.0 in | Wt 168.0 lb

## 2015-07-15 DIAGNOSIS — E1159 Type 2 diabetes mellitus with other circulatory complications: Secondary | ICD-10-CM | POA: Diagnosis not present

## 2015-07-15 DIAGNOSIS — I10 Essential (primary) hypertension: Secondary | ICD-10-CM

## 2015-07-15 DIAGNOSIS — R634 Abnormal weight loss: Secondary | ICD-10-CM | POA: Diagnosis not present

## 2015-07-15 NOTE — Progress Notes (Signed)
Outpatient Encounter Prescriptions as of 07/14/2015  Medication Sig  . BENICAR HCT 40-25 MG tablet Take 1 tablet by mouth daily. RESTART IN 2 DAYS. (Patient taking differently: Take 1 tablet by mouth daily. )  . insulin aspart (NOVOLOG FLEXPEN) 100 UNIT/ML FlexPen Inject 8-14 Units into the skin 3 (three) times daily with meals.  . Insulin Degludec (TRESIBA FLEXTOUCH) 100 UNIT/ML SOPN Inject 20 Units into the skin at bedtime.  . ondansetron (ZOFRAN) 4 MG tablet Take 1 tablet (4 mg total) by mouth every 8 (eight) hours as needed for nausea or vomiting.  . pantoprazole (PROTONIX) 40 MG tablet Take 1 tablet (40 mg total) by mouth daily.  Marland Kitchen saccharomyces boulardii (FLORASTOR) 250 MG capsule Take 1 capsule (250 mg total) by mouth 2 (two) times daily.   No facility-administered encounter medications on file as of 07/14/2015.   ALLERGIES: No Known Allergies VACCINATION STATUS: Immunization History  Administered Date(s) Administered  . Influenza,inj,Quad PF,36+ Mos 03/24/2015  . Pneumococcal Polysaccharide-23 03/24/2015    Diabetes He presents for his follow-up diabetic visit. He has type 2 diabetes mellitus. Onset time: He was diagnosed at approximate age of 38 years. His disease course has been improving (This patient was seen by myself more than 3 years ago, displayed a very serious noncompliance, the separate from care and now being re-referred for diabetes care. In the meantime he lost about 50 pounds .). There are no hypoglycemic associated symptoms. Pertinent negatives for hypoglycemia include no confusion, headaches, pallor or seizures. Associated symptoms include polyphagia and visual change. Pertinent negatives for diabetes include no chest pain, no fatigue, no polydipsia, no polyuria and no weakness. There are no hypoglycemic complications. Symptoms are improving. Diabetic complications include a CVA. (Serious noncompliance.) Risk factors for coronary artery disease include dyslipidemia,  diabetes mellitus, hypertension, male sex and tobacco exposure. Current diabetic treatment includes insulin injections. His weight is decreasing steadily (He has gained 6 pounds since last visit, this is a good development for him.). He is following a generally unhealthy diet. When asked about meal planning, he reported none. He has not had a previous visit with a dietitian. He rarely participates in exercise. Home blood sugar record trend: He did not bring any meter nor blood glucose readings to review with him. He admits that he is not monitoring blood glucose regularly. His breakfast blood glucose range is generally 130-140 mg/dl. His lunch blood glucose range is generally 130-140 mg/dl. His dinner blood glucose range is generally 130-140 mg/dl. His overall blood glucose range is 130-140 mg/dl. An ACE inhibitor/angiotensin II receptor blocker is being taken.  Hypertension This is a chronic problem. The current episode started more than 1 year ago. Pertinent negatives include no chest pain, headaches, neck pain, palpitations or shortness of breath. Risk factors for coronary artery disease include dyslipidemia, diabetes mellitus, male gender and smoking/tobacco exposure. Past treatments include angiotensin blockers. Hypertensive end-organ damage includes CVA.      Review of Systems  Constitutional: Positive for unexpected weight change. Negative for fever, chills and fatigue.  HENT: Negative for dental problem, mouth sores and trouble swallowing.   Eyes: Negative for visual disturbance.  Respiratory: Negative for cough, choking, chest tightness, shortness of breath and wheezing.   Cardiovascular: Negative for chest pain, palpitations and leg swelling.  Gastrointestinal: Negative for nausea, vomiting, abdominal pain, diarrhea, constipation and abdominal distention.  Endocrine: Positive for polyphagia. Negative for polydipsia and polyuria.  Genitourinary: Negative for dysuria, urgency, hematuria and  flank pain.  Musculoskeletal: Negative for myalgias,  back pain, gait problem and neck pain.  Skin: Negative for pallor, rash and wound.  Neurological: Negative for seizures, syncope, weakness, numbness and headaches.  Psychiatric/Behavioral: Negative.  Negative for confusion and dysphoric mood.    Objective:    There were no vitals taken for this visit.  Wt Readings from Last 3 Encounters:  07/15/15 168 lb (76.204 kg)  07/04/15 162 lb (73.483 kg)  05/18/15 163 lb 1.6 oz (73.982 kg)    Physical Exam  Constitutional: He is oriented to person, place, and time. He appears well-developed and well-nourished. He is cooperative. No distress.  HENT:  Head: Normocephalic and atraumatic.  Eyes: EOM are normal.  Neck: Normal range of motion. Neck supple. No tracheal deviation present. No thyromegaly present.  Cardiovascular: Normal rate, S1 normal, S2 normal and normal heart sounds.  Exam reveals no gallop.   No murmur heard. Pulses:      Dorsalis pedis pulses are 1+ on the right side, and 1+ on the left side.       Posterior tibial pulses are 1+ on the right side, and 1+ on the left side.  Pulmonary/Chest: Breath sounds normal. No respiratory distress. He has no wheezes.  Abdominal: Soft. Bowel sounds are normal. He exhibits no distension. There is no tenderness. There is no guarding and no CVA tenderness.  Musculoskeletal: He exhibits no edema.       Right shoulder: He exhibits no swelling and no deformity.  Neurological: He is alert and oriented to person, place, and time. He has normal strength and normal reflexes. No cranial nerve deficit or sensory deficit. Gait normal.  Skin: Skin is warm and dry. No rash noted. No cyanosis. Nails show no clubbing.  Psychiatric: He has a normal mood and affect. His speech is normal and behavior is normal. Judgment and thought content normal. Cognition and memory are normal.     CMP ( most recent) CMP     Component Value Date/Time   NA 133*  05/21/2015 0624   K 3.4* 05/21/2015 0624   CL 99* 05/21/2015 0624   CO2 26 05/21/2015 0624   GLUCOSE 166* 05/21/2015 0624   BUN 7 05/21/2015 0624   CREATININE 0.87 05/21/2015 0624   CALCIUM 9.1 05/21/2015 0624   PROT 6.1* 05/19/2015 0536   ALBUMIN 3.6 05/19/2015 0536   AST 18 05/19/2015 0536   ALT 20 05/19/2015 0536   ALKPHOS 37* 05/19/2015 0536   BILITOT 1.4* 05/19/2015 0536   GFRNONAA >60 05/21/2015 0624   GFRAA >60 05/21/2015 0624   Assessment & Plan:   1. Type 2 diabetes mellitus with vascular disease ([place])   - Patient has currently uncontrolled symptomatic type 2 DM since  60 years of age. He does not have recent A1c however from August   it was greater than 14%. No recent labs to review. -He came with remarkable improvement in his blood glucose profile responding to the recent initiation of basal/bolus insulin therapy. It is reflected in appropriate weight gain of 6 pounds for him. -His diabetes is complicated by  CVA and noncompliance and patient remains at a high risk for more acute and chronic complications of diabetes which include CAD, CVA, CKD, retinopathy, and neuropathy. These are all discussed in detail with the patient.  - I have counseled the patient on diet management , by adopting a carbohydrate restricted/protein rich diet.  - Suggestion is made for patient to avoid simple carbohydrates   from their diet including Cakes , Desserts, Ice Cream,  Soda (  diet and regular) , Sweet Tea , Candies,  Chips, Cookies, Artificial Sweeteners,   and "Sugar-free" Products . This will help patient to have stable blood glucose profile and potentially avoid unintended weight gain.  - I encouraged the patient to switch to  unprocessed or minimally processed complex starch and increased protein intake (animal or plant source), fruits, and vegetables.  - Patient is advised to stick to a routine mealtimes to eat 3 meals  a day and avoid unnecessary snacks ( to snack only to correct  hypoglycemia).  - The patient will be scheduled with Jearld Fenton, RDN, CDE for individualized DM education.  - I have approached patient with the following individualized plan to manage diabetes and patient agrees:   - I  will proceed with basal insulin Tresiba 20 units QHS, and  lower prandial insulin  NovoLog to 5 units TIDAC for pre-meal BG readings of 90-150mg /dl, plus patient specific correction dose for unexpected hyperglycemia above 150mg /dl, associated with strict monitoring of glucose  AC and HS. - Patient is warned not to take insulin without proper monitoring per orders. -Adjustment parameters are given for hypo and hyperglycemia in writing. -Patient is encouraged to call clinic for blood glucose levels less than 70 or above 300 mg /dl. - Insulin is the only and safest therapy she should be getting at this point.  - Patient specific target  A1c;  LDL, HDL, Triglycerides, and  Waist Circumference were discussed in detail.  2) BP/HTN:  Controlled. Continue current medications including ACEI/ARB. 3) Lipids/HPL:  Lipid panel unknown, I would obtain fasting lipid profile along with his next blood work.Marland Kitchen  4)  Weight/Diet: CDE Consult will be initiated , exercise, and detailed carbohydrates information provided.  5) Chronic Care/Health Maintenance:  -Patient is on ACEI/ARB  medications and encouraged to continue to follow up with Ophthalmology, Podiatrist at least yearly or according to recommendations, and advised to  stay away from smoking. I have recommended yearly flu vaccine and pneumonia vaccination at least every 5 years; moderate intensity exercise for up to 150 minutes weekly; and  sleep for at least 7 hours a day.  -  25 minutes of time was spent on the care of this patient , 50% of which was applied for counseling on diabetes complications and their preventions.  - Patient to bring meter and  blood glucose logs during their next visit. -He will return in 2 weeks with repeat  lab work.  - I advised patient to maintain close follow up with Collene Mares, PA-C for primary care needs.   Glade Lloyd, MD Phone: [phone]  Fax: [phone]   07/15/2015, 4:37 PM  This encounter was created in error - please disregard. This encounter was created in error - please disregard.

## 2015-07-15 NOTE — Progress Notes (Signed)
Subjective:    Patient ID: Travis Sharp, male    DOB: [birth year]. Patient is Here to follow-up for management of uncontrolled type 2 diabetes.  Past Medical History  Diagnosis Date  . Hypertension   . Diabetes mellitus   . Colitis 03/23/2015   Past Surgical History  Procedure Laterality Date  . Tonsillectomy    . Colonoscopy with propofol N/A 05/10/2015    Procedure: COLONOSCOPY WITH PROPOFOL;  Surgeon: Danie Binder, MD;  Location: [facility];  Service: Endoscopy;  Laterality: N/A;  0730  . Esophagogastroduodenoscopy (egd) with propofol N/A 05/10/2015    Procedure: ESOPHAGOGASTRODUODENOSCOPY (EGD) WITH PROPOFOL;  Surgeon: Danie Binder, MD;  Location: [facility];  Service: Endoscopy;  Laterality: N/A;  . Savory dilation N/A 05/10/2015    Procedure: SAVORY DILATION;  Surgeon: Danie Binder, MD;  Location: [facility];  Service: Endoscopy;  Laterality: N/A;  . Biopsy  05/10/2015    Procedure: BIOPSY;  Surgeon: Danie Binder, MD;  Location: [facility];  Service: Endoscopy;;  gastric bx's   Social History   Social History  . Marital Status: Married    Spouse Name: N/A  . Number of Children: N/A  . Years of Education: N/A   Occupational History  . [employer]    Social History Main Topics  . Smoking status: Former Smoker -- 1.00 packs/day for 28 years    Quit date: 05/04/2005  . Smokeless tobacco: None  . Alcohol Use: No  . Drug Use: Yes    Special: Marijuana     Comment: marijuana - last used 1 month ago  . Sexual Activity: Not Asked   Other Topics Concern  . None   Social History Narrative   Outpatient Encounter Prescriptions as of 07/15/2015  Medication Sig  . BENICAR HCT 40-25 MG tablet Take 1 tablet by mouth daily. RESTART IN 2 DAYS. (Patient taking differently: Take 1 tablet by mouth daily. )  . insulin aspart (NOVOLOG FLEXPEN) 100 UNIT/ML FlexPen Inject 8-14 Units into the skin 3 (three) times daily with meals.  . Insulin Degludec (TRESIBA  FLEXTOUCH) 100 UNIT/ML SOPN Inject 20 Units into the skin at bedtime.  . ondansetron (ZOFRAN) 4 MG tablet Take 1 tablet (4 mg total) by mouth every 8 (eight) hours as needed for nausea or vomiting.  . pantoprazole (PROTONIX) 40 MG tablet Take 1 tablet (40 mg total) by mouth daily.  Marland Kitchen saccharomyces boulardii (FLORASTOR) 250 MG capsule Take 1 capsule (250 mg total) by mouth 2 (two) times daily.   No facility-administered encounter medications on file as of 07/15/2015.   ALLERGIES: No Known Allergies VACCINATION STATUS: Immunization History  Administered Date(s) Administered  . Influenza,inj,Quad PF,36+ Mos 03/24/2015  . Pneumococcal Polysaccharide-23 03/24/2015    Diabetes He presents for his initial diabetic visit. He has type 2 diabetes mellitus. Onset time: He was diagnosed at approximate age of 46 years. His disease course has been worsening (This patient was seen by myself more than 3 years ago, displayed a very serious noncompliance, the separate from care and now being re-referred for diabetes care. In the meantime he lost about 50 pounds .). There are no hypoglycemic associated symptoms. Pertinent negatives for hypoglycemia include no confusion, headaches, pallor or seizures. Associated symptoms include polydipsia, polyphagia, polyuria and visual change. Pertinent negatives for diabetes include no chest pain, no fatigue and no weakness. There are no hypoglycemic complications. Symptoms are worsening. Diabetic complications include a CVA. (Serious noncompliance.) Risk factors  for coronary artery disease include dyslipidemia, diabetes mellitus, hypertension, male sex and tobacco exposure. Current diabetic treatment includes insulin injections. His weight is decreasing steadily (The current weight loss his abnormal due to interval diagnosis of colitis accompanied by insulin deficiency.). He is following a generally unhealthy diet. When asked about meal planning, he reported none. He has not had a  previous visit with a dietitian. He rarely participates in exercise. Home blood sugar record trend: He did not bring any meter nor blood glucose readings to review with him. He admits that he is not monitoring blood glucose regularly. An ACE inhibitor/angiotensin II receptor blocker is being taken.  Hypertension This is a chronic problem. The current episode started more than 1 year ago. Pertinent negatives include no chest pain, headaches, neck pain, palpitations or shortness of breath. Risk factors for coronary artery disease include dyslipidemia, diabetes mellitus, male gender and smoking/tobacco exposure. Past treatments include angiotensin blockers. Hypertensive end-organ damage includes CVA.    Review of Systems  Constitutional: Positive for unexpected weight change. Negative for fever, chills and fatigue.  HENT: Negative for dental problem, mouth sores and trouble swallowing.   Eyes: Negative for visual disturbance.  Respiratory: Negative for cough, choking, chest tightness, shortness of breath and wheezing.   Cardiovascular: Negative for chest pain, palpitations and leg swelling.  Gastrointestinal: Negative for nausea, vomiting, abdominal pain, diarrhea, constipation and abdominal distention.  Endocrine: Positive for polydipsia, polyphagia and polyuria.  Genitourinary: Negative for dysuria, urgency, hematuria and flank pain.  Musculoskeletal: Negative for myalgias, back pain, gait problem and neck pain.  Skin: Negative for pallor, rash and wound.  Neurological: Negative for seizures, syncope, weakness, numbness and headaches.  Psychiatric/Behavioral: Negative.  Negative for confusion and dysphoric mood.    Objective:    BP 128/83 mmHg  Pulse 90  Ht 5\' 10"  (1.778 m)  Wt 168 lb (76.204 kg)  BMI 24.11 kg/m2  SpO2 98%  Wt Readings from Last 3 Encounters:  07/15/15 168 lb (76.204 kg)  07/04/15 162 lb (73.483 kg)  05/18/15 163 lb 1.6 oz (73.982 kg)    Physical Exam   Constitutional: He is oriented to person, place, and time. He appears well-developed and well-nourished. He is cooperative. No distress.  HENT:  Head: Normocephalic and atraumatic.  Eyes: EOM are normal.  Neck: Normal range of motion. Neck supple. No tracheal deviation present. No thyromegaly present.  Cardiovascular: Normal rate, S1 normal, S2 normal and normal heart sounds.  Exam reveals no gallop.   No murmur heard. Pulses:      Dorsalis pedis pulses are 1+ on the right side, and 1+ on the left side.       Posterior tibial pulses are 1+ on the right side, and 1+ on the left side.  Pulmonary/Chest: Breath sounds normal. No respiratory distress. He has no wheezes.  Abdominal: Soft. Bowel sounds are normal. He exhibits no distension. There is no tenderness. There is no guarding and no CVA tenderness.  Musculoskeletal: He exhibits no edema.       Right shoulder: He exhibits no swelling and no deformity.  Neurological: He is alert and oriented to person, place, and time. He has normal strength and normal reflexes. No cranial nerve deficit or sensory deficit. Gait normal.  Skin: Skin is warm and dry. No rash noted. No cyanosis. Nails show no clubbing.  Psychiatric: He has a normal mood and affect. His speech is normal and behavior is normal. Judgment and thought content normal. Cognition and memory are normal.  CMP     Component Value Date/Time   NA 133* 05/21/2015 0624   K 3.4* 05/21/2015 0624   CL 99* 05/21/2015 0624   CO2 26 05/21/2015 0624   GLUCOSE 166* 05/21/2015 0624   BUN 7 05/21/2015 0624   CREATININE 0.87 05/21/2015 0624   CALCIUM 9.1 05/21/2015 0624   PROT 6.1* 05/19/2015 0536   ALBUMIN 3.6 05/19/2015 0536   AST 18 05/19/2015 0536   ALT 20 05/19/2015 0536   ALKPHOS 37* 05/19/2015 0536   BILITOT 1.4* 05/19/2015 0536   GFRNONAA >60 05/21/2015 0624   GFRAA >60 05/21/2015 0624     Assessment & Plan:   1. Type 2 diabetes mellitus with vascular disease ([place])   -  Patient has currently uncontrolled symptomatic type 2 DM since  60 years of age. He does not have recent A1c however from August   it was greater than 14%. No recent labs to review. -He came with traumatic improvement in his blood glucose profile and gained 6 pounds since last visit, a good development for him. -His diabetes is complicated by  CVA and noncompliance and patient remains at a high risk for more acute and chronic complications of diabetes which include CAD, CVA, CKD, retinopathy, and neuropathy. These are all discussed in detail with the patient.  - I have counseled the patient on diet management , by adopting a carbohydrate restricted/protein rich diet.  - Suggestion is made for patient to avoid simple carbohydrates   from their diet including Cakes , Desserts, Ice Cream,  Soda (  diet and regular) , Sweet Tea , Candies,  Chips, Cookies, Artificial Sweeteners,   and "Sugar-free" Products . This will help patient to have stable blood glucose profile and potentially avoid unintended weight gain.  - I encouraged the patient to switch to  unprocessed or minimally processed complex starch and increased protein intake (animal or plant source), fruits, and vegetables.  - Patient is advised to stick to a routine mealtimes to eat 3 meals  a day and avoid unnecessary snacks ( to snack only to correct hypoglycemia).  - The patient will be scheduled with Jearld Fenton, RDN, CDE for individualized DM education.  - I have approached patient with the following individualized plan to manage diabetes and patient agrees:   - I  will proceed withTresiba 20 units QHS, and  Lower prandial insulin  NovoLog to 5 units TIDAC for pre-meal BG readings of 90-150mg /dl, plus patient specific correction dose for unexpected hyperglycemia above 150mg /dl, associated with strict monitoring of glucose  AC and HS. - Patient is warned not to take insulin without proper monitoring per orders. -Adjustment parameters are  given for hypo and hyperglycemia in writing. -Patient is encouraged to call clinic for blood glucose levels less than 70 or above 300 mg /dl. - Insulin is the only and safest therapy he should be getting at this point.  - Patient specific target  A1c;  LDL, HDL, Triglycerides, and  Waist Circumference were discussed in detail.  2) BP/HTN:  Controlled. Continue current medications including ACEI/ARB. 3) Lipids/HPL:  Lipid panel unknown, I would obtain fasting lipid profile along with his next blood work.Marland Kitchen  4)  Weight/Diet: CDE Consult will be initiated , exercise, and detailed carbohydrates information provided.  5) Chronic Care/Health Maintenance:  -Patient is on ACEI/ARB  medications and encouraged to continue to follow up with Ophthalmology, Podiatrist at least yearly or according to recommendations, and advised to  stay away from smoking. I have recommended  yearly flu vaccine and pneumonia vaccination at least every 5 years; moderate intensity exercise for up to 150 minutes weekly; and  sleep for at least 7 hours a day.  - 25 minutes of time was spent on the care of this patient , 50% of which was applied for counseling on diabetes complications and their preventions.  - Patient to bring meter and  blood glucose logs during their next visit.   - I advised patient to maintain close follow up with Collene Mares, PA-C for primary care needs.  Follow up plan: - Return in about 2 weeks (around 07/29/2015) for diabetes, high blood pressure.  Glade Lloyd, MD Phone: [phone]  Fax: [phone]   07/15/2015, 9:31 AM

## 2015-08-01 ENCOUNTER — Ambulatory Visit: Payer: BLUE CROSS/BLUE SHIELD | Admitting: "Endocrinology

## 2015-09-16 ENCOUNTER — Encounter: Payer: Self-pay | Admitting: Cardiovascular Disease

## 2015-09-23 ENCOUNTER — Encounter (HOSPITAL_COMMUNITY): Payer: Self-pay

## 2015-09-23 ENCOUNTER — Emergency Department (HOSPITAL_COMMUNITY)
Admission: EM | Admit: 2015-09-23 | Discharge: 2015-09-23 | Disposition: A | Discharge status: Home / Self Care | Payer: BLUE CROSS/BLUE SHIELD | Attending: Emergency Medicine | Admitting: Emergency Medicine

## 2015-09-23 DIAGNOSIS — Z87891 Personal history of nicotine dependence: Secondary | ICD-10-CM | POA: Insufficient documentation

## 2015-09-23 DIAGNOSIS — Z794 Long term (current) use of insulin: Secondary | ICD-10-CM | POA: Diagnosis not present

## 2015-09-23 DIAGNOSIS — Z79899 Other long term (current) drug therapy: Secondary | ICD-10-CM | POA: Insufficient documentation

## 2015-09-23 DIAGNOSIS — R112 Nausea with vomiting, unspecified: Secondary | ICD-10-CM | POA: Diagnosis present

## 2015-09-23 DIAGNOSIS — I10 Essential (primary) hypertension: Secondary | ICD-10-CM | POA: Insufficient documentation

## 2015-09-23 DIAGNOSIS — F12188 Cannabis abuse with other cannabis-induced disorder: Secondary | ICD-10-CM | POA: Diagnosis not present

## 2015-09-23 DIAGNOSIS — F1290 Cannabis use, unspecified, uncomplicated: Secondary | ICD-10-CM

## 2015-09-23 DIAGNOSIS — E119 Type 2 diabetes mellitus without complications: Secondary | ICD-10-CM | POA: Insufficient documentation

## 2015-09-23 DIAGNOSIS — F12988 Cannabis use, unspecified with other cannabis-induced disorder: Secondary | ICD-10-CM

## 2015-09-23 LAB — COMPREHENSIVE METABOLIC PANEL
ALT: 26 U/L (ref 17–63)
AST: 32 U/L (ref 15–41)
Albumin: 5.1 g/dL — ABNORMAL HIGH (ref 3.5–5.0)
Alkaline Phosphatase: 59 U/L (ref 38–126)
Anion gap: 10 (ref 5–15)
BUN: 15 mg/dL (ref 6–20)
CO2: 24 mmol/L (ref 22–32)
Calcium: 9.8 mg/dL (ref 8.9–10.3)
Chloride: 103 mmol/L (ref 101–111)
Creatinine, Ser: 0.92 mg/dL (ref 0.61–1.24)
GFR calc Af Amer: >60 mL/min (ref >60)
GFR calc non Af Amer: >60 mL/min (ref >60)
Glucose, Bld: 173 mg/dL — ABNORMAL HIGH (ref 65–99)
Potassium: 3.4 mmol/L — ABNORMAL LOW (ref 3.5–5.1)
Sodium: 137 mmol/L (ref 135–145)
Total Bilirubin: 1.9 mg/dL — ABNORMAL HIGH (ref 0.3–1.2)
Total Protein: 8.6 g/dL — ABNORMAL HIGH (ref 6.5–8.1)

## 2015-09-23 LAB — CBC WITH DIFFERENTIAL/PLATELET
Basophils Absolute: 0 10*3/uL (ref 0.0–0.1)
Basophils Relative: 0 %
Eosinophils Absolute: 0 10*3/uL (ref 0.0–0.7)
Eosinophils Relative: 0 %
HCT: 47.2 % (ref 39.0–52.0)
Hemoglobin: 16.7 g/dL (ref 13.0–17.0)
Lymphocytes Relative: 21 %
Lymphs Abs: 1.6 10*3/uL (ref 0.7–4.0)
MCH: 30.7 pg (ref 26.0–34.0)
MCHC: 35.4 g/dL (ref 30.0–36.0)
MCV: 86.8 fL (ref 78.0–100.0)
Monocytes Absolute: 0.5 10*3/uL (ref 0.1–1.0)
Monocytes Relative: 7 %
Neutro Abs: 5.5 10*3/uL (ref 1.7–7.7)
Neutrophils Relative %: 72 %
Platelets: 186 10*3/uL (ref 150–400)
RBC: 5.44 MIL/uL (ref 4.22–5.81)
RDW: 13.1 % (ref 11.5–15.5)
WBC: 7.7 10*3/uL (ref 4.0–10.5)

## 2015-09-23 LAB — RAPID URINE DRUG SCREEN, HOSP PERFORMED
Amphetamines: NOT DETECTED
Barbiturates: NOT DETECTED
Benzodiazepines: NOT DETECTED
Cocaine: NOT DETECTED
Opiates: NOT DETECTED
Tetrahydrocannabinol: POSITIVE — AB

## 2015-09-23 LAB — URINALYSIS, ROUTINE W REFLEX MICROSCOPIC
Bilirubin Urine: NEGATIVE
Glucose, UA: NEGATIVE mg/dL
Hgb urine dipstick: NEGATIVE
Ketones, ur: 40 mg/dL — AB
Leukocytes, UA: NEGATIVE
Nitrite: NEGATIVE
Protein, ur: NEGATIVE mg/dL
Specific Gravity, Urine: 1.015 (ref 1.005–1.030)
pH: 7 (ref 5.0–8.0)

## 2015-09-23 LAB — CBG MONITORING, ED: Glucose-Capillary: 163 mg/dL — ABNORMAL HIGH (ref 65–99)

## 2015-09-23 LAB — LIPASE, BLOOD: Lipase: 17 U/L (ref 11–51)

## 2015-09-23 MED ORDER — PROMETHAZINE HCL 25 MG RE SUPP: 25.0000 mg | Freq: Four times a day (QID) | RECTAL | Status: DC | PRN

## 2015-09-23 MED ORDER — ONDANSETRON HCL 4 MG/2ML IJ SOLN
4.0000 mg | Freq: Once | INTRAMUSCULAR | Status: AC
  Administered 2015-09-23: 4 mg via INTRAVENOUS
  Filled 2015-09-23: qty 2

## 2015-09-23 MED ORDER — SODIUM CHLORIDE 0.9 % IV BOLUS (SEPSIS)
1000.0000 mL | Freq: Once | INTRAVENOUS | Status: AC
  Administered 2015-09-23: 1000 mL via INTRAVENOUS

## 2015-09-23 MED ORDER — ONDANSETRON 4 MG PO TBDP
4.0000 mg | ORAL_TABLET | Freq: Three times a day (TID) | ORAL | Status: DC | PRN
Start: 2015-09-23 — End: 2015-09-29

## 2015-09-23 MED ORDER — SODIUM CHLORIDE 0.9 % IV SOLN: INTRAVENOUS | Status: DC

## 2015-09-23 MED ORDER — HALOPERIDOL LACTATE 5 MG/ML IJ SOLN
2.0000 mg | Freq: Once | INTRAMUSCULAR | Status: AC
Start: 2015-09-23 — End: 2015-09-23
  Administered 2015-09-23: 2 mg via INTRAVENOUS
  Filled 2015-09-23: qty 1

## 2015-09-23 NOTE — ED Notes (Signed)
MD  And wife at the bedside

## 2015-09-23 NOTE — ED Notes (Signed)
Pt with history of colitis, started having pain with nausea and vomiting last evening

## 2015-09-23 NOTE — ED Notes (Signed)
Patient given discharge instruction, verbalized understand. IV removed, band aid applied. Patient ambulatory out of the department.  

## 2015-09-23 NOTE — ED Provider Notes (Signed)
TIME SEEN: 1:30 AM  CHIEF COMPLAINT: Nausea and vomiting  HPI: Patient is a 60 y.o. male with history of hypertension, diabetes, colitis who presents to the emergency department with nausea and vomiting that started yesterday morning. Patient has had similar symptoms twice in the past and has had CT scans that showed colitis. Thought to be infectious in nature. He denies having any abdominal pain today, diarrhea, bloody stools or melena. No fevers. No chest pain or shortness of breath. Also some concern for possible hyperemesis related to marijuana use. He does endorse using marijuana over the past several days. No history of abdominal surgeries. No sick contacts or recent travel. Wife reports that Haldol has helped him significantly in the past. He has tried Protonix and Zofran at home without relief.  ROS: See HPI Constitutional: no fever  Eyes: no drainage  ENT: no runny nose   Cardiovascular:  no chest pain  Resp: no SOB  GI:  vomiting GU: no dysuria Integumentary: no rash  Allergy: no hives  Musculoskeletal: no leg swelling  Neurological: no slurred speech ROS otherwise negative  PAST MEDICAL HISTORY/PAST SURGICAL HISTORY:  Past Medical History  Diagnosis Date  . Hypertension   . Diabetes mellitus   . Colitis 03/23/2015    MEDICATIONS:  Prior to Admission medications   Medication Sig Start Date End Date Taking? Authorizing Provider  BENICAR HCT 40-25 MG tablet Take 1 tablet by mouth daily. RESTART IN 2 DAYS. Patient taking differently: Take 1 tablet by mouth daily.  03/27/15  Yes Rexene Alberts, MD  famotidine (PEPCID) 20 MG tablet Take 20 mg by mouth 2 (two) times daily.   Yes Historical Provider, MD  insulin aspart (NOVOLOG FLEXPEN) 100 UNIT/ML FlexPen Inject 8-14 Units into the skin 3 (three) times daily with meals. 07/04/15  Yes Cassandria Anger, MD  ondansetron (ZOFRAN) 4 MG tablet Take 1 tablet (4 mg total) by mouth every 8 (eight) hours as needed for nausea or  vomiting. 05/21/15  Yes Donne Hazel, MD  pantoprazole (PROTONIX) 40 MG tablet Take 1 tablet (40 mg total) by mouth daily. 04/26/15  Yes Orvil Feil, NP  saccharomyces boulardii (FLORASTOR) 250 MG capsule Take 1 capsule (250 mg total) by mouth 2 (two) times daily. 05/21/15  Yes Donne Hazel, MD  Insulin Degludec (TRESIBA FLEXTOUCH) 100 UNIT/ML SOPN Inject 20 Units into the skin at bedtime.    Historical Provider, MD    ALLERGIES:  No Known Allergies  SOCIAL HISTORY:  Social History  Substance Use Topics  . Smoking status: Former Smoker -- 1.00 packs/day for 28 years    Quit date: 05/04/2005  . Smokeless tobacco: Not on file  . Alcohol Use: No    FAMILY HISTORY: Family History  Problem Relation Age of Onset  . Colon cancer      possibly 2 uncles  . Stroke Mother   . Aneurysm Mother   . Kidney failure Brother   . Heart disease Father     EXAM: BP 167/99 mmHg  Pulse 85  Temp(Src) 97.9 F (36.6 C) (Oral)  Resp 22  Ht 5\' 8"  (1.727 m)  Wt 162 lb (73.483 kg)  BMI 24.64 kg/m2  SpO2 100% CONSTITUTIONAL: Alert and oriented and responds appropriately to questions. Appears uncomfortable but is afebrile and nontoxic appearing, actively dry heaving HEAD: Normocephalic EYES: Conjunctivae clear, PERRL ENT: normal nose; no rhinorrhea; moist mucous membranes NECK: Supple, no meningismus, no LAD  CARD: RRR; S1 and S2 appreciated; no murmurs, no  clicks, no rubs, no gallops RESP: Normal chest excursion without splinting or tachypnea; breath sounds clear and equal bilaterally; no wheezes, no rhonchi, no rales, no hypoxia or respiratory distress, speaking full sentences ABD/GI: Normal bowel sounds; non-distended; soft, non-tender, no rebound, no guarding, no peritoneal signs BACK:  The back appears normal and is non-tender to palpation, there is no CVA tenderness EXT: Normal ROM in all joints; non-tender to palpation; no edema; normal capillary refill; no cyanosis, no calf tenderness or  swelling    SKIN: Normal color for age and race; warm; no rash NEURO: Moves all extremities equally, sensation to light touch intact diffusely, cranial nerves II through XII intact   MEDICAL DECISION MAKING: Patient here with vomiting. Abdominal exam is completely benign. Blood glucose is normal. Discussed with patient and wife that I think that this may be related to his recent marijuana use. Given benign abdominal exam without diarrhea, Silva Bandy is less likely infectious colitis. Given IV Zofran and fluids without relief. We'll give IV Haldol and reassess. Labs, urine pending.  ED PROGRESS: 3:25 AM  Patient's labs unremarkable. No leukocytosis. Urinalysis, urine drug screen pending. He is now sleeping comfortably without further vomiting after IV Haldol.  We'll obtain urine sample and fluid challenge patient when he is more awake.    4:05 AM  Pt reports she is still feeling much better. No further vomiting. Drinking without difficulty. Urine shows no sign of infection. Drug screen is positive for THC. I suspect that this is hyperemesis secondary to marijuana use. Again abdominal exam is benign. I feel he is safe to be discharged. Wife and friend at bedside are comfortable with this plan as well as patient. He has a PCP and gastro-neurologist for follow-up. We'll refill his Zofran ODT and also give prescription for Phenergan suppositories. Have had lengthy discussion with patient and family about cessation of marijuana use. They verbalize understanding and are comfortable with this plan.   At this time, I do not feel there is any life-threatening condition present. I have reviewed and discussed all results (EKG, imaging, lab, urine as appropriate), exam findings with patient. I have reviewed nursing notes and appropriate previous records.  I feel the patient is safe to be discharged home without further emergent workup. Discussed usual and customary return precautions. Patient and family (if present)  verbalize understanding and are comfortable with this plan.  Patient will follow-up with their primary care provider. If they do not have a primary care provider, information for follow-up has been provided to them. All questions have been answered.    EKG Interpretation  Date/Time:  Friday September 23 2015 02:00:02 EDT Ventricular Rate:  78 PR Interval:  151 QRS Duration: 100 QT Interval:  426 QTC Calculation: 485 R Axis:   89 Text Interpretation:  Sinus rhythm Borderline T wave abnormalities Borderline prolonged QT interval No significant change since last tracing Confirmed by Nello Corro,  DO, Mazella Deen [phone]) on 09/23/2015 2:51:04 AM        [place], DO 09/23/15 [id number]

## 2015-09-23 NOTE — Discharge Instructions (Signed)
Cannabis Use Disorder °Cannabis use disorder is a mental disorder. It is not one-time or occasional use of cannabis, more commonly known as marijuana. Cannabis use disorder is the continued, nonmedical use of cannabis that interferes with normal life activities or causes health problems. People with cannabis use disorder get a feeling of extreme pleasure and relaxation from cannabis use. This "high" is very rewarding and causes people to use over and over.  °The mind-altering ingredient in cannabis is know as THC. THC can also interfere with motor coordination, memory, judgment, and accurate sense of space and time. These effects can last for a few days after using cannabis. Regular heavy cannabis use can cause long-lasting problems with thinking and learning. In young people, these problems may be permanent. Cannabis sometimes causes severe anxiety, paranoia, or visual hallucinations. Man-made (synthetic) cannabis-like drugs, such as "spice" and "K2," cause the same effects as THC but are much stronger. Cannabis-like drugs can cause dangerously high blood pressure and heart rate.  °Cannabis use disorder usually starts in the teenage years. It can trigger the development of schizophrenia. It is somewhat more common in men than women. People who have family members with the disorder or existing mental health issues such as depression and posttraumatic stress disorder are more likely to develop cannabis use disorder. People with cannabis use disorder are at higher risk for use of other drugs of abuse.  °SIGNS AND SYMPTOMS °Signs and symptoms of cannabis use disorder include:  °· Use of cannabis in larger amounts or over a longer period than intended.   °· Unsuccessful attempts to cut down or control cannabis use.   °· A lot of time spent obtaining, using, or recovering from the effects of cannabis.   °· A strong desire or urge to use cannabis (cravings).   °· Continued use of cannabis in spite of problems at work,  school, or home because of use.   °· Continued use of cannabis in spite of relationship problems because of use. °· Giving up or cutting down on important life activities because of cannabis use. °· Use of cannabis over and over even in situations when it is physically hazardous, such as when driving a car.   °· Continued use of cannabis in spite of a physical problem that is likely related to use. Physical problems can include: °· Chronic cough. °· Bronchitis. °· Emphysema. °· Throat and lung cancer. °· Continued use of cannabis in spite of a mental problem that is likely related to use. Mental problems can include: °· Psychosis. °· Anxiety. °· Difficulty sleeping. °· Need to use more and more cannabis to get the same effect, or lessened effect over time with use of the same amount (tolerance). °· Having withdrawal symptoms when cannabis use is stopped, or using cannabis to reduce or avoid withdrawal symptoms. Withdrawal symptoms include: °· Irritability or anger. °· Anxiety or restlessness. °· Difficulty sleeping. °· Loss of appetite or weight. °· Aches and pains. °· Shakiness. °· Sweating. °· Chills. °DIAGNOSIS °Cannabis use disorder is diagnosed by your health care provider. You may be asked questions about your cannabis use and how it affects your life. A physical exam may be done. A drug screen may be done. You may be referred to a mental health professional. The diagnosis of cannabis use disorder requires at least two symptoms within 12 months. The type of cannabis use disorder you have depends on the number of symptoms you have. The type may be: °· Mild. Two or three signs and symptoms.   °· Moderate. Four or   five signs and symptoms.   °· Severe. Six or more signs and symptoms.   °TREATMENT °Treatment is usually provided by mental health professionals with training in substance use disorders. The following options are available: °· Counseling or talk therapy. Talk therapy addresses the reasons you use  cannabis. It also addresses ways to keep you from using again. The goals of talk therapy include: °¨ Identifying and avoiding triggers for use. °¨ Learning how to handle cravings. °¨ Replacing use with healthy activities. °· Support groups. Support groups provide emotional support, advice, and guidance. °· Medicine. Medicine is used to treat mental health issues that trigger cannabis use or that result from it. °HOME CARE INSTRUCTIONS °· Take medicines only as directed by your health care provider. °· Check with your health care provider before starting any new medicines. °· Keep all follow-up visits as directed by your health care provider. °SEEK MEDICAL CARE IF: °· You are not able to take your medicines as directed. °· Your symptoms get worse. °SEEK IMMEDIATE MEDICAL CARE IF: °You have serious thoughts about hurting yourself or others. °FOR MORE INFORMATION °· National Institute on Drug Abuse: www.drugabuse.gov °· Substance Abuse and Mental Health Services Administration: www.samhsa.gov °  °This information is not intended to replace advice given to you by your health care provider. Make sure you discuss any questions you have with your health care provider. °  °Document Released: 03/30/2000 Document Revised: 04/23/2014 Document Reviewed: 04/15/2013 °Elsevier Interactive Patient Education ©2016 [employer] °Nausea and Vomiting °Nausea is a sick feeling that often comes before throwing up (vomiting). Vomiting is a reflex where stomach contents come out of your mouth. Vomiting can cause severe loss of body fluids (dehydration). Children and elderly adults can become dehydrated quickly, especially if they also have diarrhea. Nausea and vomiting are symptoms of a condition or disease. It is important to find the cause of your symptoms. °CAUSES  °· Direct irritation of the stomach lining. This irritation can result from increased acid production (gastroesophageal reflux disease), infection, food poisoning, taking  certain medicines (such as nonsteroidal anti-inflammatory drugs), alcohol use, or tobacco use. °· Signals from the brain. These signals could be caused by a headache, heat exposure, an inner ear disturbance, increased pressure in the brain from injury, infection, a tumor, or a concussion, pain, emotional stimulus, or metabolic problems. °· An obstruction in the gastrointestinal tract (bowel obstruction). °· Illnesses such as diabetes, hepatitis, gallbladder problems, appendicitis, kidney problems, cancer, sepsis, atypical symptoms of a heart attack, or eating disorders. °· Medical treatments such as chemotherapy and radiation. °· Receiving medicine that makes you sleep (general anesthetic) during surgery. °DIAGNOSIS °Your caregiver may ask for tests to be done if the problems do not improve after a few days. Tests may also be done if symptoms are severe or if the reason for the nausea and vomiting is not clear. Tests may include: °· Urine tests. °· Blood tests. °· Stool tests. °· Cultures (to look for evidence of infection). °· X-rays or other imaging studies. °Test results can help your caregiver make decisions about treatment or the need for additional tests. °TREATMENT °You need to stay well hydrated. Drink frequently but in small amounts. You may wish to drink water, sports drinks, clear broth, or eat frozen ice pops or gelatin dessert to help stay hydrated. When you eat, eating slowly may help prevent nausea. There are also some antinausea medicines that may help prevent nausea. °HOME CARE INSTRUCTIONS  °· Take all medicine as directed by your caregiver. °· If you do not   have an appetite, do not force yourself to eat. However, you must continue to drink fluids. °· If you have an appetite, eat a normal diet unless your caregiver tells you differently. °¨ Eat a variety of complex carbohydrates (rice, wheat, potatoes, bread), lean meats, yogurt, fruits, and vegetables. °¨ Avoid high-fat foods because they are more  difficult to digest. °· Drink enough water and fluids to keep your urine clear or pale yellow. °· If you are dehydrated, ask your caregiver for specific rehydration instructions. Signs of dehydration may include: °¨ Severe thirst. °¨ Dry lips and mouth. °¨ Dizziness. °¨ Dark urine. °¨ Decreasing urine frequency and amount. °¨ Confusion. °¨ Rapid breathing or pulse. °SEEK IMMEDIATE MEDICAL CARE IF:  °· You have blood or brown flecks (like coffee grounds) in your vomit. °· You have black or bloody stools. °· You have a severe headache or stiff neck. °· You are confused. °· You have severe abdominal pain. °· You have chest pain or trouble breathing. °· You do not urinate at least once every 8 hours. °· You develop cold or clammy skin. °· You continue to vomit for longer than 24 to 48 hours. °· You have a fever. °MAKE SURE YOU:  °· Understand these instructions. °· Will watch your condition. °· Will get help right away if you are not doing well or get worse. °  °This information is not intended to replace advice given to you by your health care provider. Make sure you discuss any questions you have with your health care provider. °  °Document Released: 04/02/2005 Document Revised: 06/25/2011 Document Reviewed: 08/30/2010 °Elsevier Interactive Patient Education ©2016 Else[employer] Inc. ° °

## 2015-09-23 NOTE — ED Notes (Signed)
Patient given Sprite at this time. Patient resting. Family at bedside.

## 2015-09-28 ENCOUNTER — Ambulatory Visit: Payer: BLUE CROSS/BLUE SHIELD | Admitting: Gastroenterology

## 2015-09-29 ENCOUNTER — Ambulatory Visit (INDEPENDENT_AMBULATORY_CARE_PROVIDER_SITE_OTHER): Payer: BLUE CROSS/BLUE SHIELD | Admitting: Nurse Practitioner

## 2015-09-29 ENCOUNTER — Encounter: Payer: Self-pay | Admitting: Nurse Practitioner

## 2015-09-29 VITALS — BP 134/85 | HR 78 | Temp 98.1°F | Ht 70.0 in | Wt 160.2 lb

## 2015-09-29 DIAGNOSIS — R112 Nausea with vomiting, unspecified: Secondary | ICD-10-CM | POA: Diagnosis not present

## 2015-09-29 DIAGNOSIS — K219 Gastro-esophageal reflux disease without esophagitis: Secondary | ICD-10-CM

## 2015-09-29 DIAGNOSIS — K5900 Constipation, unspecified: Secondary | ICD-10-CM | POA: Diagnosis not present

## 2015-09-29 NOTE — Patient Instructions (Signed)
1. Avoid using marijuana. 2. Start taking Linzess 145 g once a day on an empty stomach. 3. Stop taking Protonix. 4. Start taking Dexilant for acid reflux. Take 1 pill, once a day 30 minutes before eating breakfast. 5. Continue to use Zofran as needed for nausea. 6. Return for follow-up in 3 months.

## 2015-09-29 NOTE — Progress Notes (Addendum)
REVIEWED-NO ADDITIONAL RECOMMENDATIONS.  Referring Provider: Ginger Organ Primary Care Physician:  Collene Mares, PA-C Primary GI:  Dr. Oneida Alar  Chief Complaint  Patient presents with  . Follow-up    HPI:   Travis Sharp is a 60 y.o. male who presents for postprocedure follow-up. He was last seen in our office 04/26/2015 for colitis, GERD, dysphagia. At that time he noted he never had a colonoscopy or endoscopy. He was complaining of sharp rectal pain which was sometimes worse with bowel movements without bleeding. Noted constipation. Nausea and vomiting after eating and solid food dysphagia. That time he was not on a PPI. He had recently been admitted prior to this visit with colitis. He was set up with a colonoscopy and endoscopy, Protonix daily, Zofran as needed.  Colonoscopy and EGD completed 05/10/2015. Colonoscopy found that moderate thickening of the colon wall, specifically in the sigmoid colon, was most likely due to muscular hypertrophy/diverticulosis, moderate size internal hemorrhoids. EGD found. Dysphagia due to stricture at the GE junction which was dilated, mild nonerosive gastritis. Recommended adequate water intake, high-fiber/low-fat diet, Protonix daily, repeat colonoscopy in 10 years.  Is readmitted to the hospital in February 2017 with intractable nausea and vomiting and query possible colitis due to CT imaging that showed the same thickening as noted on the previous imaging exam and deemed likely due to muscular hypertrophy/diverticulosis on colonoscopy. Urine drug screen was positive for marijuana and query a component of cannabinoid hyperemesis syndrome. He was started on a course of Cipro and Flagyl which was continued on discharge.  He again presented to the emergency department on 09/23/2015 again with nausea and vomiting but without abdominal pain, diarrhea, bloody stools, fevers. The abdominal exam proved to be completely benign, labs normal, no  leukocytosis, improved with IV Haldol and fluids. Deemed likely cannabinoid hyperemesis syndrome.  Today he states his swallowing is much improved. Still has some issues with roast beef, which he is trying to avoid. Has to drink a lot of water with pills to allow them to pass. GERD symptoms worse with certain foods such as bacon, spicy foods. Cannot pinpoint if he has symptoms when not eating those foods. Frequent belching. Still on Protonix once a day. Also has intermittent left-sided pain which he associates with constipation. Has a bowel movement every 2-3 days. Has used Linzess for constipation. States Zofran dissolving tablet makes him feel more sick on his stomach. States when he starts eating loses his taste for food halfway through. Denies any other upper or lower GI symptoms.   Past Medical History  Diagnosis Date  . Hypertension   . Diabetes mellitus   . Colitis 03/23/2015    Past Surgical History  Procedure Laterality Date  . Tonsillectomy    . Colonoscopy with propofol N/A 05/10/2015    Procedure: COLONOSCOPY WITH PROPOFOL;  Surgeon: Danie Binder, MD;  Location: [facility];  Service: Endoscopy;  Laterality: N/A;  0730  . Esophagogastroduodenoscopy (egd) with propofol N/A 05/10/2015    Procedure: ESOPHAGOGASTRODUODENOSCOPY (EGD) WITH PROPOFOL;  Surgeon: Danie Binder, MD;  Location: [facility];  Service: Endoscopy;  Laterality: N/A;  . Savory dilation N/A 05/10/2015    Procedure: SAVORY DILATION;  Surgeon: Danie Binder, MD;  Location: [facility];  Service: Endoscopy;  Laterality: N/A;  . Biopsy  05/10/2015    Procedure: BIOPSY;  Surgeon: Danie Binder, MD;  Location: [facility];  Service: Endoscopy;;  gastric bx's    Current Outpatient Prescriptions  Medication Sig Dispense Refill  . BENICAR HCT 40-25 MG tablet Take 1 tablet by mouth daily. RESTART IN 2 DAYS. (Patient taking differently: Take 1 tablet by mouth daily. )    . famotidine (PEPCID) 20 MG tablet Take 20  mg by mouth 2 (two) times daily.    . insulin aspart (NOVOLOG FLEXPEN) 100 UNIT/ML FlexPen Inject 8-14 Units into the skin 3 (three) times daily with meals. 15 mL 2  . Insulin Degludec (TRESIBA FLEXTOUCH) 100 UNIT/ML SOPN Inject 20 Units into the skin at bedtime.    . ondansetron (ZOFRAN) 4 MG tablet Take 4 mg by mouth every 8 (eight) hours as needed for nausea or vomiting.    . pantoprazole (PROTONIX) 40 MG tablet Take 1 tablet (40 mg total) by mouth daily. 90 tablet 3  . promethazine (PHENERGAN) 25 MG suppository Place 1 suppository (25 mg total) rectally every 6 (six) hours as needed for nausea or vomiting. 20 each 0  . saccharomyces boulardii (FLORASTOR) 250 MG capsule Take 1 capsule (250 mg total) by mouth 2 (two) times daily. 30 capsule 0   No current facility-administered medications for this visit.    Allergies as of 09/29/2015  . (No Known Allergies)    Family History  Problem Relation Age of Onset  . Colon cancer      possibly 2 uncles  . Stroke Mother   . Aneurysm Mother   . Kidney failure Brother   . Heart disease Father     Social History   Social History  . Marital Status: Married    Spouse Name: N/A  . Number of Children: N/A  . Years of Education: N/A   Occupational History  . [employer]    Social History Main Topics  . Smoking status: Former Smoker -- 1.00 packs/day for 28 years    Quit date: 05/04/2005  . Smokeless tobacco: Never Used  . Alcohol Use: No  . Drug Use: Yes    Special: Marijuana     Comment: marijuana - last used abotu 1 week ago  . Sexual Activity: Not Asked   Other Topics Concern  . None   Social History Narrative    Review of Systems: 10-point ROS negative except as per HPI.   Physical Exam: BP 134/85 mmHg  Pulse 78  Temp(Src) 98.1 F (36.7 C) (Oral)  Ht 5\' 10"  (1.778 m)  Wt 160 lb 3.2 oz (72.666 kg)  BMI 22.99 kg/m2 General:   Alert and oriented. Pleasant and cooperative. Well-nourished and well-developed.  Head:   Normocephalic and atraumatic. Eyes:  Without icterus, sclera clear and conjunctiva pink.  Ears:  Normal auditory acuity. Cardiovascular:  S1, S2 present without murmurs appreciated. Extremities without clubbing or edema. Respiratory:  Clear to auscultation bilaterally. No wheezes, rales, or rhonchi. No distress.  Gastrointestinal:  +BS, soft, non-tender and non-distended. No HSM noted. No guarding or rebound. No masses appreciated.  Rectal:  Deferred  Musculoskalatal:  Symmetrical without gross deformities. Skin:  Intact without significant lesions or rashes. Neurologic:  Alert and oriented x4;  grossly normal neurologically. Psych:  Alert and cooperative. Normal mood and affect. Heme/Lymph/Immune: No excessive bruising noted.    09/29/2015 8:47 AM   Disclaimer: This note was dictated with voice recognition software. Similar sounding words can inadvertently be transcribed and may not be corrected upon review.

## 2015-10-03 DIAGNOSIS — K5900 Constipation, unspecified: Secondary | ICD-10-CM | POA: Insufficient documentation

## 2015-10-03 DIAGNOSIS — R112 Nausea with vomiting, unspecified: Secondary | ICD-10-CM | POA: Insufficient documentation

## 2015-10-03 NOTE — Assessment & Plan Note (Signed)
Patient with continued GERD symptoms, worse after certain foods. Recommend he avoid triggers. I will have him stop Protonix and start Dexon 60 mg once a day. Return for follow-up in 3 months.

## 2015-10-03 NOTE — Progress Notes (Signed)
cc'ed to pcp °

## 2015-10-03 NOTE — Assessment & Plan Note (Signed)
Patient had significant constipation with a bowel movement every 2-3 days associated with left-sided pain which improves after bowel movement. This point I'll start him on Linzess 145 g. Return for follow-up in 3 months.

## 2015-10-03 NOTE — Assessment & Plan Note (Signed)
After reviewing his records he seems to have worsening nausea and vomiting after smoking marijuana. He is been seen multiple times for this and it is likely cannabinoid hyperemesis syndrome. Recommend he stop smoking marijuana as this is likely causing his problems rather than alleviating it. I had a lengthy discussion with the patient and his wife related to this and he stated understanding. Continue Zofran 4 mg as needed for nausea. Return for follow-up in 3 months.

## 2015-10-11 ENCOUNTER — Encounter: Payer: Self-pay | Admitting: Cardiology

## 2015-10-11 ENCOUNTER — Ambulatory Visit (INDEPENDENT_AMBULATORY_CARE_PROVIDER_SITE_OTHER): Payer: BLUE CROSS/BLUE SHIELD | Admitting: Cardiology

## 2015-10-11 VITALS — BP 132/86 | HR 78 | Ht 70.0 in | Wt 167.0 lb

## 2015-10-11 DIAGNOSIS — R600 Localized edema: Secondary | ICD-10-CM | POA: Diagnosis not present

## 2015-10-11 DIAGNOSIS — R0602 Shortness of breath: Secondary | ICD-10-CM | POA: Diagnosis not present

## 2015-10-11 DIAGNOSIS — R0789 Other chest pain: Secondary | ICD-10-CM | POA: Diagnosis not present

## 2015-10-11 NOTE — Progress Notes (Signed)
Clinical Summary Mr. Stumpo is a 60 y.o.male seen today as a new patient, he is referred by PA Collene Mares for shortness of breath.   1. SOB - started 3-4 months ago. Noted walking up steps at work that he used to tolerate pretty well - can get SOB around fumes at work as well. Can be laughing and feel SOB. Former tobacco x 20 years - occasional LE edema x 2- 3 months. Occasional orthopnea - occasional chest pains he attributes to heart burn. Sharp pain left or right chest, mild in severity. Can occur at rest or with exertion. No other associated symptoms. Not positional. Lasts just a few seconds. Occurs 1-2 times a week. Ongoing x 2-3 months.   CAD risk: former tobacco, HTN, DM2, father MI 68, paternal aunt MI 78-50s.  Past Medical History  Diagnosis Date  . Hypertension   . Diabetes mellitus   . Colitis 03/23/2015     No Known Allergies   Current Outpatient Prescriptions  Medication Sig Dispense Refill  . BENICAR HCT 40-25 MG tablet Take 1 tablet by mouth daily. RESTART IN 2 DAYS. (Patient taking differently: Take 1 tablet by mouth daily. )    . famotidine (PEPCID) 20 MG tablet Take 20 mg by mouth 2 (two) times daily.    . insulin aspart (NOVOLOG FLEXPEN) 100 UNIT/ML FlexPen Inject 8-14 Units into the skin 3 (three) times daily with meals. 15 mL 2  . Insulin Degludec (TRESIBA FLEXTOUCH) 100 UNIT/ML SOPN Inject 20 Units into the skin at bedtime.    . ondansetron (ZOFRAN) 4 MG tablet Take 4 mg by mouth every 8 (eight) hours as needed for nausea or vomiting.    . pantoprazole (PROTONIX) 40 MG tablet Take 1 tablet (40 mg total) by mouth daily. 90 tablet 3  . promethazine (PHENERGAN) 25 MG suppository Place 1 suppository (25 mg total) rectally every 6 (six) hours as needed for nausea or vomiting. 20 each 0  . saccharomyces boulardii (FLORASTOR) 250 MG capsule Take 1 capsule (250 mg total) by mouth 2 (two) times daily. 30 capsule 0   No current facility-administered medications  for this visit.     Past Surgical History  Procedure Laterality Date  . Tonsillectomy    . Colonoscopy with propofol N/A 05/10/2015    Procedure: COLONOSCOPY WITH PROPOFOL;  Surgeon: Danie Binder, MD;  Location: [facility];  Service: Endoscopy;  Laterality: N/A;  0730  . Esophagogastroduodenoscopy (egd) with propofol N/A 05/10/2015    Procedure: ESOPHAGOGASTRODUODENOSCOPY (EGD) WITH PROPOFOL;  Surgeon: Danie Binder, MD;  Location: [facility];  Service: Endoscopy;  Laterality: N/A;  . Savory dilation N/A 05/10/2015    Procedure: SAVORY DILATION;  Surgeon: Danie Binder, MD;  Location: [facility];  Service: Endoscopy;  Laterality: N/A;  . Biopsy  05/10/2015    Procedure: BIOPSY;  Surgeon: Danie Binder, MD;  Location: [facility];  Service: Endoscopy;;  gastric bx's     No Known Allergies    Family History  Problem Relation Age of Onset  . Colon cancer      possibly 2 uncles  . Stroke Mother   . Aneurysm Mother   . Kidney failure Brother   . Heart disease Father      Social History Mr. Champion reports that he quit smoking about 10 years ago. He has never used smokeless tobacco. Mr. Maqueda reports that he does not drink alcohol.   Review of Systems CONSTITUTIONAL: No  weight loss, fever, chills, weakness or fatigue.  HEENT: Eyes: No visual loss, blurred vision, double vision or yellow sclerae.No hearing loss, sneezing, congestion, runny nose or sore throat.  SKIN: No rash or itching.  CARDIOVASCULAR: per HPI RESPIRATORY: per HPI GASTROINTESTINAL: No anorexia, nausea, vomiting or diarrhea. No abdominal pain or blood.  GENITOURINARY: No burning on urination, no polyuria NEUROLOGICAL: No headache, dizziness, syncope, paralysis, ataxia, numbness or tingling in the extremities. No change in bowel or bladder control.  MUSCULOSKELETAL: No muscle, back pain, joint pain or stiffness.  LYMPHATICS: No enlarged nodes. No history of splenectomy.  PSYCHIATRIC: No history of  depression or anxiety.  ENDOCRINOLOGIC: No reports of sweating, cold or heat intolerance. No polyuria or polydipsia.  Marland Kitchen   Physical Examination Filed Vitals:   10/11/15 0927  BP: 132/86  Pulse: 78   Filed Vitals:   10/11/15 0927  Height: 5\' 10"  (1.778 m)  Weight: 167 lb (75.751 kg)    Gen: resting comfortably, no acute distress HEENT: no scleral icterus, pupils equal round and reactive, no palptable cervical adenopathy,  CV: RRR, no m/r/g no jvd Resp: Clear to auscultation bilaterally GI: abdomen is soft, non-tender, non-distended, normal bowel sounds, no hepatosplenomegaly MSK: extremities are warm, no edema.  Skin: warm, no rash Neuro:  no focal deficits Psych: appropriate affect     Assessment and Plan  1. SOB/Chest pain/Leg swelling - recent symptoms of SOB, LE edema, and chest pain - baseline EKG SR, nonspecific inferior ST/T changes - multiple CAD risk factors - we will initiate workup with echocardiogram. Pending results, consinder stress echo. If significant LV dysfunction on echo would proceed with cath - he will start ASA 81mg  daily as we are undergoing workup   F/u 3/4 weeks      Arnoldo Lenis, M.D.

## 2015-10-11 NOTE — Patient Instructions (Signed)
Medication Instructions:  START 81 MG ASPIRIN DAILY   Labwork: I WILL REQUEST LABS FROM PCP  Testing/Procedures: Your physician has requested that you have an echocardiogram. Echocardiography is a painless test that uses sound waves to create images of your heart. It provides your doctor with information about the size and shape of your heart and how well your heart's chambers and valves are working. This procedure takes approximately one hour. There are no restrictions for this procedure.    Follow-Up: Your physician recommends that you schedule a follow-up appointment in: 3-4 WEEKS    Any Other Special Instructions Will Be Listed Below (If Applicable).     If you need a refill on your cardiac medications before your next appointment, please call your pharmacy.

## 2015-10-13 ENCOUNTER — Other Ambulatory Visit: Payer: Self-pay

## 2015-10-13 MED ORDER — DEXLANSOPRAZOLE 60 MG PO CPDR: 60.0000 mg | DELAYED_RELEASE_CAPSULE | Freq: Every day | ORAL | Status: DC

## 2015-10-13 MED ORDER — LINACLOTIDE 145 MCG PO CAPS: 145.0000 ug | ORAL_CAPSULE | Freq: Every day | ORAL | Status: DC

## 2015-10-17 ENCOUNTER — Ambulatory Visit (HOSPITAL_COMMUNITY)
Admission: RE | Admit: 2015-10-17 | Discharge: 2015-10-17 | Disposition: A | Discharge status: Home / Self Care | Payer: BLUE CROSS/BLUE SHIELD | Source: Ambulatory Visit | Attending: Cardiology | Admitting: Cardiology

## 2015-10-17 DIAGNOSIS — R0602 Shortness of breath: Secondary | ICD-10-CM | POA: Diagnosis not present

## 2015-10-17 DIAGNOSIS — I119 Hypertensive heart disease without heart failure: Secondary | ICD-10-CM | POA: Diagnosis not present

## 2015-10-17 DIAGNOSIS — K219 Gastro-esophageal reflux disease without esophagitis: Secondary | ICD-10-CM | POA: Insufficient documentation

## 2015-10-17 DIAGNOSIS — E119 Type 2 diabetes mellitus without complications: Secondary | ICD-10-CM | POA: Insufficient documentation

## 2015-10-17 LAB — ECHOCARDIOGRAM COMPLETE
E decel time: 222 msec
E/e' ratio: 5.76
FS: 32 % (ref 28–44)
IVS/LV PW RATIO, ED: 0.9
LA ID, A-P, ES: 29 mm
LA diam end sys: 29 mm
LA diam index: 1.5 cm/m2
LA vol A4C: 25.6 ml
LA vol index: 16 mL/m2
LA vol: 31.1 mL
LV E/e' medial: 5.76
LV E/e'average: 5.76
LV PW d: 13 mm — AB (ref 0.6–1.1)
LV dias vol index: 31 mL/m2
LV dias vol: 60 mL — AB (ref 62–150)
LV e' LATERAL: 8.38 cm/s
LV sys vol index: 12 mL/m2
LV sys vol: 24 mL (ref 21–61)
LVOT SV: 53 mL
LVOT VTI: 14 cm
LVOT area: 3.8 cm2
LVOT diameter: 22 mm
LVOT peak grad rest: 3 mmHg
LVOT peak vel: 82.5 cm/s
Lateral S' vel: 12.5 cm/s
MV Dec: 222
MV pk A vel: 71.3 m/s
MV pk E vel: 48.3 m/s
Simpson's disk: 60
Stroke v: 36 ml
TAPSE: 22.8 mm
TDI e' lateral: 8.38
TDI e' medial: 5.66

## 2015-10-17 NOTE — Progress Notes (Signed)
*  PRELIMINARY RESULTS* Echocardiogram 2D Echocardiogram has been performed.  Samuel Germany 10/17/2015, 1:45 PM

## 2015-10-20 ENCOUNTER — Telehealth: Payer: Self-pay

## 2015-10-20 DIAGNOSIS — R079 Chest pain, unspecified: Secondary | ICD-10-CM

## 2015-10-20 NOTE — Telephone Encounter (Signed)
-----   Message from Arnoldo Lenis, MD sent at 10/19/2015  2:03 PM EDT ----- Echo overall looks good. Given his symptoms I would like to get a stress echo for him  Zandra Abts MD

## 2015-10-20 NOTE — Telephone Encounter (Signed)
Pt not available, left message on mobile phone to return my call. Put orders in for test. Will send message to Alphonsus Sias to schedule.

## 2015-10-21 ENCOUNTER — Ambulatory Visit: Payer: BLUE CROSS/BLUE SHIELD | Admitting: Gastroenterology

## 2015-10-24 ENCOUNTER — Telehealth: Payer: Self-pay | Admitting: Gastroenterology

## 2015-10-24 ENCOUNTER — Telehealth: Payer: Self-pay

## 2015-10-24 DIAGNOSIS — R079 Chest pain, unspecified: Secondary | ICD-10-CM

## 2015-10-24 NOTE — Telephone Encounter (Signed)
-----   Message from Arnoldo Lenis, MD sent at 10/19/2015  2:03 PM EDT ----- Echo overall looks good. Given his symptoms I would like to get a stress echo for him  Zandra Abts MD

## 2015-10-24 NOTE — Telephone Encounter (Signed)
PA was done and denied by his insurance. Pt must try and fail 3 preferred generics (omeprazole, lansoprazole, pantoprazole and rabeprazole) pt has only tried and failed pantoprazole.

## 2015-10-24 NOTE — Telephone Encounter (Signed)
PT made aware, copy to pcp. Send staff message to Jarrett Soho to schedule stress echo. Pt voiced understanding.

## 2015-10-24 NOTE — Telephone Encounter (Signed)
Trial Prilosec 20 mg once each morning. May trial OTC.

## 2015-10-24 NOTE — Telephone Encounter (Signed)
Pt's wife called to say that their insurance will not pay for Dexilant prescription and a PA needs to be done or either something else cheaper that insurance will cover. Please advise and call wither [phone] or [phone]

## 2015-10-25 NOTE — Telephone Encounter (Signed)
#  10 samples of prilosec otc are at the front desk. The pts wife-Travis Sharp is aware.

## 2015-10-26 ENCOUNTER — Other Ambulatory Visit: Payer: Self-pay | Admitting: "Endocrinology

## 2015-10-27 LAB — TSH: TSH: 1.23 mIU/L (ref 0.40–4.50)

## 2015-10-27 LAB — MICROALBUMIN / CREATININE URINE RATIO
Creatinine, Urine: 165 mg/dL (ref 20–370)
Microalb Creat Ratio: 3 mcg/mg creat (ref <30)
Microalb, Ur: 0.5 mg/dL

## 2015-10-27 LAB — HEMOGLOBIN A1C
Hgb A1c MFr Bld: 6.1 % — ABNORMAL HIGH (ref <5.7)
Mean Plasma Glucose: 128 mg/dL

## 2015-10-27 LAB — LIPID PANEL
Cholesterol: 137 mg/dL (ref 125–200)
HDL: 45 mg/dL (ref >=40)
LDL Cholesterol: 76 mg/dL (ref <130)
Total CHOL/HDL Ratio: 3 Ratio (ref <=5.0)
Triglycerides: 82 mg/dL (ref <150)
VLDL: 16 mg/dL (ref <30)

## 2015-10-27 LAB — T4, FREE: Free T4: 1.1 ng/dL (ref 0.8–1.8)

## 2015-10-27 LAB — BASIC METABOLIC PANEL
BUN: 20 mg/dL (ref 7–25)
CO2: 26 mmol/L (ref 20–31)
Calcium: 9.6 mg/dL (ref 8.6–10.3)
Chloride: 103 mmol/L (ref 98–110)
Creat: 0.89 mg/dL (ref 0.70–1.33)
Glucose, Bld: 117 mg/dL — ABNORMAL HIGH (ref 65–99)
Potassium: 4.1 mmol/L (ref 3.5–5.3)
Sodium: 136 mmol/L (ref 135–146)

## 2015-10-27 LAB — VITAMIN D 25 HYDROXY (VIT D DEFICIENCY, FRACTURES): Vit D, 25-Hydroxy: 19 ng/mL — ABNORMAL LOW (ref 30–100)

## 2015-10-27 LAB — CORTISOL-AM, BLOOD: Cortisol - AM: 12.1 ug/dL

## 2015-10-31 ENCOUNTER — Ambulatory Visit (INDEPENDENT_AMBULATORY_CARE_PROVIDER_SITE_OTHER): Payer: BLUE CROSS/BLUE SHIELD | Admitting: "Endocrinology

## 2015-10-31 ENCOUNTER — Encounter: Payer: Self-pay | Admitting: "Endocrinology

## 2015-10-31 ENCOUNTER — Ambulatory Visit (HOSPITAL_COMMUNITY)
Admission: RE | Admit: 2015-10-31 | Discharge: 2015-10-31 | Disposition: A | Discharge status: Home / Self Care | Payer: BLUE CROSS/BLUE SHIELD | Source: Ambulatory Visit | Attending: Cardiology | Admitting: Cardiology

## 2015-10-31 VITALS — BP 148/92 | HR 90 | Ht 70.0 in | Wt 166.0 lb

## 2015-10-31 DIAGNOSIS — R079 Chest pain, unspecified: Secondary | ICD-10-CM | POA: Diagnosis not present

## 2015-10-31 DIAGNOSIS — E1159 Type 2 diabetes mellitus with other circulatory complications: Secondary | ICD-10-CM

## 2015-10-31 DIAGNOSIS — I10 Essential (primary) hypertension: Secondary | ICD-10-CM | POA: Diagnosis not present

## 2015-10-31 LAB — ECHOCARDIOGRAM STRESS TEST
Estimated workload: 10.1 METS
Exercise duration (min): 8 min
Exercise duration (sec): 27 s
MPHR: 161 {beats}/min
Peak HR: 155 {beats}/min
Percent HR: 96 %
RPE: 14
Rest HR: 84 {beats}/min

## 2015-10-31 NOTE — Progress Notes (Signed)
Subjective:    Patient ID: Travis Sharp, male    DOB: 08/23/1955. Patient is Here to follow-up for management of uncontrolled type 2 diabetes.  Past Medical History  Diagnosis Date  . Hypertension   . Diabetes mellitus   . Colitis 03/23/2015   Past Surgical History  Procedure Laterality Date  . Tonsillectomy    . Colonoscopy with propofol N/A 05/10/2015    Procedure: COLONOSCOPY WITH PROPOFOL;  Surgeon: Danie Binder, MD;  Location: [facility];  Service: Endoscopy;  Laterality: N/A;  0730  . Esophagogastroduodenoscopy (egd) with propofol N/A 05/10/2015    Procedure: ESOPHAGOGASTRODUODENOSCOPY (EGD) WITH PROPOFOL;  Surgeon: Danie Binder, MD;  Location: [facility];  Service: Endoscopy;  Laterality: N/A;  . Savory dilation N/A 05/10/2015    Procedure: SAVORY DILATION;  Surgeon: Danie Binder, MD;  Location: [facility];  Service: Endoscopy;  Laterality: N/A;  . Biopsy  05/10/2015    Procedure: BIOPSY;  Surgeon: Danie Binder, MD;  Location: [facility];  Service: Endoscopy;;  gastric bx's   Social History   Social History  . Marital Status: Married    Spouse Name: N/A  . Number of Children: N/A  . Years of Education: N/A   Occupational History  . [employer]    Social History Main Topics  . Smoking status: Former Smoker -- 1.00 packs/day for 28 years    Quit date: 05/04/2005  . Smokeless tobacco: Never Used  . Alcohol Use: No  . Drug Use: Yes    Special: Marijuana     Comment: marijuana - last used abotu 1 week ago  . Sexual Activity: Not Asked   Other Topics Concern  . None   Social History Narrative   Outpatient Encounter Prescriptions as of 10/31/2015  Medication Sig  . aspirin 81 MG tablet Take 81 mg by mouth daily.  Marland Kitchen BENICAR HCT 40-25 MG tablet Take 1 tablet by mouth daily. RESTART IN 2 DAYS. (Patient taking differently: Take 1 tablet by mouth daily. )  . dexlansoprazole (DEXILANT) 60 MG capsule Take 1 capsule (60 mg total) by mouth daily.  .  insulin aspart (NOVOLOG FLEXPEN) 100 UNIT/ML FlexPen Inject 8-14 Units into the skin 3 (three) times daily with meals.  . Insulin Degludec (TRESIBA FLEXTOUCH) 100 UNIT/ML SOPN Inject 20 Units into the skin at bedtime.  Marland Kitchen linaclotide (LINZESS) 145 MCG CAPS capsule Take 1 capsule (145 mcg total) by mouth daily before breakfast.  . ondansetron (ZOFRAN) 4 MG tablet Take 4 mg by mouth every 8 (eight) hours as needed for nausea or vomiting.  . promethazine (PHENERGAN) 25 MG suppository Place 1 suppository (25 mg total) rectally every 6 (six) hours as needed for nausea or vomiting.  . saccharomyces boulardii (FLORASTOR) 250 MG capsule Take 1 capsule (250 mg total) by mouth 2 (two) times daily.   No facility-administered encounter medications on file as of 10/31/2015.   ALLERGIES: No Known Allergies VACCINATION STATUS: Immunization History  Administered Date(s) Administered  . Influenza,inj,Quad PF,36+ Mos 03/24/2015  . Pneumococcal Polysaccharide-23 03/24/2015    Diabetes He presents for his initial diabetic visit. He has type 2 diabetes mellitus. Onset time: He was diagnosed at approximate age of 60 years. His disease course has been improving (This patient was seen by myself more than 3 years ago, displayed a very serious noncompliance, the separate from care and now being re-referred for diabetes care. In the meantime he lost about 50 pounds .). There are  no hypoglycemic associated symptoms. Pertinent negatives for hypoglycemia include no confusion, headaches, pallor or seizures. Associated symptoms include polydipsia, polyphagia, polyuria and visual change. Pertinent negatives for diabetes include no chest pain, no fatigue and no weakness. There are no hypoglycemic complications. Symptoms are improving. Diabetic complications include a CVA. (Serious noncompliance.) Risk factors for coronary artery disease include dyslipidemia, diabetes mellitus, hypertension, male sex and tobacco exposure. Current  diabetic treatment includes insulin injections. His weight is decreasing steadily (The current weight loss his abnormal due to interval diagnosis of colitis accompanied by insulin deficiency.). He is following a generally unhealthy diet. When asked about meal planning, he reported none. He has not had a previous visit with a dietitian. He rarely participates in exercise. Home blood sugar record trend: He did not bring any meter nor blood glucose readings to review with him. He admits that he is not monitoring blood glucose regularly. An ACE inhibitor/angiotensin II receptor blocker is being taken.  Hypertension This is a chronic problem. The current episode started more than 1 year ago. Pertinent negatives include no chest pain, headaches, neck pain, palpitations or shortness of breath. Risk factors for coronary artery disease include dyslipidemia, diabetes mellitus, male gender and smoking/tobacco exposure. Past treatments include angiotensin blockers. Hypertensive end-organ damage includes CVA.    Review of Systems  Constitutional: Positive for unexpected weight change. Negative for fever, chills and fatigue.  HENT: Negative for dental problem, mouth sores and trouble swallowing.   Eyes: Negative for visual disturbance.  Respiratory: Negative for cough, choking, chest tightness, shortness of breath and wheezing.   Cardiovascular: Negative for chest pain, palpitations and leg swelling.  Gastrointestinal: Negative for nausea, vomiting, abdominal pain, diarrhea, constipation and abdominal distention.  Endocrine: Positive for polydipsia, polyphagia and polyuria.  Genitourinary: Negative for dysuria, urgency, hematuria and flank pain.  Musculoskeletal: Negative for myalgias, back pain, gait problem and neck pain.  Skin: Negative for pallor, rash and wound.  Neurological: Negative for seizures, syncope, weakness, numbness and headaches.  Psychiatric/Behavioral: Negative.  Negative for confusion and  dysphoric mood.    Objective:    BP 148/92 mmHg  Pulse 90  Ht '5\' 10"'  (1.778 m)  Wt 166 lb (75.297 kg)  BMI 23.82 kg/m2  Wt Readings from Last 3 Encounters:  10/31/15 166 lb (75.297 kg)  10/11/15 167 lb (75.751 kg)  09/29/15 160 lb 3.2 oz (72.666 kg)    Physical Exam  Constitutional: He is oriented to person, place, and time. He appears well-developed and well-nourished. He is cooperative. No distress.  HENT:  Head: Normocephalic and atraumatic.  Eyes: EOM are normal.  Neck: Normal range of motion. Neck supple. No tracheal deviation present. No thyromegaly present.  Cardiovascular: Normal rate, S1 normal, S2 normal and normal heart sounds.  Exam reveals no gallop.   No murmur heard. Pulses:      Dorsalis pedis pulses are 1+ on the right side, and 1+ on the left side.       Posterior tibial pulses are 1+ on the right side, and 1+ on the left side.  Pulmonary/Chest: Breath sounds normal. No respiratory distress. He has no wheezes.  Abdominal: Soft. Bowel sounds are normal. He exhibits no distension. There is no tenderness. There is no guarding and no CVA tenderness.  Musculoskeletal: He exhibits no edema.       Right shoulder: He exhibits no swelling and no deformity.  Neurological: He is alert and oriented to person, place, and time. He has normal strength and normal reflexes. No  cranial nerve deficit or sensory deficit. Gait normal.  Skin: Skin is warm and dry. No rash noted. No cyanosis. Nails show no clubbing.  Psychiatric: He has a normal mood and affect. His speech is normal and behavior is normal. Judgment and thought content normal. Cognition and memory are normal.    Recent Results (from the past 2160 hour(s))  CBC with Differential     Status: None   Collection Time: 09/23/15  1:25 AM  Result Value Ref Range   WBC 7.7 4.0 - 10.5 K/uL   RBC 5.44 4.22 - 5.81 MIL/uL   Hemoglobin 16.7 13.0 - 17.0 g/dL   HCT 47.2 39.0 - 52.0 %   MCV 86.8 78.0 - 100.0 fL   MCH 30.7 26.0 -  34.0 pg   MCHC 35.4 30.0 - 36.0 g/dL   RDW 13.1 11.5 - 15.5 %   Platelets 186 150 - 400 K/uL   Neutrophils Relative % 72 %   Neutro Abs 5.5 1.7 - 7.7 K/uL   Lymphocytes Relative 21 %   Lymphs Abs 1.6 0.7 - 4.0 K/uL   Monocytes Relative 7 %   Monocytes Absolute 0.5 0.1 - 1.0 K/uL   Eosinophils Relative 0 %   Eosinophils Absolute 0.0 0.0 - 0.7 K/uL   Basophils Relative 0 %   Basophils Absolute 0.0 0.0 - 0.1 K/uL  Comprehensive metabolic panel     Status: Abnormal   Collection Time: 09/23/15  1:25 AM  Result Value Ref Range   Sodium 137 135 - 145 mmol/L   Potassium 3.4 (L) 3.5 - 5.1 mmol/L   Chloride 103 101 - 111 mmol/L   CO2 24 22 - 32 mmol/L   Glucose, Bld 173 (H) 65 - 99 mg/dL   BUN 15 6 - 20 mg/dL   Creatinine, Ser 0.92 0.61 - 1.24 mg/dL   Calcium 9.8 8.9 - 10.3 mg/dL   Total Protein 8.6 (H) 6.5 - 8.1 g/dL   Albumin 5.1 (H) 3.5 - 5.0 g/dL   AST 32 15 - 41 U/L   ALT 26 17 - 63 U/L   Alkaline Phosphatase 59 38 - 126 U/L   Total Bilirubin 1.9 (H) 0.3 - 1.2 mg/dL   GFR calc non Af Amer >60 >60 mL/min   GFR calc Af Amer >60 >60 mL/min    Comment: (NOTE) The eGFR has been calculated using the CKD EPI equation. This calculation has not been validated in all clinical situations. eGFR's persistently <60 mL/min signify possible Chronic Kidney Disease.    Anion gap 10 5 - 15  Lipase, blood     Status: None   Collection Time: 09/23/15  1:25 AM  Result Value Ref Range   Lipase 17 11 - 51 U/L  CBG monitoring, ED     Status: Abnormal   Collection Time: 09/23/15  1:58 AM  Result Value Ref Range   Glucose-Capillary 163 (H) 65 - 99 mg/dL   Comment 1 Notify RN    Comment 2 Document in Chart   Urinalysis, Routine w reflex microscopic (not at [facility])     Status: Abnormal   Collection Time: 09/23/15  3:35 AM  Result Value Ref Range   Color, Urine YELLOW YELLOW   APPearance CLEAR CLEAR   Specific Gravity, Urine 1.015 1.005 - 1.030   pH 7.0 5.0 - 8.0   Glucose, UA NEGATIVE NEGATIVE  mg/dL   Hgb urine dipstick NEGATIVE NEGATIVE   Bilirubin Urine NEGATIVE NEGATIVE   Ketones, ur 40 (A) NEGATIVE  mg/dL   Protein, ur NEGATIVE NEGATIVE mg/dL   Nitrite NEGATIVE NEGATIVE   Leukocytes, UA NEGATIVE NEGATIVE    Comment: MICROSCOPIC NOT DONE ON URINES WITH NEGATIVE PROTEIN, BLOOD, LEUKOCYTES, NITRITE, OR GLUCOSE <1000 mg/dL.  Urine rapid drug screen (hosp performed)     Status: Abnormal   Collection Time: 09/23/15  3:35 AM  Result Value Ref Range   Opiates NONE DETECTED NONE DETECTED   Cocaine NONE DETECTED NONE DETECTED   Benzodiazepines NONE DETECTED NONE DETECTED   Amphetamines NONE DETECTED NONE DETECTED   Tetrahydrocannabinol POSITIVE (A) NONE DETECTED   Barbiturates NONE DETECTED NONE DETECTED    Comment:        DRUG SCREEN FOR MEDICAL PURPOSES ONLY.  IF CONFIRMATION IS NEEDED FOR ANY PURPOSE, NOTIFY LAB WITHIN 5 DAYS.        LOWEST DETECTABLE LIMITS FOR URINE DRUG SCREEN Drug Class       Cutoff (ng/mL) Amphetamine      1000 Barbiturate      200 Benzodiazepine   284 Tricyclics       132 Opiates          300 Cocaine          300 THC              50   ECHO COMPLETE     Status: Abnormal   Collection Time: 10/17/15  1:44 PM  Result Value Ref Range   LV PW d 13 (A) 0.6 - 1.1 mm   FS 32 28 - 44 %   LA vol 31.1 mL   LA ID, A-P, ES 29 mm   IVS/LV PW RATIO, ED .9    Stroke v 36 ml   LVOT VTI 14 cm   LV e' LATERAL 8.38 cm/s   LV E/e' medial 5.76    LV E/e'average 5.76    LA diam index 1.5 cm/m2   LA vol A4C 25.6 ml   LVOT peak grad rest 3 mmHg   E decel time 222 msec   LVOT diameter 22 mm   LVOT area 3.8 cm2   LVOT peak vel 82.5 cm/s   LVOT SV 53 mL   E/e' ratio 5.76    MV pk E vel 48.3 m/s   MV pk A vel 71.3 m/s   LV sys vol 24 21 - 61 mL   LV sys vol index 12 mL/m2   LV dias vol 60 (A) 62 - 150 mL   LV dias vol index 31 mL/m2   LA vol index 16 mL/m2   MV Dec 222    LA diam end sys 29 mm   Simpson's disk 60    TDI e' medial 5.66    TDI e'  lateral 8.38    Lateral S' vel 12.5 cm/sec   TAPSE 22.8 mm  Microalbumin / creatinine urine ratio     Status: None   Collection Time: 10/26/15  7:58 AM  Result Value Ref Range   Creatinine, Urine 165 20 - 370 mg/dL   Microalb, Ur 0.5 Not estab mg/dL   Microalb Creat Ratio 3 <30 mcg/mg creat    Comment: The ADA has defined abnormalities in albumin excretion as follows:           Category           Result                            (  mcg/mg creatinine)                 Normal:    <30       Microalbuminuria:    30 - 299   Clinical albuminuria:    > or = 300   The ADA recommends that at least two of three specimens collected within a 3 - 6 month period be abnormal before considering a patient to be within a diagnostic category.     Basic metabolic panel     Status: Abnormal   Collection Time: 10/26/15  7:58 AM  Result Value Ref Range   Sodium 136 135 - 146 mmol/L   Potassium 4.1 3.5 - 5.3 mmol/L   Chloride 103 98 - 110 mmol/L   CO2 26 20 - 31 mmol/L   Glucose, Bld 117 (H) 65 - 99 mg/dL   BUN 20 7 - 25 mg/dL   Creat 0.89 0.70 - 1.33 mg/dL    Comment:   For patients > or = 59 years of age: The upper reference limit for Creatinine is approximately 13% higher for people identified as African-American.      Calcium 9.6 8.6 - 10.3 mg/dL  Lipid panel     Status: None   Collection Time: 10/26/15  7:58 AM  Result Value Ref Range   Cholesterol 137 125 - 200 mg/dL   Triglycerides 82 <150 mg/dL   HDL 45 >=40 mg/dL   Total CHOL/HDL Ratio 3.0 <=5.0 Ratio   VLDL 16 <30 mg/dL   LDL Cholesterol 76 <130 mg/dL    Comment:   Total Cholesterol/HDL Ratio:CHD Risk                        Coronary Heart Disease Risk Table                                        Men       Women          1/2 Average Risk              3.4        3.3              Average Risk              5.0        4.4           2X Average Risk              9.6        7.1           3X Average Risk             23.4       11.0 Use  the calculated Patient Ratio above and the CHD Risk table  to determine the patient's CHD Risk.   TSH     Status: None   Collection Time: 10/26/15  7:58 AM  Result Value Ref Range   TSH 1.23 0.40 - 4.50 mIU/L  T4, free     Status: None   Collection Time: 10/26/15  7:58 AM  Result Value Ref Range   Free T4 1.1 0.8 - 1.8 ng/dL  Hemoglobin A1c     Status: Abnormal   Collection Time: 10/26/15  7:58 AM  Result Value Ref Range   Hgb A1c MFr Bld 6.1 (  H) <5.7 %    Comment:   For someone without known diabetes, a hemoglobin A1c value between 5.7% and 6.4% is consistent with prediabetes and should be confirmed with a follow-up test.   For someone with known diabetes, a value <7% indicates that their diabetes is well controlled. A1c targets should be individualized based on duration of diabetes, age, co-morbid conditions and other considerations.   This assay result is consistent with an increased risk of diabetes.   Currently, no consensus exists regarding use of hemoglobin A1c for diagnosis of diabetes in children.      Mean Plasma Glucose 128 mg/dL  Cortisol-am, blood     Status: None   Collection Time: 10/26/15  7:58 AM  Result Value Ref Range   Cortisol - AM 12.1 mcg/dL    Comment:   Reference Range 8 a.m. (7-9 a.m.) Specimen: 4.0-22.0     VITAMIN D 25 Hydroxy (Vit-D Deficiency, Fractures)     Status: Abnormal   Collection Time: 10/26/15  7:58 AM  Result Value Ref Range   Vit D, 25-Hydroxy 19 (L) 30 - 100 ng/mL    Comment: Vitamin D Status           25-OH Vitamin D        Deficiency                <20 ng/mL        Insufficiency         20 - 29 ng/mL        Optimal             > or = 30 ng/mL   For 25-OH Vitamin D testing on patients on D2-supplementation and patients for whom quantitation of D2 and D3 fractions is required, the QuestAssureD 25-OH VIT D, (D2,D3), LC/MS/MS is recommended: order code [id number] (patients > 2 yrs).   ECHO STRESS TEST     Status: None (In  process)   Collection Time: 10/31/15 10:09 AM  Result Value Ref Range   Rest HR 84 bpm   Rest BP 137/94 mmHg   Exercise duration (min) 8 min   Exercise duration (sec) 27 sec   Estimated workload 10.1 METS   Peak HR 155 bpm   Peak BP 214/67 mmHg   MPHR 161 bpm   Percent HR 96 %   RPE 14     Assessment & Plan:   1. Type 2 diabetes mellitus with vascular disease ([place])   - Patient has currently uncontrolled symptomatic type 2 DM since  60 years of age. His a1c has improved to 6.1% improved from 14%. -He came with traumatic improvement in his blood glucose profile and gained 6 pounds since last visit, a good development for him. -His diabetes is complicated by  CVA and noncompliance and patient remains at a high risk for more acute and chronic complications of diabetes which include CAD, CVA, CKD, retinopathy, and neuropathy. These are all discussed in detail with the patient.  - I have counseled the patient on diet management , by adopting a carbohydrate restricted/protein rich diet.  - Suggestion is made for patient to avoid simple carbohydrates   from their diet including Cakes , Desserts, Ice Cream,  Soda (  diet and regular) , Sweet Tea , Candies,  Chips, Cookies, Artificial Sweeteners,   and "Sugar-free" Products . This will help patient to have stable blood glucose profile and potentially avoid unintended weight gain.  - I encouraged the patient to switch to  unprocessed or minimally processed complex starch and increased protein intake (animal or plant source), fruits, and vegetables.  - Patient is advised to stick to a routine mealtimes to eat 3 meals  a day and avoid unnecessary snacks ( to snack only to correct hypoglycemia).  - The patient will be scheduled with Jearld Fenton, RDN, CDE for individualized DM education.  - I have approached patient with the following individualized plan to manage diabetes and patient agrees:   -  Since he came with no meter nor logs, it is  difficult to adjust his insulin doses. - I advised him to resume Tresiba 20 units QHS, and   prandial NovoLog  5 units TIDAC for pre-meal BG readings of 90-177m/dl, plus patient specific correction dose for unexpected hyperglycemia above 1565mdl, associated with strict monitoring of glucose  AC and HS. - Patient is warned not to take insulin without proper monitoring per orders. -Adjustment parameters are given for hypo and hyperglycemia in writing. -Patient is encouraged to call clinic for blood glucose levels less than 70 or above 300 mg /dl. - Insulin is the only and safest therapy he should be getting at this point.  - Patient specific target  A1c;  LDL, HDL, Triglycerides, and  Waist Circumference were discussed in detail.  2) BP/HTN:  Controlled. Continue current medications including ACEI/ARB. 3) Lipids/HPL:  Lipid panel unknown, I would obtain fasting lipid profile along with his next blood work.. Marland Kitchen4)  Weight/Diet: CDE was  initiated , exercise, and detailed carbohydrates information provided.  5) Chronic Care/Health Maintenance:  -Patient is on ACEI/ARB  medications and encouraged to continue to follow up with Ophthalmology, Podiatrist at least yearly or according to recommendations, and advised to  stay away from smoking. I have recommended yearly flu vaccine and pneumonia vaccination at least every 5 years; moderate intensity exercise for up to 150 minutes weekly; and  sleep for at least 7 hours a day.  - 25 minutes of time was spent on the care of this patient , 50% of which was applied for counseling on diabetes complications and their preventions.  - Patient to bring meter and  blood glucose logs during their next visit.   - I advised patient to maintain close follow up with MACollene MaresPA-C for primary care needs.  Follow up plan: - Return in about 1 week (around 11/07/2015) for diabetes, high blood pressure, follow up with meter and logs- no labs.  GeGlade Lloyd MD Phone: [phone]: [phone] 10/31/2015, 1:49 PM

## 2015-10-31 NOTE — Patient Instructions (Signed)

## 2015-10-31 NOTE — Progress Notes (Signed)
*  PRELIMINARY RESULTS* Echocardiogram Echocardiogram Stress Test has been performed.  Travis Sharp 10/31/2015, 10:09 AM

## 2015-11-09 ENCOUNTER — Ambulatory Visit (INDEPENDENT_AMBULATORY_CARE_PROVIDER_SITE_OTHER): Payer: BLUE CROSS/BLUE SHIELD | Admitting: Cardiology

## 2015-11-09 VITALS — BP 128/88 | HR 88 | Ht 71.0 in | Wt 168.0 lb

## 2015-11-09 DIAGNOSIS — R079 Chest pain, unspecified: Secondary | ICD-10-CM | POA: Diagnosis not present

## 2015-11-09 DIAGNOSIS — R0602 Shortness of breath: Secondary | ICD-10-CM

## 2015-11-09 NOTE — Progress Notes (Signed)
Clinical Summary Mr. Magro is a 60 y.o.male seen today for follow up of the following medical problems.   1. SOB - started 3-4 months ago. Noted walking up steps at work that he used to tolerate pretty well - can get SOB around fumes at work as well. Can be laughing and feel SOB. Former tobacco x 20 years - occasional LE edema x 2- 3 months. Occasional orthopnea - occasional chest pains he attributes to heart burn. Sharp pain left or right chest, mild in severity. Can occur at rest or with exertion. No other associated symptoms. Not positional. Lasts just a few seconds. Occurs 1-2 times a week. Ongoing x 2-3 months.  CAD risk: former tobacco, HTN, DM2, father MI 46, paternal aunt MI 61-50s.  - since last visit he completed an echo stress echo that were both overall unremarkable.  - some improvement in symptoms since last visit  SH: son just graduated [employer].  Past Medical History:  Diagnosis Date  . Colitis 03/23/2015  . Diabetes mellitus   . Hypertension      No Known Allergies   Current Outpatient Prescriptions  Medication Sig Dispense Refill  . aspirin 81 MG tablet Take 81 mg by mouth daily.    Marland Kitchen BENICAR HCT 40-25 MG tablet Take 1 tablet by mouth daily. RESTART IN 2 DAYS. (Patient taking differently: Take 1 tablet by mouth daily. )    . dexlansoprazole (DEXILANT) 60 MG capsule Take 1 capsule (60 mg total) by mouth daily. 30 capsule 5  . insulin aspart (NOVOLOG FLEXPEN) 100 UNIT/ML FlexPen Inject 8-14 Units into the skin 3 (three) times daily with meals. 15 mL 2  . Insulin Degludec (TRESIBA FLEXTOUCH) 100 UNIT/ML SOPN Inject 20 Units into the skin at bedtime.    Marland Kitchen linaclotide (LINZESS) 145 MCG CAPS capsule Take 1 capsule (145 mcg total) by mouth daily before breakfast. 30 capsule 5  . ondansetron (ZOFRAN) 4 MG tablet Take 4 mg by mouth every 8 (eight) hours as needed for nausea or vomiting.    . promethazine (PHENERGAN) 25 MG suppository Place 1 suppository  (25 mg total) rectally every 6 (six) hours as needed for nausea or vomiting. 20 each 0  . saccharomyces boulardii (FLORASTOR) 250 MG capsule Take 1 capsule (250 mg total) by mouth 2 (two) times daily. 30 capsule 0   No current facility-administered medications for this visit.      Past Surgical History:  Procedure Laterality Date  . BIOPSY  05/10/2015   Procedure: BIOPSY;  Surgeon: Danie Binder, MD;  Location: [facility];  Service: Endoscopy;;  gastric bx's  . COLONOSCOPY WITH PROPOFOL N/A 05/10/2015   Procedure: COLONOSCOPY WITH PROPOFOL;  Surgeon: Danie Binder, MD;  Location: [facility];  Service: Endoscopy;  Laterality: N/A;  0730  . ESOPHAGOGASTRODUODENOSCOPY (EGD) WITH PROPOFOL N/A 05/10/2015   Procedure: ESOPHAGOGASTRODUODENOSCOPY (EGD) WITH PROPOFOL;  Surgeon: Danie Binder, MD;  Location: [facility];  Service: Endoscopy;  Laterality: N/A;  . SAVORY DILATION N/A 05/10/2015   Procedure: SAVORY DILATION;  Surgeon: Danie Binder, MD;  Location: [facility];  Service: Endoscopy;  Laterality: N/A;  . TONSILLECTOMY       No Known Allergies    Family History  Problem Relation Age of Onset  . Colon cancer      possibly 2 uncles  . Stroke Mother   . Aneurysm Mother   . Kidney failure Brother   . Heart disease  Father      Social History Mr. Comar reports that he quit smoking about 10 years ago. He has a 28.00 pack-year smoking history. He has never used smokeless tobacco. Mr. Friedman reports that he does not drink alcohol.   Review of Systems CONSTITUTIONAL: No weight loss, fever, chills, weakness or fatigue.  HEENT: Eyes: No visual loss, blurred vision, double vision or yellow sclerae.No hearing loss, sneezing, congestion, runny nose or sore throat.  SKIN: No rash or itching.  CARDIOVASCULAR: per HPI RESPIRATORY: No shortness of breath, cough or sputum.  GASTROINTESTINAL: No anorexia, nausea, vomiting or diarrhea. No abdominal pain or blood.  GENITOURINARY: No  burning on urination, no polyuria NEUROLOGICAL: No headache, dizziness, syncope, paralysis, ataxia, numbness or tingling in the extremities. No change in bowel or bladder control.  MUSCULOSKELETAL: No muscle, back pain, joint pain or stiffness.  LYMPHATICS: No enlarged nodes. No history of splenectomy.  PSYCHIATRIC: No history of depression or anxiety.  ENDOCRINOLOGIC: No reports of sweating, cold or heat intolerance. No polyuria or polydipsia.  Marland Kitchen   Physical Examination Vitals:   11/09/15 1327  BP: 128/88  Pulse: 88   Vitals:   11/09/15 1327  Weight: 168 lb (76.2 kg)  Height: 5\' 11"  (1.803 m)    Gen: resting comfortably, no acute distress HEENT: no scleral icterus, pupils equal round and reactive, no palptable cervical adenopathy,  CV: RRR, no m/r/g, no jvd Resp: Clear to auscultation bilaterally GI: abdomen is soft, non-tender, non-distended, normal bowel sounds, no hepatosplenomegaly MSK: extremities are warm, no edema.  Skin: warm, no rash Neuro:  no focal deficits Psych: appropriate affect   Diagnostic Studies 10/2015 echo Study Conclusions  - Left ventricle: The cavity size was normal. Wall thickness was   increased in a pattern of mild LVH. Systolic function was normal.   The estimated ejection fraction was in the range of 55% to 60%.   Wall motion was normal; there were no regional wall motion   abnormalities. Doppler parameters are consistent with abnormal   left ventricular relaxation (grade 1 diastolic dysfunction). - Aortic valve: Valve area (VTI): 2.74 cm^2. Valve area (Vmax):   3.19 cm^2. Valve area (Vmean): 2.76 cm^2.  10/2015 stress echo Study Conclusions  - Stress ECG conclusions: The stress ECG was normal. [facility] scoring:   exercise time of 8 min; maximum ST deviation of 0 mm; no angina;   resulting score is 8. This score predicts a low risk of cardiac   events. - Staged echo: Normal echo stress  Impressions:  - Normal study after maximal  exercise.    Assessment and Plan  1. SOB/Chest pain/Leg swelling - recent symptoms of SOB, LE edema, and chest pain - baseline EKG SR, nonspecific inferior ST/T changes - echo and stress echo overall unremarkable, no evidence of cardiac etiology of his symptoms - no further cardiac workup at this time. Defer further workup to pcp for noncardiac etiologies.   F/u 1 year      Arnoldo Lenis, M.D.

## 2015-11-09 NOTE — Patient Instructions (Signed)
Your physician recommends that you continue on your current medications as directed. Please refer to the Current Medication list given to you today.  Your physician wants you to follow-up in: 1 year. You will receive a reminder letter in the mail two months in advance. If you don't receive a letter, please call our office to schedule the follow-up appointment.  

## 2015-11-11 ENCOUNTER — Encounter: Payer: Self-pay | Admitting: Cardiology

## 2015-11-14 ENCOUNTER — Ambulatory Visit (INDEPENDENT_AMBULATORY_CARE_PROVIDER_SITE_OTHER): Payer: BLUE CROSS/BLUE SHIELD | Admitting: "Endocrinology

## 2015-11-14 ENCOUNTER — Encounter: Payer: Self-pay | Admitting: "Endocrinology

## 2015-11-14 VITALS — BP 146/97 | HR 90 | Ht 71.0 in | Wt 169.0 lb

## 2015-11-14 DIAGNOSIS — E1159 Type 2 diabetes mellitus with other circulatory complications: Secondary | ICD-10-CM

## 2015-11-14 DIAGNOSIS — I10 Essential (primary) hypertension: Secondary | ICD-10-CM

## 2015-11-14 NOTE — Progress Notes (Signed)
Subjective:    Patient ID: Travis Sharp, male    DOB: 17-May-1955. Patient is Here to follow-up for management of uncontrolled type 2 diabetes.  Past Medical History:  Diagnosis Date  . Colitis 03/23/2015  . Diabetes mellitus   . Hypertension    Past Surgical History:  Procedure Laterality Date  . BIOPSY  05/10/2015   Procedure: BIOPSY;  Surgeon: Danie Binder, MD;  [facility]: [facility];  Service: Endoscopy;;  gastric bx's  . COLONOSCOPY WITH PROPOFOL N/A 05/10/2015   Procedure: COLONOSCOPY WITH PROPOFOL;  Surgeon: Danie Binder, MD;  [facility]: [facility];  Service: Endoscopy;  Laterality: N/A;  0730  . ESOPHAGOGASTRODUODENOSCOPY (EGD) WITH PROPOFOL N/A 05/10/2015   Procedure: ESOPHAGOGASTRODUODENOSCOPY (EGD) WITH PROPOFOL;  Surgeon: Danie Binder, MD;  [facility];  Service: Endoscopy;  Laterality: N/A;  . SAVORY DILATION N/A 05/10/2015   Procedure: SAVORY DILATION;  Surgeon: Danie Binder, MD;  Location: [facility];  Service: Endoscopy;  Laterality: N/A;  . TONSILLECTOMY     Social History   Social History  . Marital status: Married    Spouse name: N/A  . Number of children: N/A  . Years of education: N/A   Occupational History  . [employer]    Social History Main Topics  . Smoking status: Former Smoker    Packs/day: 1.00    Years: 28.00    Quit date: 05/04/2005  . Smokeless tobacco: Never Used  . Alcohol use No  . Drug use:     Types: Marijuana     Comment: marijuana - last used abotu 1 week ago  . Sexual activity: Not Asked   Other Topics Concern  . None   Social History Narrative  . None   Outpatient Encounter Prescriptions as of 11/14/2015  Medication Sig  . aspirin 81 MG tablet Take 81 mg by mouth daily.  Marland Kitchen BENICAR HCT 40-25 MG tablet Take 1 tablet by mouth daily. RESTART IN 2 DAYS. (Patient taking differently: Take 1 tablet by mouth daily. )  . dexlansoprazole (DEXILANT) 60 MG capsule Take 1 capsule (60 mg total) by mouth daily.   . insulin aspart (NOVOLOG FLEXPEN) 100 UNIT/ML FlexPen Inject 8-14 Units into the skin 3 (three) times daily with meals.  . Insulin Degludec (TRESIBA FLEXTOUCH) 100 UNIT/ML SOPN Inject 20 Units into the skin at bedtime.  Marland Kitchen linaclotide (LINZESS) 145 MCG CAPS capsule Take 1 capsule (145 mcg total) by mouth daily before breakfast.   No facility-administered encounter medications on file as of 11/14/2015.    ALLERGIES: No Known Allergies VACCINATION STATUS: Immunization History  Administered Date(s) Administered  . Influenza,inj,Quad PF,36+ Mos 03/24/2015  . Pneumococcal Polysaccharide-23 03/24/2015    Diabetes  He presents for his initial diabetic visit. He has type 2 diabetes mellitus. Onset time: He was diagnosed at approximate age of 39 years. His disease course has been improving (This patient was seen by myself more than 3 years ago, displayed a very serious noncompliance, the separate from care and now being re-referred for diabetes care. In the meantime he lost about 50 pounds .). There are no hypoglycemic associated symptoms. Pertinent negatives for hypoglycemia include no confusion, headaches, pallor or seizures. Associated symptoms include polydipsia, polyphagia, polyuria and visual change. Pertinent negatives for diabetes include no chest pain, no fatigue and no weakness. There are no hypoglycemic complications. Symptoms are improving. Diabetic complications include a CVA. (Serious noncompliance.) Risk factors for coronary artery disease include dyslipidemia, diabetes  mellitus, hypertension, male sex and tobacco exposure. Current diabetic treatment includes insulin injections. His weight is increasing steadily. He is following a generally unhealthy diet. When asked about meal planning, he reported none. He has not had a previous visit with a dietitian. He rarely participates in exercise. His breakfast blood glucose range is generally 140-180 mg/dl. His lunch blood glucose range is generally  140-180 mg/dl. His dinner blood glucose range is generally 140-180 mg/dl. His overall blood glucose range is 140-180 mg/dl. An ACE inhibitor/angiotensin II receptor blocker is being taken.  Hypertension  This is a chronic problem. The current episode started more than 1 year ago. Pertinent negatives include no chest pain, headaches, neck pain, palpitations or shortness of breath. Risk factors for coronary artery disease include dyslipidemia, diabetes mellitus, male gender and smoking/tobacco exposure. Past treatments include angiotensin blockers. Hypertensive end-organ damage includes CVA.    Review of Systems  Constitutional: Positive for unexpected weight change. Negative for chills, fatigue and fever.  HENT: Negative for dental problem, mouth sores and trouble swallowing.   Eyes: Negative for visual disturbance.  Respiratory: Negative for cough, choking, chest tightness, shortness of breath and wheezing.   Cardiovascular: Negative for chest pain, palpitations and leg swelling.  Gastrointestinal: Negative for abdominal distention, abdominal pain, constipation, diarrhea, nausea and vomiting.  Endocrine: Positive for polydipsia, polyphagia and polyuria.  Genitourinary: Negative for dysuria, flank pain, hematuria and urgency.  Musculoskeletal: Negative for back pain, gait problem, myalgias and neck pain.  Skin: Negative for pallor, rash and wound.  Neurological: Negative for seizures, syncope, weakness, numbness and headaches.  Psychiatric/Behavioral: Negative.  Negative for confusion and dysphoric mood.    Objective:    BP (!) 146/97   Pulse 90   Ht _0  (1.803 m)   Wt 169 lb (76.7 kg)   SpO2 99%   BMI 23.57 kg/m   Wt Readings from Last 3 Encounters:  11/14/15 169 lb (76.7 kg)  11/09/15 168 lb (76.2 kg)  10/31/15 166 lb (75.3 kg)    Physical Exam  Constitutional: He is oriented to person, place, and time. He appears well-developed and well-nourished. He is cooperative. No  distress.  HENT:  Head: Normocephalic and atraumatic.  Eyes: EOM are normal.  Neck: Normal range of motion. Neck supple. No tracheal deviation present. No thyromegaly present.  Cardiovascular: Normal rate, S1 normal, S2 normal and normal heart sounds.  Exam reveals no gallop.   No murmur heard. Pulses:      Dorsalis pedis pulses are 1+ on the right side, and 1+ on the left side.       Posterior tibial pulses are 1+ on the right side, and 1+ on the left side.  Pulmonary/Chest: Breath sounds normal. No respiratory distress. He has no wheezes.  Abdominal: Soft. Bowel sounds are normal. He exhibits no distension. There is no tenderness. There is no guarding and no CVA tenderness.  Musculoskeletal: He exhibits no edema.       Right shoulder: He exhibits no swelling and no deformity.  Neurological: He is alert and oriented to person, place, and time. He has normal strength and normal reflexes. No cranial nerve deficit or sensory deficit. Gait normal.  Skin: Skin is warm and dry. No rash noted. No cyanosis. Nails show no clubbing.  Psychiatric: He has a normal mood and affect. His speech is normal and behavior is normal. Judgment and thought content normal. Cognition and memory are normal.    Recent Results (from the past 2160 hour(s))  CBC  with Differential     Status: None   Collection Time: 09/23/15  1:25 AM  Result Value Ref Range   WBC 7.7 4.0 - 10.5 K/uL   RBC 5.44 4.22 - 5.81 MIL/uL   Hemoglobin 16.7 13.0 - 17.0 g/dL   HCT 47.2 39.0 - 52.0 %   MCV 86.8 78.0 - 100.0 fL   MCH 30.7 26.0 - 34.0 pg   MCHC 35.4 30.0 - 36.0 g/dL   RDW 13.1 11.5 - 15.5 %   Platelets 186 150 - 400 K/uL   Neutrophils Relative % 72 %   Neutro Abs 5.5 1.7 - 7.7 K/uL   Lymphocytes Relative 21 %   Lymphs Abs 1.6 0.7 - 4.0 K/uL   Monocytes Relative 7 %   Monocytes Absolute 0.5 0.1 - 1.0 K/uL   Eosinophils Relative 0 %   Eosinophils Absolute 0.0 0.0 - 0.7 K/uL   Basophils Relative 0 %   Basophils Absolute  0.0 0.0 - 0.1 K/uL  Comprehensive metabolic panel     Status: Abnormal   Collection Time: 09/23/15  1:25 AM  Result Value Ref Range   Sodium 137 135 - 145 mmol/L   Potassium 3.4 (L) 3.5 - 5.1 mmol/L   Chloride 103 101 - 111 mmol/L   CO2 24 22 - 32 mmol/L   Glucose, Bld 173 (H) 65 - 99 mg/dL   BUN 15 6 - 20 mg/dL   Creatinine, Ser 0.92 0.61 - 1.24 mg/dL   Calcium 9.8 8.9 - 10.3 mg/dL   Total Protein 8.6 (H) 6.5 - 8.1 g/dL   Albumin 5.1 (H) 3.5 - 5.0 g/dL   AST 32 15 - 41 U/L   ALT 26 17 - 63 U/L   Alkaline Phosphatase 59 38 - 126 U/L   Total Bilirubin 1.9 (H) 0.3 - 1.2 mg/dL   GFR calc non Af Amer >60 >60 mL/min   GFR calc Af Amer >60 >60 mL/min    Comment: (NOTE) The eGFR has been calculated using the CKD EPI equation. This calculation has not been validated in all clinical situations. eGFR's persistently <60 mL/min signify possible Chronic Kidney Disease.    Anion gap 10 5 - 15  Lipase, blood     Status: None   Collection Time: 09/23/15  1:25 AM  Result Value Ref Range   Lipase 17 11 - 51 U/L  CBG monitoring, ED     Status: Abnormal   Collection Time: 09/23/15  1:58 AM  Result Value Ref Range   Glucose-Capillary 163 (H) 65 - 99 mg/dL   Comment 1 Notify RN    Comment 2 Document in Chart   Urinalysis, Routine w reflex microscopic (not at [facility])     Status: Abnormal   Collection Time: 09/23/15  3:35 AM  Result Value Ref Range   Color, Urine YELLOW YELLOW   APPearance CLEAR CLEAR   Specific Gravity, Urine 1.015 1.005 - 1.030   pH 7.0 5.0 - 8.0   Glucose, UA NEGATIVE NEGATIVE mg/dL   Hgb urine dipstick NEGATIVE NEGATIVE   Bilirubin Urine NEGATIVE NEGATIVE   Ketones, ur 40 (A) NEGATIVE mg/dL   Protein, ur NEGATIVE NEGATIVE mg/dL   Nitrite NEGATIVE NEGATIVE   Leukocytes, UA NEGATIVE NEGATIVE    Comment: MICROSCOPIC NOT DONE ON URINES WITH NEGATIVE PROTEIN, BLOOD, LEUKOCYTES, NITRITE, OR GLUCOSE <1000 mg/dL.  Urine rapid drug screen (hosp performed)     Status: Abnormal    Collection Time: 09/23/15  3:35 AM  Result Value Ref  Range   Opiates NONE DETECTED NONE DETECTED   Cocaine NONE DETECTED NONE DETECTED   Benzodiazepines NONE DETECTED NONE DETECTED   Amphetamines NONE DETECTED NONE DETECTED   Tetrahydrocannabinol POSITIVE (A) NONE DETECTED   Barbiturates NONE DETECTED NONE DETECTED    Comment:        DRUG SCREEN FOR MEDICAL PURPOSES ONLY.  IF CONFIRMATION IS NEEDED FOR ANY PURPOSE, NOTIFY LAB WITHIN 5 DAYS.        LOWEST DETECTABLE LIMITS FOR URINE DRUG SCREEN Drug Class       Cutoff (ng/mL) Amphetamine      1000 Barbiturate      200 Benzodiazepine   800 Tricyclics       349 Opiates          300 Cocaine          300 THC              50   ECHO COMPLETE     Status: Abnormal   Collection Time: 10/17/15  1:44 PM  Result Value Ref Range   LV PW d 13 (A) 0.6 - 1.1 mm   FS 32 28 - 44 %   LA vol 31.1 mL   LA ID, A-P, ES 29 mm   IVS/LV PW RATIO, ED .9    Stroke v 36 ml   LVOT VTI 14 cm   LV e' LATERAL 8.38 cm/s   LV E/e' medial 5.76    LV E/e'average 5.76    LA diam index 1.5 cm/m2   LA vol A4C 25.6 ml   LVOT peak grad rest 3 mmHg   E decel time 222 msec   LVOT diameter 22 mm   LVOT area 3.8 cm2   LVOT peak vel 82.5 cm/s   LVOT SV 53 mL   E/e' ratio 5.76    MV pk E vel 48.3 m/s   MV pk A vel 71.3 m/s   LV sys vol 24 21 - 61 mL   LV sys vol index 12 mL/m2   LV dias vol 60 (A) 62 - 150 mL   LV dias vol index 31 mL/m2   LA vol index 16 mL/m2   MV Dec 222    LA diam end sys 29 mm   Simpson's disk 60    TDI e' medial 5.66    TDI e' lateral 8.38    Lateral S' vel 12.5 cm/sec   TAPSE 22.8 mm  Microalbumin / creatinine urine ratio     Status: None   Collection Time: 10/26/15  7:58 AM  Result Value Ref Range   Creatinine, Urine 165 20 - 370 mg/dL   Microalb, Ur 0.5 Not estab mg/dL   Microalb Creat Ratio 3 <30 mcg/mg creat    Comment: The ADA has defined abnormalities in albumin excretion as follows:           Category            Result                            (mcg/mg creatinine)                 Normal:    <30       Microalbuminuria:    30 - 299   Clinical albuminuria:    > or = 300   The ADA recommends that at least two of three specimens collected within a 3 -  6 month period be abnormal before considering a patient to be within a diagnostic category.     Basic metabolic panel     Status: Abnormal   Collection Time: 10/26/15  7:58 AM  Result Value Ref Range   Sodium 136 135 - 146 mmol/L   Potassium 4.1 3.5 - 5.3 mmol/L   Chloride 103 98 - 110 mmol/L   CO2 26 20 - 31 mmol/L   Glucose, Bld 117 (H) 65 - 99 mg/dL   BUN 20 7 - 25 mg/dL   Creat 8.97 3.36 - 1.87 mg/dL    Comment:   For patients > or = 59 years of age: The upper reference limit for Creatinine is approximately 13% higher for people identified as African-American.      Calcium 9.6 8.6 - 10.3 mg/dL  Lipid panel     Status: None   Collection Time: 10/26/15  7:58 AM  Result Value Ref Range   Cholesterol 137 125 - 200 mg/dL   Triglycerides 82 <489 mg/dL   HDL 45 >=73 mg/dL   Total CHOL/HDL Ratio 3.0 <=5.0 Ratio   VLDL 16 <30 mg/dL   LDL Cholesterol 76 <572 mg/dL    Comment:   Total Cholesterol/HDL Ratio:CHD Risk                        Coronary Heart Disease Risk Table                                        Men       Women          1/2 Average Risk              3.4        3.3              Average Risk              5.0        4.4           2X Average Risk              9.6        7.1           3X Average Risk             23.4       11.0 Use the calculated Patient Ratio above and the CHD Risk table  to determine the patient's CHD Risk.   TSH     Status: None   Collection Time: 10/26/15  7:58 AM  Result Value Ref Range   TSH 1.23 0.40 - 4.50 mIU/L  T4, free     Status: None   Collection Time: 10/26/15  7:58 AM  Result Value Ref Range   Free T4 1.1 0.8 - 1.8 ng/dL  Hemoglobin K2Q     Status: Abnormal   Collection Time: 10/26/15  7:58  AM  Result Value Ref Range   Hgb A1c MFr Bld 6.1 (H) <5.7 %    Comment:   For someone without known diabetes, a hemoglobin A1c value between 5.7% and 6.4% is consistent with prediabetes and should be confirmed with a follow-up test.   For someone with known diabetes, a value <7% indicates that their diabetes is well controlled. A1c targets should be individualized based on duration of diabetes, age, co-morbid conditions  and other considerations.   This assay result is consistent with an increased risk of diabetes.   Currently, no consensus exists regarding use of hemoglobin A1c for diagnosis of diabetes in children.      Mean Plasma Glucose 128 mg/dL  Cortisol-am, blood     Status: None   Collection Time: 10/26/15  7:58 AM  Result Value Ref Range   Cortisol - AM 12.1 mcg/dL    Comment:   Reference Range 8 a.m. (7-9 a.m.) Specimen: 4.0-22.0     VITAMIN D 25 Hydroxy (Vit-D Deficiency, Fractures)     Status: Abnormal   Collection Time: 10/26/15  7:58 AM  Result Value Ref Range   Vit D, 25-Hydroxy 19 (L) 30 - 100 ng/mL    Comment: Vitamin D Status           25-OH Vitamin D        Deficiency                <20 ng/mL        Insufficiency         20 - 29 ng/mL        Optimal             > or = 30 ng/mL   For 25-OH Vitamin D testing on patients on D2-supplementation and patients for whom quantitation of D2 and D3 fractions is required, the QuestAssureD 25-OH VIT D, (D2,D3), LC/MS/MS is recommended: order code [phone] (patients > 2 yrs).   ECHO STRESS TEST     Status: None   Collection Time: 10/31/15 10:09 AM  Result Value Ref Range   Rest HR 84 bpm   Rest BP 137/94 mmHg   Exercise duration (min) 8 min   Exercise duration (sec) 27 sec   Estimated workload 10.1 METS   Peak HR 155 bpm   Peak BP 214/67 mmHg   MPHR 161 bpm   Percent HR 96 %   RPE 14     Assessment & Plan:   1. Type 2 diabetes mellitus with vascular disease ([place])   - Patient has currently uncontrolled  symptomatic type 2 DM since  60 years of age. His a1c has improved to 6.1% improved from 14%. -He came with traumatic improvement in his blood glucose profile and gained 6 pounds since last visit, a good development for him. -His diabetes is complicated by  CVA and noncompliance and patient remains at a high risk for more acute and chronic complications of diabetes which include CAD, CVA, CKD, retinopathy, and neuropathy. These are all discussed in detail with the patient.  - I have counseled the patient on diet management , by adopting a carbohydrate restricted/protein rich diet.  - Suggestion is made for patient to avoid simple carbohydrates   from their diet including Cakes , Desserts, Ice Cream,  Soda (  diet and regular) , Sweet Tea , Candies,  Chips, Cookies, Artificial Sweeteners,   and "Sugar-free" Products . This will help patient to have stable blood glucose profile and potentially avoid unintended weight gain.  - I encouraged the patient to switch to  unprocessed or minimally processed complex starch and increased protein intake (animal or plant source), fruits, and vegetables.  - Patient is advised to stick to a routine mealtimes to eat 3 meals  a day and avoid unnecessary snacks ( to snack only to correct hypoglycemia).  - The patient will be scheduled with Jearld Fenton, RDN, CDE for individualized DM education.  - I  have approached patient with the following individualized plan to manage diabetes and patient agrees:   - I advised him to continue Tresiba 20 units QHS, and   prandial NovoLog  5 units TIDAC for pre-meal BG readings of 90-163m/dl, plus patient specific correction dose for unexpected hyperglycemia above 1577mdl, associated with strict monitoring of glucose  AC and HS. - Patient is warned not to take insulin without proper monitoring per orders. -Adjustment parameters are given for hypo and hyperglycemia in writing. -Patient is encouraged to call clinic for blood  glucose levels less than 70 or above 300 mg /dl. - Insulin is the only and safest therapy he should be getting at this point.  - Patient specific target  A1c;  LDL, HDL, Triglycerides, and  Waist Circumference were discussed in detail.  2) BP/HTN:  Controlled. Continue current medications including ACEI/ARB. 3) Lipids/HPL:  Lipid panel unknown, I would obtain fasting lipid profile along with his next blood work.. Marland Kitchen4)  Weight/Diet: CDE was  initiated , exercise, and detailed carbohydrates information provided.  5) Chronic Care/Health Maintenance:  -Patient is on ACEI/ARB  medications and encouraged to continue to follow up with Ophthalmology, Podiatrist at least yearly or according to recommendations, and advised to  stay away from smoking. I have recommended yearly flu vaccine and pneumonia vaccination at least every 5 years; moderate intensity exercise for up to 150 minutes weekly; and  sleep for at least 7 hours a day.  - 25 minutes of time was spent on the care of this patient , 50% of which was applied for counseling on diabetes complications and their preventions.  - Patient to bring meter and  blood glucose logs during their next visit.   - I advised [facility]  Follow up plan: - Return in about 3 months (around 02/14/2016) for follow up with pre-visit labs, meter, and logs.  GeGlade LloydMD Phone: [phone]: [phone] 11/14/2015, 4:29 PM

## 2015-12-28 ENCOUNTER — Telehealth: Payer: Self-pay | Admitting: Gastroenterology

## 2015-12-28 NOTE — Telephone Encounter (Signed)
[phone]  PT WIFE CALLED AND STATED THAT THE PATIENT NEEDS A PRESCRIPTION OF PRILOSEC CALLED INTO HIS PHARMACY, [facility] IN [place]

## 2015-12-30 ENCOUNTER — Encounter: Payer: Self-pay | Admitting: Nurse Practitioner

## 2015-12-30 ENCOUNTER — Ambulatory Visit (INDEPENDENT_AMBULATORY_CARE_PROVIDER_SITE_OTHER): Payer: BLUE CROSS/BLUE SHIELD | Admitting: Nurse Practitioner

## 2015-12-30 VITALS — BP 143/95 | HR 67 | Temp 97.3°F | Ht 70.0 in | Wt 176.4 lb

## 2015-12-30 DIAGNOSIS — R112 Nausea with vomiting, unspecified: Secondary | ICD-10-CM

## 2015-12-30 DIAGNOSIS — K5900 Constipation, unspecified: Secondary | ICD-10-CM | POA: Diagnosis not present

## 2015-12-30 DIAGNOSIS — R1310 Dysphagia, unspecified: Secondary | ICD-10-CM | POA: Diagnosis not present

## 2015-12-30 MED ORDER — LINACLOTIDE 145 MCG PO CAPS: 145.0000 ug | ORAL_CAPSULE | Freq: Every day | ORAL | 2 refills | Status: DC

## 2015-12-30 NOTE — Assessment & Plan Note (Signed)
Symptoms significantly improved. Still some symptoms only with roast beef and likely related to altered chewing with poor dentition. He is planning dental work to improve his chewing and would like to see if this helps. Continue to monitor, return for follow-up in 6 months.

## 2015-12-30 NOTE — Assessment & Plan Note (Signed)
Linzess 145 mcg note improved constipation. Only takes as needed. Recommended he take it daily. Call if any adverse effects (diarrhea) and we can reduce the dose or change agent. Follow-up in 6 months, call if any problems before then.

## 2015-12-30 NOTE — Assessment & Plan Note (Signed)
Symptoms resolved ince stopping Marijuana and with prn Zofran. Continue to monitor, follow-up in 6 months.

## 2015-12-30 NOTE — Patient Instructions (Signed)
1. Take Linzess 145 g every day. 2. Call us in 2 weeks and let us know if it is working and if you are continuing to have diarrhea. 3. Return for follow-up in 6 months. 4. Follow through with dental work to help with your chewing.

## 2015-12-30 NOTE — Progress Notes (Addendum)
REVIEWED-NO ADDITIONAL RECOMMENDATIONS.  Referring Provider: Ginger Organ Primary Care Physician:  [facility] Primary GI:  Dr. Oneida Alar  Chief Complaint  Patient presents with  . Gas    bad taste when burps "like rotten"  . Constipation    HPI:   Travis Sharp is a 60 y.o. male who presents For follow-up. The patient was last seen in our office 09/29/2015 for recurrent nausea and vomiting deemed likely can avoid hyperemesis syndrome. CT of the abdomen and pelvis in February 2017 showed some thickening as per previous exam likely due to muscular hypertrophy/diverticulosis. He was given Cipro and Flagyl prophylactically for possible colitis. At his last visit his swallowing was much improved although some residual symptoms with specific foods such as roast beef which she has been trying to avoid. GERD symptoms worse with certain foods like high-fat/fried foods, spicy foods. Frequent belching, still on Protonix. Left-sided abdominal pain with associated constipation and a bowel movement every 2-3 days. Zofran ineffective for his nausea, has Linzess previously for constipation. I had a lengthy discussion with him at that time related to can avoid hyperemesis syndrome and recommended again to stop smoking marijuana. Continue Zofran. Recommend he avoid GERD triggers, stopped his Protonix and started him on Dexilant 60 mg once a day. Linzess 145 g was restarted for constipation. Recommended return for follow-up in 3 months.  Today he states he has stopped smoking Marijuana. This along with Zofran has his N/V essentially resolved. Still having constipation. Linzess 145 mcg helps with constipation. Also with fluctuance and foul smelling burping. This improves/resolves with Linzess. Is only taking it prn. Denies hematochezia, melena. Does have some looser stools with on/off Linzess. Abdominal pain essentially resolved. Swallowing continued improved, some issues with certain  foods but has to get his dental situation straightened out and when that happens thinks his chewing will improve and intermittent dysphagia will resolve. Denies chest pain, dyspnea, dizziness, lightheadedness, syncope, near syncope. Denies any other upper or lower GI symptoms.    Past Medical History:  Diagnosis Date  . Colitis 03/23/2015  . Diabetes mellitus   . Hypertension     Past Surgical History:  Procedure Laterality Date  . BIOPSY  05/10/2015   Procedure: BIOPSY;  Surgeon: Danie Binder, MD;  [facility];  Service: Endoscopy;;  gastric bx's  . COLONOSCOPY WITH PROPOFOL N/A 05/10/2015   Procedure: COLONOSCOPY WITH PROPOFOL;  Surgeon: Danie Binder, MD;  [facility];  Service: Endoscopy;  Laterality: N/A;  0730  . ESOPHAGOGASTRODUODENOSCOPY (EGD) WITH PROPOFOL N/A 05/10/2015   Procedure: ESOPHAGOGASTRODUODENOSCOPY (EGD) WITH PROPOFOL;  Surgeon: Danie Binder, MD;  [facility];  Service: Endoscopy;  Laterality: N/A;  . SAVORY DILATION N/A 05/10/2015   Procedure: SAVORY DILATION;  Surgeon: Danie Binder, MD;  [facility];  Service: Endoscopy;  Laterality: N/A;  . TONSILLECTOMY      Current Outpatient Prescriptions  Medication Sig Dispense Refill  . aspirin 81 MG tablet Take 81 mg by mouth daily.    Marland Kitchen BENICAR HCT 40-25 MG tablet Take 1 tablet by mouth daily. RESTART IN 2 DAYS. (Patient taking differently: Take 1 tablet by mouth daily. )    . dexlansoprazole (DEXILANT) 60 MG capsule Take 1 capsule (60 mg total) by mouth daily. 30 capsule 5  . insulin aspart (NOVOLOG FLEXPEN) 100 UNIT/ML FlexPen Inject 8-14 Units into the skin 3 (three) times daily with meals. (Patient taking differently: Inject 5 Units into  the skin 3 (three) times daily with meals. ) 15 mL 2  . Insulin Degludec (TRESIBA FLEXTOUCH) 100 UNIT/ML SOPN Inject 20 Units into the skin at bedtime.    Marland Kitchen linaclotide (LINZESS) 145 MCG CAPS capsule Take 1 capsule (145 mcg total) by  mouth daily before breakfast. 30 capsule 5   No current facility-administered medications for this visit.     Allergies as of 12/30/2015  . (No Known Allergies)    Family History  Problem Relation Age of Onset  . Colon cancer      possibly 2 uncles  . Stroke Mother   . Aneurysm Mother   . Kidney failure Brother   . Heart disease Father     Social History   Social History  . Marital status: Married    Spouse name: N/A  . Number of children: N/A  . Years of education: N/A   Occupational History  . [employer]    Social History Main Topics  . Smoking status: Former Smoker    Packs/day: 1.00    Years: 28.00    Quit date: 05/04/2005  . Smokeless tobacco: Never Used  . Alcohol use No  . Drug use:     Types: Marijuana     Comment: marijuana - last used abotu 1 week ago  . Sexual activity: Not Asked   Other Topics Concern  . None   Social History Narrative  . None    Review of Systems: 10-point ROS negative except as per HPI.   Physical Exam: BP (!) 143/95   Pulse 67   Temp 97.3 F (36.3 C) (Oral)   Ht 5\' 10"  (1.778 m)   Wt 176 lb 6.4 oz (80 kg)   BMI 25.31 kg/m  General:   Alert and oriented. Pleasant and cooperative. Well-nourished and well-developed.  Ears:  Normal auditory acuity. Cardiovascular:  S1, S2 present without murmurs appreciated. Extremities without clubbing or edema. Respiratory:  Clear to auscultation bilaterally. No wheezes, rales, or rhonchi. No distress.  Gastrointestinal:  +BS, soft, non-tender and non-distended. No HSM noted. No guarding or rebound. No masses appreciated.  Rectal:  Deferred  Musculoskalatal:  Symmetrical without gross deformities. Neurologic:  Alert and oriented x4;  grossly normal neurologically. Psych:  Alert and cooperative. Normal mood and affect. Heme/Lymph/Immune: No excessive bruising noted.    12/30/2015 10:40 AM   Disclaimer: This note was dictated with voice recognition software. Similar sounding  words can inadvertently be transcribed and may not be corrected upon review.

## 2016-01-01 MED ORDER — OMEPRAZOLE 20 MG PO CPDR: DELAYED_RELEASE_CAPSULE | ORAL | 11 refills | Status: DC

## 2016-01-01 NOTE — Addendum Note (Signed)
Addended by: Danie Binder on: 01/01/2016 11:52 AM   Modules accepted: Orders

## 2016-01-01 NOTE — Telephone Encounter (Signed)
PLEASE CALL PT. Rx sent FOR OMEPRAZOLE.  TAKE 30 MINUTES PRIOR TO YOUR MEALS TWICE DAILY.

## 2016-01-02 NOTE — Telephone Encounter (Signed)
Pts wife is aware 

## 2016-01-02 NOTE — Progress Notes (Signed)
CC'ED TO PCP 

## 2016-02-13 ENCOUNTER — Ambulatory Visit: Payer: BLUE CROSS/BLUE SHIELD | Admitting: "Endocrinology

## 2016-06-28 ENCOUNTER — Ambulatory Visit: Payer: BLUE CROSS/BLUE SHIELD | Admitting: Nurse Practitioner

## 2016-07-18 ENCOUNTER — Ambulatory Visit (INDEPENDENT_AMBULATORY_CARE_PROVIDER_SITE_OTHER): Payer: BLUE CROSS/BLUE SHIELD | Admitting: Nurse Practitioner

## 2016-07-18 ENCOUNTER — Encounter: Payer: Self-pay | Admitting: Nurse Practitioner

## 2016-07-18 VITALS — BP 138/91 | HR 94 | Temp 98.7°F | Ht 70.0 in | Wt 189.0 lb

## 2016-07-18 DIAGNOSIS — R112 Nausea with vomiting, unspecified: Secondary | ICD-10-CM

## 2016-07-18 DIAGNOSIS — K5900 Constipation, unspecified: Secondary | ICD-10-CM

## 2016-07-18 DIAGNOSIS — K219 Gastro-esophageal reflux disease without esophagitis: Secondary | ICD-10-CM | POA: Diagnosis not present

## 2016-07-18 NOTE — Assessment & Plan Note (Signed)
Symptoms essentially resolved. Has not had any marijuana in just over 1 year. Recommend he continue to abstain from marijuana as it is likely a contributor to his nausea and vomiting. Return for follow-up in one year.

## 2016-07-18 NOTE — Patient Instructions (Signed)
1. Continue taking your current medications. 2. Return for follow-up in one year. 3. Call us if you have any worsening or returning symptoms.

## 2016-07-18 NOTE — Progress Notes (Signed)
Referring Provider: [facility] Primary Care Physician:  [facility] Primary GI:  Dr. Oneida Alar  Chief Complaint  Patient presents with  . Follow-up    doing alot better    HPI:   Travis Sharp is a 61 y.o. male who presents for follow-up on nausea, vomiting, constipation. He was last seen in our office 12/30/2015 for dysphagia, nausea, vomiting, constipation. At his last visit he stated he did stop smoking marijuana due to possible cannabis hyperemesis syndrome. This along with Zofran has helped his nausea and vomiting, still with constipation although Linzess 145 was helping. Foul-smelling flatulence. Swallowing continues to improve but some issues remain until he gets his dental situation straightened out. Recommended he take Linzess every day, rather than as needed. Requested two-week progress report which was not done. Return for follow-up in 6 months. Follow through with dental work to help with chewing and dysphagia.  Today he states he's doing a lot better. Denies recurrent N/V other than a single flare-up after overeating. Denies abdominal pain, hematochezia, melena. Constipation much better. Is taking Linzess every other day. Has a bowel movement about every day, complete emptying noted. No more bloating. GERD wel managed on Dexilant. Denies chest pain, dyspnea, dizziness, lightheadedness, syncope, near syncope. Denies any other upper or lower GI symptoms.  Past Medical History:  Diagnosis Date  . Colitis 03/23/2015  . Diabetes mellitus   . Hypertension     Past Surgical History:  Procedure Laterality Date  . BIOPSY  05/10/2015   Procedure: BIOPSY;  Surgeon: Danie Binder, MD;  [facility];  Service: Endoscopy;;  gastric bx's  . COLONOSCOPY WITH PROPOFOL N/A 05/10/2015   Procedure: COLONOSCOPY WITH PROPOFOL;  Surgeon: Danie Binder, MD;  [facility];  Service: Endoscopy;  Laterality: N/A;  0730  .  ESOPHAGOGASTRODUODENOSCOPY (EGD) WITH PROPOFOL N/A 05/10/2015   Procedure: ESOPHAGOGASTRODUODENOSCOPY (EGD) WITH PROPOFOL;  Surgeon: Danie Binder, MD;  [facility];  Service: Endoscopy;  Laterality: N/A;  . SAVORY DILATION N/A 05/10/2015   Procedure: SAVORY DILATION;  Surgeon: Danie Binder, MD;  [facility];  Service: Endoscopy;  Laterality: N/A;  . TONSILLECTOMY      Current Outpatient Prescriptions  Medication Sig Dispense Refill  . aspirin 81 MG tablet Take 81 mg by mouth daily.    Marland Kitchen BENICAR HCT 40-25 MG tablet Take 1 tablet by mouth daily. RESTART IN 2 DAYS. (Patient taking differently: Take 1 tablet by mouth daily. )    . dexlansoprazole (DEXILANT) 60 MG capsule Take 1 capsule (60 mg total) by mouth daily. 30 capsule 5  . Insulin Degludec (TRESIBA FLEXTOUCH) 100 UNIT/ML SOPN Inject 20 Units into the skin at bedtime.    Marland Kitchen linaclotide (LINZESS) 145 MCG CAPS capsule Take 1 capsule (145 mcg total) by mouth daily before breakfast. 30 capsule 2   No current facility-administered medications for this visit.     Allergies as of 07/18/2016  . (No Known Allergies)    Family History  Problem Relation Age of Onset  . Colon cancer      possibly 2 uncles  . Stroke Mother   . Aneurysm Mother   . Kidney failure Brother   . Heart disease Father     Social History   Social History  . Marital status: Married    Spouse name: N/A  . Number of children: N/A  . Years of education: N/A   Occupational History  .  [employer]    Social History Main Topics  . Smoking status: Former Smoker    Packs/day: 1.00    Years: 28.00    Quit date: 05/04/2005  . Smokeless tobacco: Never Used  . Alcohol use No  . Drug use: Yes    Types: Marijuana     Comment: Quit marijuana 12/16/15; previously smoked daily  . Sexual activity: Not Asked   Other Topics Concern  . None   Social History Narrative  . None    Review of Systems: Complete ROS negative except as per  HPI.   Physical Exam: BP (!) 138/91   Pulse 94   Temp 98.7 F (37.1 C) (Oral)   Ht 5\' 10"  (1.778 m)   Wt 189 lb (85.7 kg)   BMI 27.12 kg/m  General:   Alert and oriented. Pleasant and cooperative. Well-nourished and well-developed.  Head:  Normocephalic and atraumatic. Eyes:  Without icterus, sclera clear and conjunctiva pink.  Ears:  Normal auditory acuity. Cardiovascular:  S1, S2 present without murmurs appreciated. Extremities without clubbing or edema. Respiratory:  Clear to auscultation bilaterally. No wheezes, rales, or rhonchi. No distress.  Gastrointestinal:  +BS, soft, non-tender and non-distended. No HSM noted. No guarding or rebound. No masses appreciated.  Rectal:  Deferred  Musculoskalatal:  Symmetrical without gross deformities. Neurologic:  Alert and oriented x4;  grossly normal neurologically. Psych:  Alert and cooperative. Normal mood and affect. Heme/Lymph/Immune: No excessive bruising noted.    07/18/2016 4:03 PM   Disclaimer: This note was dictated with voice recognition software. Similar sounding words can inadvertently be transcribed and may not be corrected upon review.

## 2016-07-18 NOTE — Assessment & Plan Note (Signed)
Constipation significantly improved. Is taking Linzess 145 g every other day and having a bowel movement daily with complete evacuation. No more bloating. Recommend continue Linzess every other day. Return for follow-up in one year. Call us if any worsening or recurrent symptoms.

## 2016-07-18 NOTE — Assessment & Plan Note (Signed)
GERD symptoms well controlled on Dexilant. Continue current medications. Return for follow-up in one year.

## 2016-07-19 NOTE — Progress Notes (Signed)
CC'ED TO PCP 

## 2016-07-23 ENCOUNTER — Ambulatory Visit (HOSPITAL_COMMUNITY)
Admission: RE | Admit: 2016-07-23 | Discharge: 2016-07-23 | Disposition: A | Discharge status: Home / Self Care | Payer: BLUE CROSS/BLUE SHIELD | Source: Ambulatory Visit | Attending: Registered Nurse | Admitting: Registered Nurse

## 2016-07-23 ENCOUNTER — Other Ambulatory Visit (HOSPITAL_COMMUNITY): Payer: Self-pay | Admitting: Registered Nurse

## 2016-07-23 DIAGNOSIS — M5412 Radiculopathy, cervical region: Secondary | ICD-10-CM | POA: Diagnosis not present

## 2016-07-23 DIAGNOSIS — M542 Cervicalgia: Secondary | ICD-10-CM

## 2016-07-30 NOTE — Progress Notes (Signed)
REVIEWED-NO ADDITIONAL RECOMMENDATIONS. 

## 2016-12-12 ENCOUNTER — Encounter (HOSPITAL_COMMUNITY): Payer: Self-pay | Admitting: Emergency Medicine

## 2016-12-12 ENCOUNTER — Emergency Department (HOSPITAL_COMMUNITY)
Admission: EM | Admit: 2016-12-12 | Discharge: 2016-12-12 | Disposition: A | Discharge status: Home / Self Care | Payer: BLUE CROSS/BLUE SHIELD | Attending: Emergency Medicine | Admitting: Emergency Medicine

## 2016-12-12 DIAGNOSIS — I10 Essential (primary) hypertension: Secondary | ICD-10-CM | POA: Diagnosis not present

## 2016-12-12 DIAGNOSIS — E119 Type 2 diabetes mellitus without complications: Secondary | ICD-10-CM | POA: Diagnosis not present

## 2016-12-12 DIAGNOSIS — Z87891 Personal history of nicotine dependence: Secondary | ICD-10-CM | POA: Insufficient documentation

## 2016-12-12 DIAGNOSIS — Z794 Long term (current) use of insulin: Secondary | ICD-10-CM | POA: Diagnosis not present

## 2016-12-12 DIAGNOSIS — R1110 Vomiting, unspecified: Secondary | ICD-10-CM | POA: Diagnosis not present

## 2016-12-12 DIAGNOSIS — Z79899 Other long term (current) drug therapy: Secondary | ICD-10-CM | POA: Insufficient documentation

## 2016-12-12 DIAGNOSIS — R109 Unspecified abdominal pain: Secondary | ICD-10-CM | POA: Diagnosis present

## 2016-12-12 DIAGNOSIS — Z7982 Long term (current) use of aspirin: Secondary | ICD-10-CM | POA: Insufficient documentation

## 2016-12-12 DIAGNOSIS — F1290 Cannabis use, unspecified, uncomplicated: Secondary | ICD-10-CM

## 2016-12-12 DIAGNOSIS — F12988 Cannabis use, unspecified with other cannabis-induced disorder: Secondary | ICD-10-CM | POA: Diagnosis not present

## 2016-12-12 HISTORY — DX: Gastritis, unspecified, without bleeding: K29.70

## 2016-12-12 LAB — COMPREHENSIVE METABOLIC PANEL
ALT: 25 U/L (ref 17–63)
AST: 27 U/L (ref 15–41)
Albumin: 4.8 g/dL (ref 3.5–5.0)
Alkaline Phosphatase: 63 U/L (ref 38–126)
Anion gap: 13 (ref 5–15)
BUN: 11 mg/dL (ref 6–20)
CO2: 23 mmol/L (ref 22–32)
Calcium: 9.5 mg/dL (ref 8.9–10.3)
Chloride: 99 mmol/L — ABNORMAL LOW (ref 101–111)
Creatinine, Ser: 1.01 mg/dL (ref 0.61–1.24)
GFR calc Af Amer: >60 mL/min (ref >60)
GFR calc non Af Amer: >60 mL/min (ref >60)
Glucose, Bld: 301 mg/dL — ABNORMAL HIGH (ref 65–99)
Potassium: 3.2 mmol/L — ABNORMAL LOW (ref 3.5–5.1)
Sodium: 135 mmol/L (ref 135–145)
Total Bilirubin: 1.4 mg/dL — ABNORMAL HIGH (ref 0.3–1.2)
Total Protein: 8.1 g/dL (ref 6.5–8.1)

## 2016-12-12 LAB — URINALYSIS, ROUTINE W REFLEX MICROSCOPIC
Bilirubin Urine: NEGATIVE
Glucose, UA: >=500 mg/dL — AB
Ketones, ur: 20 mg/dL — AB
Leukocytes, UA: NEGATIVE
Nitrite: NEGATIVE
Protein, ur: 30 mg/dL — AB
Specific Gravity, Urine: 1.026 (ref 1.005–1.030)
Squamous Epithelial / LPF: NONE SEEN
pH: 5 (ref 5.0–8.0)

## 2016-12-12 LAB — CBC
HCT: 45.1 % (ref 39.0–52.0)
Hemoglobin: 16.4 g/dL (ref 13.0–17.0)
MCH: 30.6 pg (ref 26.0–34.0)
MCHC: 36.4 g/dL — ABNORMAL HIGH (ref 30.0–36.0)
MCV: 84.1 fL (ref 78.0–100.0)
Platelets: 161 10*3/uL (ref 150–400)
RBC: 5.36 MIL/uL (ref 4.22–5.81)
RDW: 12.9 % (ref 11.5–15.5)
WBC: 11.3 10*3/uL — ABNORMAL HIGH (ref 4.0–10.5)

## 2016-12-12 LAB — RAPID URINE DRUG SCREEN, HOSP PERFORMED
Amphetamines: NOT DETECTED
Barbiturates: NOT DETECTED
Benzodiazepines: NOT DETECTED
Cocaine: NOT DETECTED
Opiates: NOT DETECTED
Tetrahydrocannabinol: POSITIVE — AB

## 2016-12-12 LAB — LIPASE, BLOOD: Lipase: 23 U/L (ref 11–51)

## 2016-12-12 MED ORDER — ONDANSETRON HCL 4 MG/2ML IJ SOLN
4.0000 mg | Freq: Once | INTRAMUSCULAR | Status: AC
  Administered 2016-12-12: 4 mg via INTRAVENOUS
  Filled 2016-12-12: qty 2

## 2016-12-12 MED ORDER — SODIUM CHLORIDE 0.9 % IV BOLUS (SEPSIS)
1000.0000 mL | Freq: Once | INTRAVENOUS | Status: AC
  Administered 2016-12-12: 1000 mL via INTRAVENOUS

## 2016-12-12 MED ORDER — CAPSAICIN 0.025 % EX CREA
TOPICAL_CREAM | Freq: Two times a day (BID) | CUTANEOUS | Status: DC
  Administered 2016-12-12: 12:00:00 via TOPICAL
  Filled 2016-12-12: qty 60

## 2016-12-12 MED ORDER — HALOPERIDOL LACTATE 5 MG/ML IJ SOLN
5.0000 mg | Freq: Once | INTRAMUSCULAR | Status: AC
  Administered 2016-12-12: 5 mg via INTRAVENOUS
  Filled 2016-12-12: qty 1

## 2016-12-12 NOTE — ED Notes (Signed)
Pt feels no pain in abdomen and also does not feel the need to vomit.

## 2016-12-12 NOTE — ED Provider Notes (Signed)
[facility] DEPT Provider Note   CSN: [id number] Arrival date & time: 12/12/16  0801     History   Chief Complaint Chief Complaint  Patient presents with  . Abdominal Pain    HPI Travis Sharp is a 61 y.o. male.  He presents for evaluation of vomiting, but is unable to state when it started or any other complaints.  He is with family members who relate that he has been smoking marijuana, and feel that that is likely why he is vomiting.  Level 5 caveat-severe illness  HPI  Past Medical History:  Diagnosis Date  . Colitis 03/23/2015  . Diabetes mellitus   . Gastritis   . Hypertension     Patient Active Problem List   Diagnosis Date Noted  . Constipation 10/03/2015  . Nausea with vomiting 10/03/2015  . Loss of weight 07/15/2015  . Intractable nausea and vomiting 05/18/2015  . Dysphagia 04/26/2015  . GERD (gastroesophageal reflux disease) 04/26/2015  . Hypokalemia 03/25/2015  . Type 2 diabetes mellitus with vascular disease ([place]) 03/24/2015  . Colitis 03/23/2015  . Nausea and vomiting 03/23/2015  . Essential hypertension 03/23/2015  . RECTAL BLEEDING 09/29/2008    Past Surgical History:  Procedure Laterality Date  . BIOPSY  05/10/2015   Procedure: BIOPSY;  Surgeon: Danie Binder, MD;  [facility];  Service: Endoscopy;;  gastric bx's  . COLONOSCOPY WITH PROPOFOL N/A 05/10/2015   Procedure: COLONOSCOPY WITH PROPOFOL;  Surgeon: Danie Binder, MD;  [facility];  Service: Endoscopy;  Laterality: N/A;  0730  . ESOPHAGOGASTRODUODENOSCOPY (EGD) WITH PROPOFOL N/A 05/10/2015   Procedure: ESOPHAGOGASTRODUODENOSCOPY (EGD) WITH PROPOFOL;  Surgeon: Danie Binder, MD;  [facility];  Service: Endoscopy;  Laterality: N/A;  . SAVORY DILATION N/A 05/10/2015   Procedure: SAVORY DILATION;  Surgeon: Danie Binder, MD;  [facility];  Service: Endoscopy;  Laterality: N/A;  . TONSILLECTOMY         Home Medications    Prior to  Admission medications   Medication Sig Start Date End Date Taking? Authorizing Provider  aspirin 81 MG tablet Take 81 mg by mouth daily.    [provider]  BENICAR HCT 40-25 MG tablet Take 1 tablet by mouth daily. RESTART IN 2 DAYS. Patient taking differently: Take 1 tablet by mouth daily.  03/27/15   Rexene Alberts, MD  dexlansoprazole (DEXILANT) 60 MG capsule Take 1 capsule (60 mg total) by mouth daily. 10/13/15   Carlis Stable, NP  linaclotide Rolan Lipa) 145 MCG CAPS capsule Take 1 capsule (145 mcg total) by mouth daily before breakfast. 12/30/15   Carlis Stable, NP  TRESIBA FLEXTOUCH 200 UNIT/ML SOPN Inject 30 Units into the skin at bedtime. 11/16/16   [provider]    Family History Family History  Problem Relation Age of Onset  . Colon cancer Unknown        possibly 2 uncles  . Stroke Mother   . Aneurysm Mother   . Kidney failure Brother   . Heart disease Father     Social History Social History  Substance Use Topics  . Smoking status: Former Smoker    Packs/day: 1.00    Years: 28.00    Quit date: 05/04/2005  . Smokeless tobacco: Never Used  . Alcohol use No     Allergies   Patient has no known allergies.   Review of Systems Review of Systems  Unable to perform ROS: Acuity of condition  Physical Exam Updated Vital Signs BP 122/90 (BP Location: Left Arm)   Pulse (!) 102   Temp 98.4 F (36.9 C) (Oral)   Resp 15   Ht 5\' 10"  (1.778 m)   Wt 85.7 kg (189 lb)   SpO2 100%   BMI 27.12 kg/m   Physical Exam  Constitutional: He appears well-developed and well-nourished.  HENT:  Head: Normocephalic and atraumatic.  Right Ear: External ear normal.  Left Ear: External ear normal.  Eyes: Pupils are equal, round, and reactive to light. Conjunctivae and EOM are normal.  Neck: Normal range of motion and phonation normal. Neck supple.  Cardiovascular: Normal rate.   Pulmonary/Chest: Effort normal. He exhibits no bony tenderness.  Abdominal:    Copious persistent vomiting, mucus with blood-tinged.  Musculoskeletal: Normal range of motion.  Neurological: He is alert. No cranial nerve deficit or sensory deficit. He exhibits normal muscle tone. Coordination normal.  Skin: Skin is warm, dry and intact.  Psychiatric: He has a normal mood and affect. His behavior is normal.  Nursing note and vitals reviewed.    ED Treatments / Results  Labs (all labs ordered are listed, but only abnormal results are displayed) Labs Reviewed  COMPREHENSIVE METABOLIC PANEL - Abnormal; Notable for the following:       Result Value   Potassium 3.2 (*)    Chloride 99 (*)    Glucose, Bld 301 (*)    Total Bilirubin 1.4 (*)    All other components within normal limits  CBC - Abnormal; Notable for the following:    WBC 11.3 (*)    MCHC 36.4 (*)    All other components within normal limits  URINALYSIS, ROUTINE W REFLEX MICROSCOPIC - Abnormal; Notable for the following:    Glucose, UA >=500 (*)    Hgb urine dipstick SMALL (*)    Ketones, ur 20 (*)    Protein, ur 30 (*)    Bacteria, UA RARE (*)    All other components within normal limits  RAPID URINE DRUG SCREEN, HOSP PERFORMED - Abnormal; Notable for the following:    Tetrahydrocannabinol POSITIVE (*)    All other components within normal limits  LIPASE, BLOOD    EKG  EKG Interpretation None       Radiology No results found.  Procedures Procedures (including critical care time)  Medications Ordered in ED Medications  capsaicin (ZOSTRIX) 0.025 % cream ( Topical Given 12/12/16 1200)  sodium chloride 0.9 % bolus 1,000 mL (0 mLs Intravenous Stopped 12/12/16 1416)  ondansetron (ZOFRAN) injection 4 mg (4 mg Intravenous Given 12/12/16 0938)  haloperidol lactate (HALDOL) injection 5 mg (5 mg Intravenous Given 12/12/16 1014)     Initial Impression / Assessment and Plan / ED Course  I have reviewed the triage vital signs and the nursing notes.  Pertinent labs & imaging results that were  available during my care of the patient were reviewed by me and considered in my medical decision making (see chart for details).      Patient Vitals for the past 24 hrs:  BP Temp Temp src Pulse Resp SpO2 Height Weight  12/12/16 1416 122/90 - - (!) 102 15 100 % - -  12/12/16 1132 (!) 156/91 - - 85 16 100 % - -  12/12/16 0829 - - - - - - 5\' 10"  (1.778 m) 85.7 kg (189 lb)  12/12/16 0828 - 98.4 F (36.9 C) Oral 82 18 100 % - -    3:44 PM Reevaluation with update  and discussion. After initial assessment and treatment, an updated evaluation reveals patient is alert, amatory and in no distress.  Findings discussed with patient and family member, all questions answered. Kohner Orlick L      Final Clinical Impressions(s) / ED Diagnoses   Final diagnoses:  Cannabinoid hyperemesis syndrome (HCC)    Vomiting secondary to marijuana ingestion.  Patient clinically stable with reassuring evaluation and laboratory testing.  Mild glucose elevation likely related to distress.  Nursing Notes Reviewed/ Care Coordinated Applicable Imaging Reviewed Interpretation of Laboratory Data incorporated into ED treatment  The patient appears reasonably screened and/or stabilized for discharge and I doubt any other medical condition or other [facility] requiring further screening, evaluation, or treatment in the ED at this time prior to discharge.  Plan: Home Medications-continue usual treatments; Home Treatments-rest, gradually advance diet, avoid all forms of THC products; return here if the recommended treatment, does not improve the symptoms; Recommended follow up-PCP checkup 1 week and as needed   New Prescriptions New Prescriptions   No medications on file     Daleen Bo, MD 12/12/16 1546

## 2016-12-12 NOTE — ED Notes (Signed)
Patient ambulated in hall.  Patient did not have any problem walking with assistance.  Patient stated he just felt "alittle off balance due to laying for so long".

## 2016-12-12 NOTE — ED Triage Notes (Signed)
Pt reports history of same. Pt reports abd pain and emesis that started this am while at work. Dry heaves and emesis x2 noted in triage. Pt reports attempted to take zofran at home with no relief.

## 2016-12-12 NOTE — Discharge Instructions (Signed)
Avoid all forms of marijuana.  Use the Zostrix cream, 3 times a day if needed for ongoing symptoms of nausea and vomiting associated with use of marijuana.  When you use at apply a 10-12 inch area onto the abdominal wall, using a small film of the medication.  It will make your skin tingle, but it should help with the nausea and vomiting associated with use of marijuana.

## 2017-02-08 IMAGING — CT CT ABD-PELV W/ CM
2 of 5 series · 16 of 46 positions shown, 18 images · IV contrast (Omnipaque 300)
Comparison: None.

CLINICAL DATA: 59-year-old male with 1 month of left side abdominal
pain. Black stools. Initial encounter.

EXAM:
CT ABDOMEN AND PELVIS WITH CONTRAST
TECHNIQUE: Multidetector CT imaging of the abdomen and pelvis was performed
using the standard protocol following bolus administration of
intravenous contrast.
CONTRAST:  100mL OMNIPAQUE IOHEXOL 300 MG/ML  SOLN

[Series 2: abd_pel_with 5.0 b40f · axial · 0.68mm/px · z∈[-448,-52]mm · 13 of 89 slices shown, 15 images]
[im 5/89  soft-tissue]
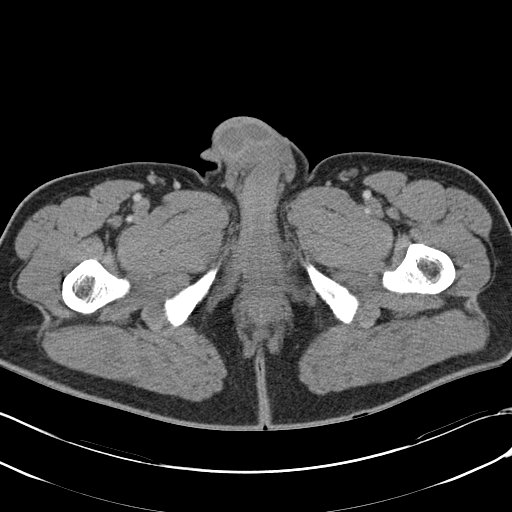
[im 5/89  bone]
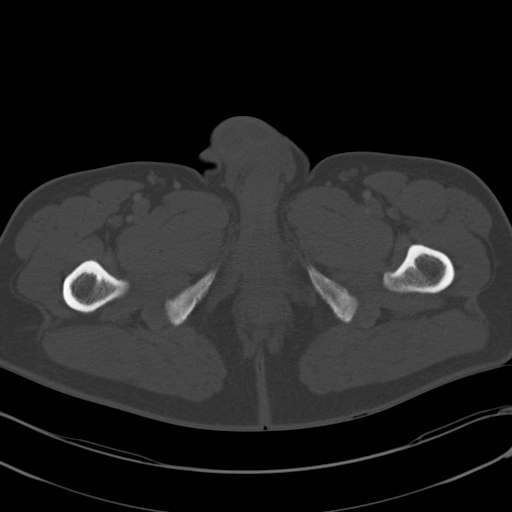
[im 10/89  soft-tissue]
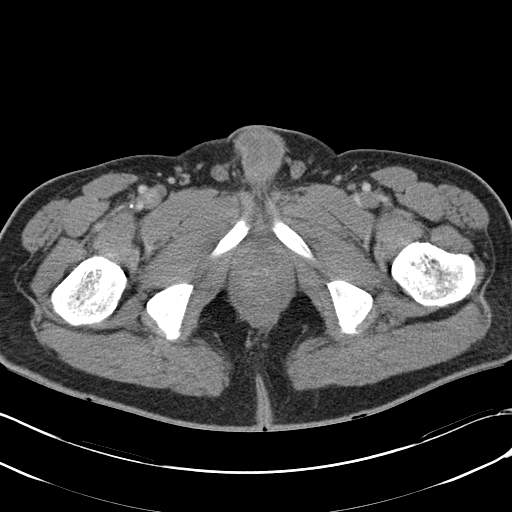
[im 20/89  soft-tissue]
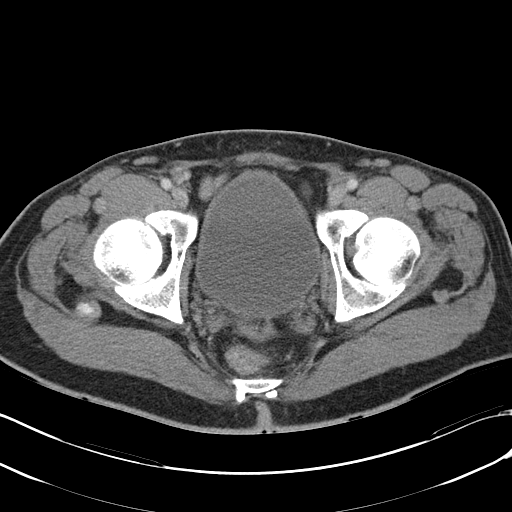
[im 25/89  soft-tissue]
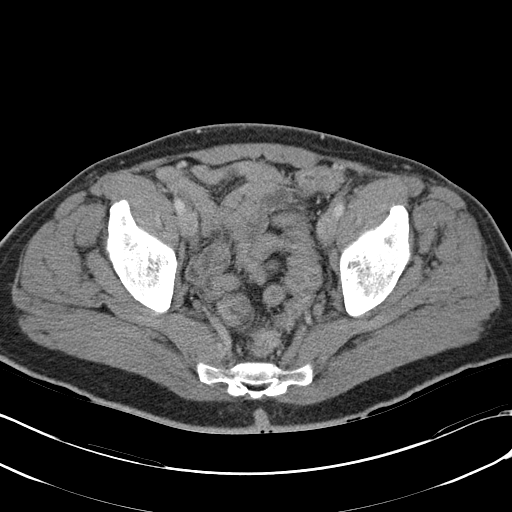
[im 30/89  soft-tissue]
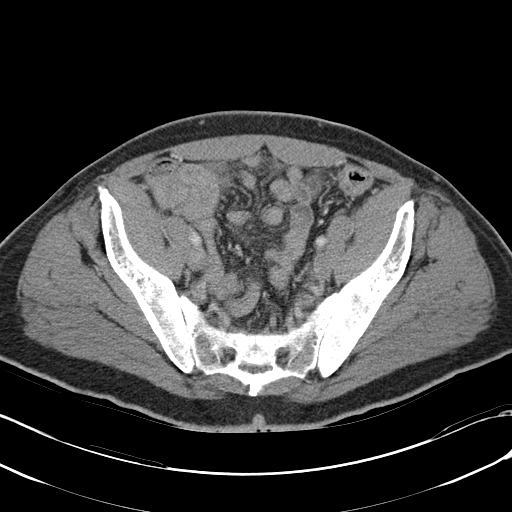
[im 40/89  soft-tissue]
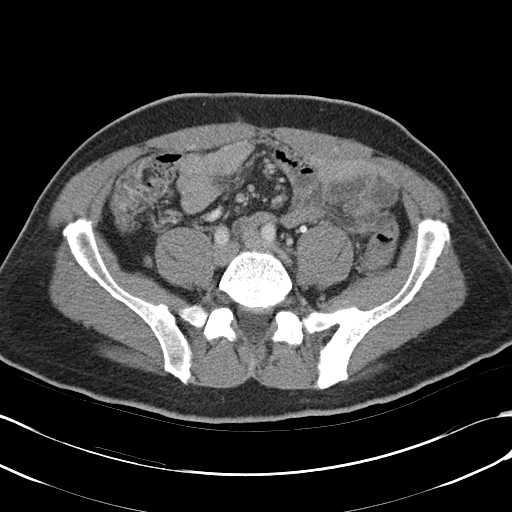
[im 45/89  soft-tissue]
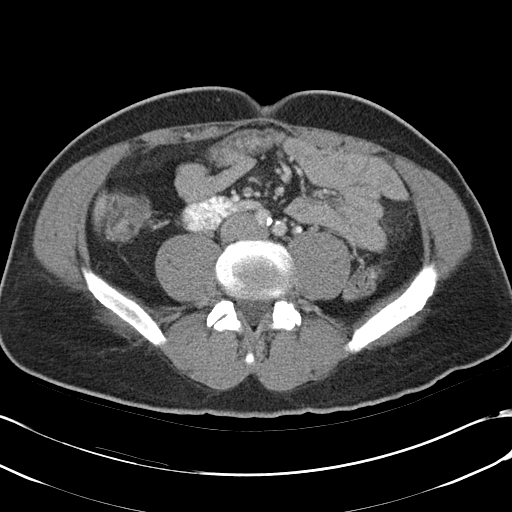
[im 49/89  soft-tissue]
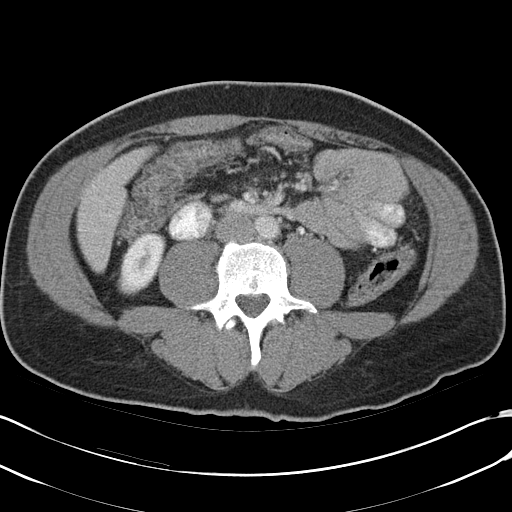
[im 59/89  soft-tissue]
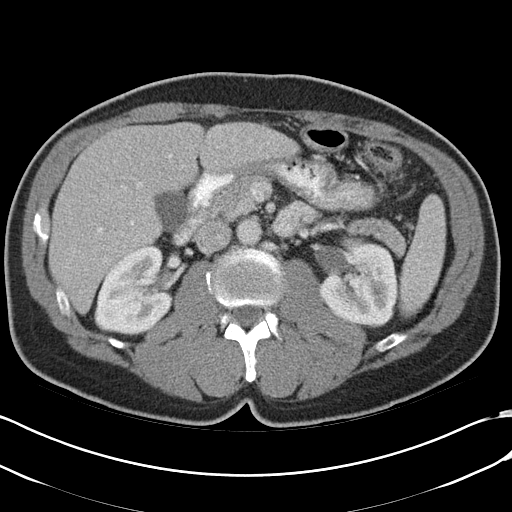
[im 59/89  bone]
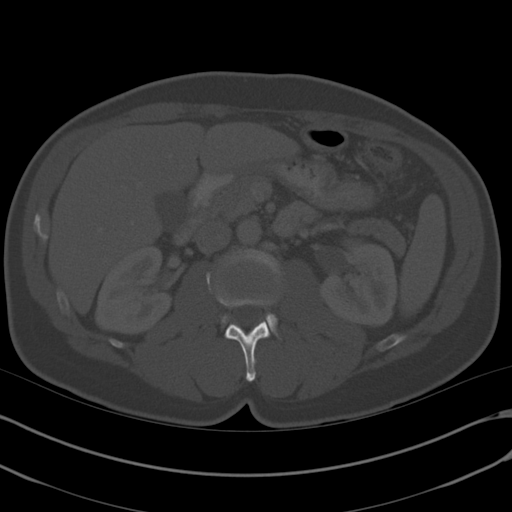
[im 64/89  soft-tissue]
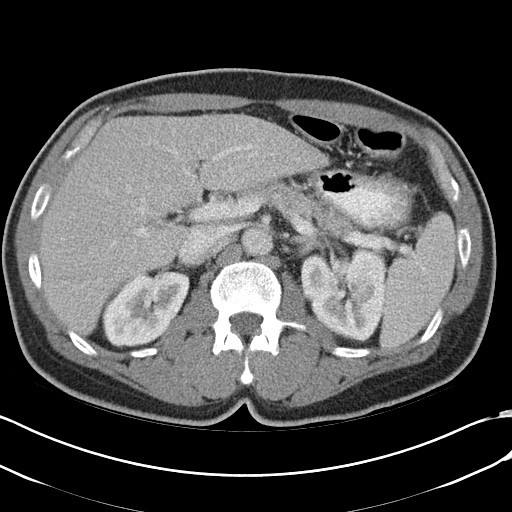
[im 69/89  soft-tissue]
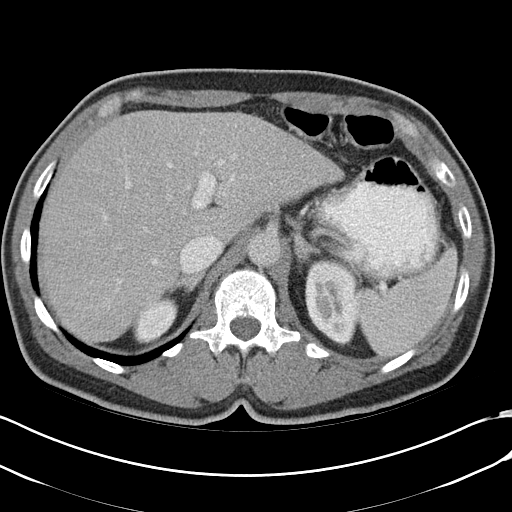
[im 79/89  soft-tissue]
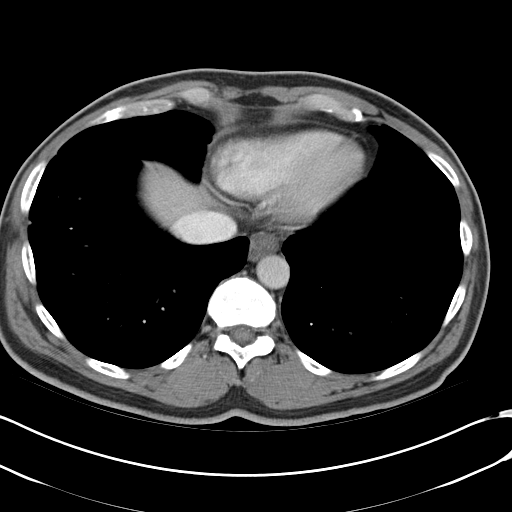
[im 84/89  soft-tissue]
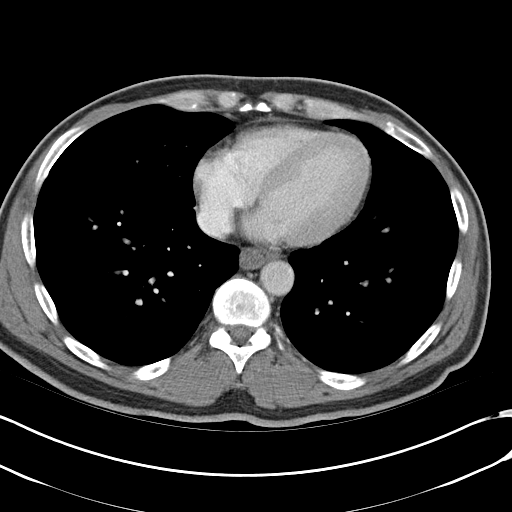

[Series 4: abd_pel_with 3.0 spo · coronal · 0.70mm/px · 3 of 77 slices shown]
[im 26/77  soft-tissue]
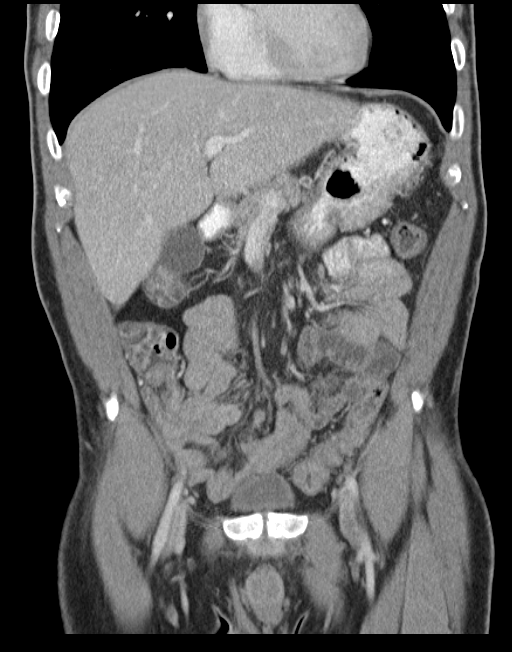
[im 34/77  soft-tissue]
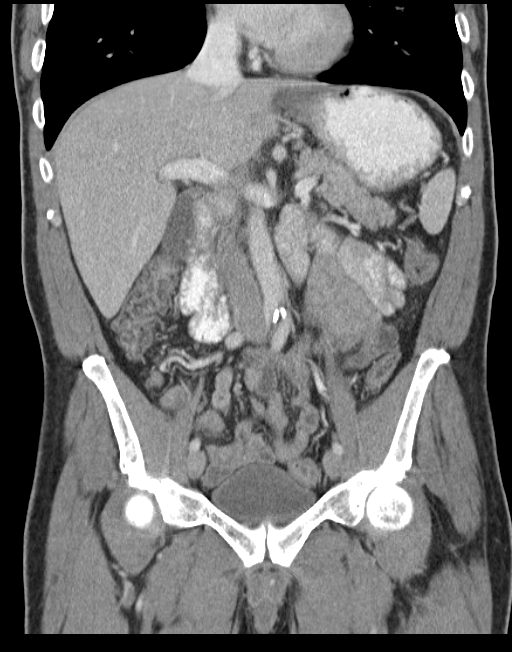
[im 43/77  soft-tissue]
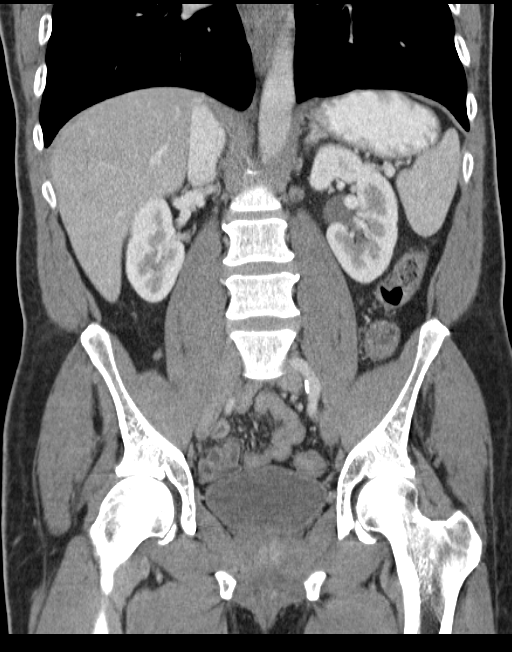

[16 of 46 positions shown; findings below may reference images not displayed]

FINDINGS: Negative lung bases.  No pericardial or pleural effusion.

No acute osseous abnormality identified.

No pelvic free fluid.  Unremarkable urinary bladder.

The colon is mostly decompressed throughout the abdomen and appears
mildly to moderately thick walled throughout. This is most apparent
in the sigmoid colon (wall thickness up to 10 mm). There is
generalized mild mesenteric stranding/congestion. The appendix is
normal. No diverticulosis identified.

No pneumoperitoneum. No abdominal free fluid. No dilated or abnormal
small bowel loops. Small volume of oral contrast in the stomach,
duodenum and jejunum.

Liver, gallbladder, spleen, pancreas, and adrenal glands are within
normal limits. Portal venous system within normal limits. Bilateral
renal enhancement and contrast excretion within normal limits.
Aortoiliac calcified atherosclerosis noted. Major arterial
structures are patent. No lymphadenopathy.
IMPRESSION: 1. Mild-to-moderate diffuse colitis suspected. No associated free
fluid or complicating features.
2. No other acute findings.  Calcified aortic atherosclerosis.

## 2017-04-04 DIAGNOSIS — E782 Mixed hyperlipidemia: Secondary | ICD-10-CM | POA: Diagnosis not present

## 2017-04-04 DIAGNOSIS — E785 Hyperlipidemia, unspecified: Secondary | ICD-10-CM | POA: Diagnosis not present

## 2017-04-04 DIAGNOSIS — E1165 Type 2 diabetes mellitus with hyperglycemia: Secondary | ICD-10-CM | POA: Diagnosis not present

## 2017-04-04 DIAGNOSIS — Z1389 Encounter for screening for other disorder: Secondary | ICD-10-CM | POA: Diagnosis not present

## 2017-04-04 DIAGNOSIS — Z6827 Body mass index (BMI) 27.0-27.9, adult: Secondary | ICD-10-CM | POA: Diagnosis not present

## 2017-04-04 DIAGNOSIS — I10 Essential (primary) hypertension: Secondary | ICD-10-CM | POA: Diagnosis not present

## 2017-04-04 DIAGNOSIS — E663 Overweight: Secondary | ICD-10-CM | POA: Diagnosis not present

## 2017-06-11 ENCOUNTER — Encounter: Payer: Self-pay | Admitting: Gastroenterology

## 2017-09-19 DIAGNOSIS — Z6827 Body mass index (BMI) 27.0-27.9, adult: Secondary | ICD-10-CM | POA: Diagnosis not present

## 2017-09-19 DIAGNOSIS — E782 Mixed hyperlipidemia: Secondary | ICD-10-CM | POA: Diagnosis not present

## 2017-09-19 DIAGNOSIS — E785 Hyperlipidemia, unspecified: Secondary | ICD-10-CM | POA: Diagnosis not present

## 2017-09-19 DIAGNOSIS — E663 Overweight: Secondary | ICD-10-CM | POA: Diagnosis not present

## 2017-09-19 DIAGNOSIS — E1165 Type 2 diabetes mellitus with hyperglycemia: Secondary | ICD-10-CM | POA: Diagnosis not present

## 2017-09-19 DIAGNOSIS — I10 Essential (primary) hypertension: Secondary | ICD-10-CM | POA: Diagnosis not present

## 2017-09-19 DIAGNOSIS — Z0001 Encounter for general adult medical examination with abnormal findings: Secondary | ICD-10-CM | POA: Diagnosis not present

## 2017-09-19 DIAGNOSIS — Z1389 Encounter for screening for other disorder: Secondary | ICD-10-CM | POA: Diagnosis not present

## 2018-10-24 DIAGNOSIS — E663 Overweight: Secondary | ICD-10-CM | POA: Diagnosis not present

## 2018-10-24 DIAGNOSIS — E7849 Other hyperlipidemia: Secondary | ICD-10-CM | POA: Diagnosis not present

## 2018-10-24 DIAGNOSIS — I10 Essential (primary) hypertension: Secondary | ICD-10-CM | POA: Diagnosis not present

## 2018-10-24 DIAGNOSIS — Z1389 Encounter for screening for other disorder: Secondary | ICD-10-CM | POA: Diagnosis not present

## 2018-10-24 DIAGNOSIS — Z6826 Body mass index (BMI) 26.0-26.9, adult: Secondary | ICD-10-CM | POA: Diagnosis not present

## 2018-10-24 DIAGNOSIS — E119 Type 2 diabetes mellitus without complications: Secondary | ICD-10-CM | POA: Diagnosis not present

## 2018-10-24 DIAGNOSIS — E1165 Type 2 diabetes mellitus with hyperglycemia: Secondary | ICD-10-CM | POA: Diagnosis not present

## 2018-11-25 ENCOUNTER — Emergency Department (HOSPITAL_COMMUNITY): Payer: BC Managed Care – PPO

## 2018-11-25 ENCOUNTER — Other Ambulatory Visit: Payer: Self-pay

## 2018-11-25 ENCOUNTER — Emergency Department (HOSPITAL_COMMUNITY)
Admission: EM | Admit: 2018-11-25 | Discharge: 2018-11-25 | Disposition: A | Discharge status: Home / Self Care | Payer: BC Managed Care – PPO | Attending: Emergency Medicine | Admitting: Emergency Medicine

## 2018-11-25 ENCOUNTER — Encounter (HOSPITAL_COMMUNITY): Payer: Self-pay | Admitting: Emergency Medicine

## 2018-11-25 DIAGNOSIS — Z87891 Personal history of nicotine dependence: Secondary | ICD-10-CM | POA: Insufficient documentation

## 2018-11-25 DIAGNOSIS — F12988 Cannabis use, unspecified with other cannabis-induced disorder: Secondary | ICD-10-CM | POA: Insufficient documentation

## 2018-11-25 DIAGNOSIS — R112 Nausea with vomiting, unspecified: Secondary | ICD-10-CM | POA: Diagnosis present

## 2018-11-25 DIAGNOSIS — Z7982 Long term (current) use of aspirin: Secondary | ICD-10-CM | POA: Diagnosis not present

## 2018-11-25 DIAGNOSIS — I10 Essential (primary) hypertension: Secondary | ICD-10-CM | POA: Insufficient documentation

## 2018-11-25 DIAGNOSIS — E119 Type 2 diabetes mellitus without complications: Secondary | ICD-10-CM | POA: Insufficient documentation

## 2018-11-25 DIAGNOSIS — R531 Weakness: Secondary | ICD-10-CM | POA: Diagnosis not present

## 2018-11-25 DIAGNOSIS — F1290 Cannabis use, unspecified, uncomplicated: Secondary | ICD-10-CM

## 2018-11-25 DIAGNOSIS — Z79899 Other long term (current) drug therapy: Secondary | ICD-10-CM | POA: Insufficient documentation

## 2018-11-25 DIAGNOSIS — R9431 Abnormal electrocardiogram [ECG] [EKG]: Secondary | ICD-10-CM | POA: Diagnosis not present

## 2018-11-25 LAB — CBC WITH DIFFERENTIAL/PLATELET
Abs Immature Granulocytes: 0.03 10*3/uL (ref 0.00–0.07)
Basophils Absolute: 0.1 10*3/uL (ref 0.0–0.1)
Basophils Relative: 1 %
Eosinophils Absolute: 0 10*3/uL (ref 0.0–0.5)
Eosinophils Relative: 0 %
HCT: 50.3 % (ref 39.0–52.0)
Hemoglobin: 17.5 g/dL — ABNORMAL HIGH (ref 13.0–17.0)
Immature Granulocytes: 0 %
Lymphocytes Relative: 22 %
Lymphs Abs: 2.5 10*3/uL (ref 0.7–4.0)
MCH: 29.9 pg (ref 26.0–34.0)
MCHC: 34.8 g/dL (ref 30.0–36.0)
MCV: 86 fL (ref 80.0–100.0)
Monocytes Absolute: 0.6 10*3/uL (ref 0.1–1.0)
Monocytes Relative: 5 %
Neutro Abs: 8.2 10*3/uL — ABNORMAL HIGH (ref 1.7–7.7)
Neutrophils Relative %: 72 %
Platelets: 257 10*3/uL (ref 150–400)
RBC: 5.85 MIL/uL — ABNORMAL HIGH (ref 4.22–5.81)
RDW: 13.4 % (ref 11.5–15.5)
WBC: 11.4 10*3/uL — ABNORMAL HIGH (ref 4.0–10.5)
nRBC: 0 % (ref 0.0–0.2)

## 2018-11-25 LAB — PROTIME-INR
INR: 1 (ref 0.8–1.2)
Prothrombin Time: 13 seconds (ref 11.4–15.2)

## 2018-11-25 LAB — COMPREHENSIVE METABOLIC PANEL
ALT: 25 U/L (ref 0–44)
AST: 29 U/L (ref 15–41)
Albumin: 5.1 g/dL — ABNORMAL HIGH (ref 3.5–5.0)
Alkaline Phosphatase: 58 U/L (ref 38–126)
Anion gap: 14 (ref 5–15)
BUN: 14 mg/dL (ref 8–23)
CO2: 21 mmol/L — ABNORMAL LOW (ref 22–32)
Calcium: 9.8 mg/dL (ref 8.9–10.3)
Chloride: 104 mmol/L (ref 98–111)
Creatinine, Ser: 0.91 mg/dL (ref 0.61–1.24)
GFR calc Af Amer: >60 mL/min (ref >60)
GFR calc non Af Amer: >60 mL/min (ref >60)
Glucose, Bld: 161 mg/dL — ABNORMAL HIGH (ref 70–99)
Potassium: 3.2 mmol/L — ABNORMAL LOW (ref 3.5–5.1)
Sodium: 139 mmol/L (ref 135–145)
Total Bilirubin: 1.1 mg/dL (ref 0.3–1.2)
Total Protein: 8.7 g/dL — ABNORMAL HIGH (ref 6.5–8.1)

## 2018-11-25 LAB — LIPASE, BLOOD: Lipase: 20 U/L (ref 11–51)

## 2018-11-25 LAB — CBG MONITORING, ED: Glucose-Capillary: 138 mg/dL — ABNORMAL HIGH (ref 70–99)

## 2018-11-25 LAB — ETHANOL: Alcohol, Ethyl (B): <10 mg/dL (ref <10)

## 2018-11-25 LAB — SAMPLE TO BLOOD BANK

## 2018-11-25 MED ORDER — SODIUM CHLORIDE 0.9 % IV BOLUS
1000.0000 mL | Freq: Once | INTRAVENOUS | Status: AC
  Administered 2018-11-25: 1000 mL via INTRAVENOUS

## 2018-11-25 MED ORDER — HALOPERIDOL LACTATE 5 MG/ML IJ SOLN
2.0000 mg | Freq: Once | INTRAMUSCULAR | Status: AC
  Administered 2018-11-25: 2 mg via INTRAVENOUS
  Filled 2018-11-25: qty 1

## 2018-11-25 MED ORDER — PANTOPRAZOLE SODIUM 20 MG PO TBEC: 20.0000 mg | DELAYED_RELEASE_TABLET | Freq: Every day | ORAL | 0 refills | Status: DC

## 2018-11-25 MED ORDER — FAMOTIDINE IN NACL 20-0.9 MG/50ML-% IV SOLN
20.0000 mg | Freq: Once | INTRAVENOUS | Status: AC
  Administered 2018-11-25: 20 mg via INTRAVENOUS
  Filled 2018-11-25: qty 50

## 2018-11-25 MED ORDER — PANTOPRAZOLE SODIUM 40 MG IV SOLR: 40.0000 mg | Freq: Once | INTRAVENOUS | Status: DC

## 2018-11-25 MED ORDER — PANTOPRAZOLE SODIUM 40 MG PO TBEC
40.0000 mg | DELAYED_RELEASE_TABLET | Freq: Once | ORAL | Status: AC
  Administered 2018-11-25: 20:00:00 40 mg via ORAL
  Filled 2018-11-25: qty 1

## 2018-11-25 MED ORDER — ONDANSETRON HCL 4 MG/2ML IJ SOLN
4.0000 mg | Freq: Once | INTRAMUSCULAR | Status: AC
  Administered 2018-11-25: 4 mg via INTRAVENOUS
  Filled 2018-11-25: qty 2

## 2018-11-25 NOTE — Discharge Instructions (Addendum)
Vomiting is caused by marijuana. Stop using marijuana to prevent future episodes of severe vomiting that can lean to a life threatening tear in your esophagus (throat).  Take Protonix daily for help with irritation of the lining of the stomach.  Follow up with GI, referral given for further work up.

## 2018-11-25 NOTE — ED Provider Notes (Signed)
Medical screening examination/treatment/procedure(s) were conducted as a shared visit with non-physician practitioner(s) and myself.  I personally evaluated the patient during the encounter.    63yM with n/v. On my initial assessment he was laying on his side retching. Diaphoretic. Appeared very uncomfortable. Denies abdominal pain. Abdominal exam seems reassuring the best I can tell in his current state. Son at bedside. Says he has seen his father like this on several prior occasions. Prior evaluations for cannabis hyperemesis syndrome. Will obtain labs. Symptomatic tx. Will reassess once more comfortable. May obtain imaging depending on impression on repeat exams and also results of other testing.   Now feeling much better. Probably cannabis hyperemesis. I doubt acute surgical process.    Virgel Manifold, MD 11/27/18 [phone]

## 2018-11-25 NOTE — ED Triage Notes (Addendum)
Patient complaining of vomiting and nausea starting this morning. Patient diaphoretic at triage.

## 2018-11-25 NOTE — ED Provider Notes (Signed)
[facility] EMERGENCY DEPARTMENT Provider Note   CSN: [id number] Arrival date & time: 11/25/18  1540    History   Chief Complaint Chief Complaint  Patient presents with  . Emesis    HPI Travis Sharp is a 63 y.o. male.     63yo male presents with complaint of nausea and vomiting, actively retching in the room, diaphoretic, unable to answer many questions. States history of colitis. Stools have been black for unknown amount of time. Patient's son is at bedside, states this has happened in the past, caused by marijuana, reports similar bloody emesis with prior episode.      Past Medical History:  Diagnosis Date  . Colitis 03/23/2015  . Diabetes mellitus   . Gastritis   . Hypertension     Patient Active Problem List   Diagnosis Date Noted  . Constipation 10/03/2015  . Nausea with vomiting 10/03/2015  . Loss of weight 07/15/2015  . Intractable nausea and vomiting 05/18/2015  . Dysphagia 04/26/2015  . GERD (gastroesophageal reflux disease) 04/26/2015  . Hypokalemia 03/25/2015  . Type 2 diabetes mellitus with vascular disease ([place]) 03/24/2015  . Colitis 03/23/2015  . Nausea and vomiting 03/23/2015  . Essential hypertension 03/23/2015  . RECTAL BLEEDING 09/29/2008    Past Surgical History:  Procedure Laterality Date  . BIOPSY  05/10/2015   Procedure: BIOPSY;  Surgeon: Danie Binder, MD;  [facility];  Service: Endoscopy;;  gastric bx's  . COLONOSCOPY WITH PROPOFOL N/A 05/10/2015   Procedure: COLONOSCOPY WITH PROPOFOL;  Surgeon: Danie Binder, MD;  [facility];  Service: Endoscopy;  Laterality: N/A;  0730  . ESOPHAGOGASTRODUODENOSCOPY (EGD) WITH PROPOFOL N/A 05/10/2015   Procedure: ESOPHAGOGASTRODUODENOSCOPY (EGD) WITH PROPOFOL;  Surgeon: Danie Binder, MD;  [facility];  Service: Endoscopy;  Laterality: N/A;  . SAVORY DILATION N/A 05/10/2015   Procedure: SAVORY DILATION;  Surgeon: Danie Binder, MD;  [facility];   Service: Endoscopy;  Laterality: N/A;  . TONSILLECTOMY          Home Medications    Prior to Admission medications   Medication Sig Start Date End Date Taking? Authorizing Provider  aspirin 81 MG tablet Take 81 mg by mouth daily.    [provider]  BENICAR HCT 40-25 MG tablet Take 1 tablet by mouth daily. RESTART IN 2 DAYS. Patient taking differently: Take 1 tablet by mouth daily.  03/27/15   Rexene Alberts, MD  dexlansoprazole (DEXILANT) 60 MG capsule Take 1 capsule (60 mg total) by mouth daily. 10/13/15   Carlis Stable, NP  linaclotide Rolan Lipa) 145 MCG CAPS capsule Take 1 capsule (145 mcg total) by mouth daily before breakfast. 12/30/15   Carlis Stable, NP  pantoprazole (PROTONIX) 20 MG tablet Take 1 tablet (20 mg total) by mouth daily. 11/25/18   Tacy Learn, PA-C  TRESIBA FLEXTOUCH 200 UNIT/ML SOPN Inject 30 Units into the skin at bedtime. 11/16/16   [provider]    Family History Family History  Problem Relation Age of Onset  . Colon cancer Other        possibly 2 uncles  . Stroke Mother   . Aneurysm Mother   . Kidney failure Brother   . Heart disease Father     Social History Social History   Tobacco Use  . Smoking status: Former Smoker    Packs/day: 1.00    Years: 28.00    Pack years: 28.00    Quit  date: 05/04/2005    Years since quitting: 13.5  . Smokeless tobacco: Never Used  Substance Use Topics  . Alcohol use: No    Alcohol/week: 0.0 standard drinks  . Drug use: Yes    Types: Marijuana     Allergies   Patient has no known allergies.   Review of Systems Review of Systems  Unable to perform ROS: Acuity of condition  Constitutional: Positive for chills and diaphoresis.  Gastrointestinal: Positive for nausea and vomiting.     Physical Exam Updated Vital Signs BP (!) 161/84   Pulse 75   Temp (!) 97.5 F (36.4 C) (Temporal)   Resp 17   Ht 5\' 10"  (1.778 m)   Wt 83 kg   SpO2 99%   BMI 26.26 kg/m   Physical Exam Vitals  signs and nursing note reviewed.  Constitutional:      Appearance: He is well-developed. He is ill-appearing and diaphoretic.     Comments: Brown tinged sputum/emesis   HENT:     Head: Normocephalic and atraumatic.  Cardiovascular:     Rate and Rhythm: Normal rate and regular rhythm.     Heart sounds: Normal heart sounds.  Pulmonary:     Effort: Pulmonary effort is normal.     Breath sounds: Normal breath sounds.  Abdominal:     Palpations: Abdomen is soft.  Skin:    Coloration: Skin is pale.  Neurological:     Mental Status: He is alert and oriented to person, place, and time.  Psychiatric:        Behavior: Behavior normal.      ED Treatments / Results  Labs (all labs ordered are listed, but only abnormal results are displayed) Labs Reviewed  CBC WITH DIFFERENTIAL/PLATELET - Abnormal; Notable for the following components:      Result Value   WBC 11.4 (*)    RBC 5.85 (*)    Hemoglobin 17.5 (*)    Neutro Abs 8.2 (*)    All other components within normal limits  COMPREHENSIVE METABOLIC PANEL - Abnormal; Notable for the following components:   Potassium 3.2 (*)    CO2 21 (*)    Glucose, Bld 161 (*)    Total Protein 8.7 (*)    Albumin 5.1 (*)    All other components within normal limits  CBG MONITORING, ED - Abnormal; Notable for the following components:   Glucose-Capillary 138 (*)    All other components within normal limits  LIPASE, BLOOD  PROTIME-INR  ETHANOL  URINALYSIS, ROUTINE W REFLEX MICROSCOPIC  RAPID URINE DRUG SCREEN, HOSP PERFORMED  PH, GASTRIC FLUID (GASTROCCULT CARD)  POC OCCULT BLOOD, ED  SAMPLE TO BLOOD BANK    EKG None  Radiology Dg Chest Port 1 View  Result Date: 11/25/2018 CLINICAL DATA:  Weakness EXAM: PORTABLE CHEST 1 VIEW COMPARISON:  03/23/2015 FINDINGS: The heart size and mediastinal contours are within normal limits. Both lungs are clear. The visualized skeletal structures are unremarkable. IMPRESSION: No active disease.  Electronically Signed   By: Inez Catalina M.D.   On: 11/25/2018 17:19    Procedures .Critical Care Performed by: Tacy Learn, PA-C Authorized by: Tacy Learn, PA-C   Critical care provider statement:    Critical care time (minutes):  45   Critical care was time spent personally by me on the following activities:  Discussions with consultants, evaluation of patient's response to treatment, examination of patient, ordering and performing treatments and interventions, ordering and review of laboratory studies, ordering and  review of radiographic studies, pulse oximetry, re-evaluation of patient's condition, obtaining history from patient or surrogate and review of old charts   (including critical care time)  Medications Ordered in ED Medications  pantoprazole (PROTONIX) EC tablet 40 mg (has no administration in time range)  sodium chloride 0.9 % bolus 1,000 mL (0 mLs Intravenous Stopped 11/25/18 1939)  ondansetron (ZOFRAN) injection 4 mg (4 mg Intravenous Given 11/25/18 1620)  haloperidol lactate (HALDOL) injection 2 mg (2 mg Intravenous Given 11/25/18 1630)  famotidine (PEPCID) IVPB 20 mg premix (0 mg Intravenous Stopped 11/25/18 1939)     Initial Impression / Assessment and Plan / ED Course  I have reviewed the triage vital signs and the nursing notes.  Pertinent labs & imaging results that were available during my care of the patient were reviewed by me and considered in my medical decision making (see chart for details).  Clinical Course as of Nov 24 1948  Tue Nov 25, 2018  1945 63yo male brought in by son from home for severe nausea and vomiting onset this morning. On exam, patient ill appearing, diaphoretic, retching, brown emesis. History provided by son due to patient's acuity, states this has happened several times, thought to be due to smoking marijuana. Patient denies alcohol use or drug use other than marijuana. Abdomen is soft and non tender. Patient seen by Dr. Wilson Singer as  well, given Haldol with relief of his vomiting. Labs are reassuring, CBC with hgb 17.5, INR WNL, etoh negative, lipase WNL, CMP with mild hypokalemia 3.2, normal anion gap. CBG 138. Vomiting resolved, patient tolerating PO fluids, given Protonix. Discussed importance of stopping marijuana, follow up with GI for possible GI bleed, hemodynamically stable for dc. Pt agreeable with plan, given referral to GI, return to ER for worsening or concerning symptoms.    [LM]    Clinical Course User Index [LM] Tacy Learn, PA-C      Final Clinical Impressions(s) / ED Diagnoses   Final diagnoses:  Cannabinoid hyperemesis syndrome [facility])    ED Discharge Orders         Ordered    pantoprazole (PROTONIX) 20 MG tablet  Daily     11/25/18 1930           Roque Lias 11/25/18 1950    Virgel Manifold, MD 11/27/18 [phone]

## 2018-11-27 ENCOUNTER — Other Ambulatory Visit: Payer: Self-pay | Admitting: Internal Medicine

## 2018-11-27 DIAGNOSIS — Z20822 Contact with and (suspected) exposure to covid-19: Secondary | ICD-10-CM

## 2018-11-28 ENCOUNTER — Ambulatory Visit (INDEPENDENT_AMBULATORY_CARE_PROVIDER_SITE_OTHER): Payer: BC Managed Care – PPO | Admitting: Gastroenterology

## 2018-11-28 ENCOUNTER — Other Ambulatory Visit: Payer: Self-pay

## 2018-11-28 ENCOUNTER — Encounter: Payer: Self-pay | Admitting: Gastroenterology

## 2018-11-28 VITALS — BP 145/99 | HR 111 | Temp 96.8°F | Ht 70.0 in | Wt 170.6 lb

## 2018-11-28 DIAGNOSIS — K5900 Constipation, unspecified: Secondary | ICD-10-CM | POA: Diagnosis not present

## 2018-11-28 DIAGNOSIS — R112 Nausea with vomiting, unspecified: Secondary | ICD-10-CM | POA: Diagnosis not present

## 2018-11-28 MED ORDER — LINACLOTIDE 290 MCG PO CAPS: 290.0000 ug | ORAL_CAPSULE | Freq: Every day | ORAL | 5 refills | Status: DC

## 2018-11-28 MED ORDER — ONDANSETRON HCL 4 MG PO TABS: 4.0000 mg | ORAL_TABLET | Freq: Three times a day (TID) | ORAL | 0 refills | Status: DC | PRN

## 2018-11-28 NOTE — Patient Instructions (Signed)
1. Increase pantoprazole to 40mg  (two tablets) once daily. 2. Zofran 4mg  every 6-8 hours as needed for nausea. RX sent to pharmacy.  3. Increase Linzess to 277mcg daily for constipation. RX sent to pharmacy. 4. If you have further vomiting of black stuff, black stools, feel lightheaded, go to the ER.  5. You should stay at home and quarantine until you get your COVID test results back.  6. Call Monday and let me know how you are doing.

## 2018-11-28 NOTE — Progress Notes (Signed)
Primary Care Physician:  [facility] Primary GI:  Barney Drain, MD   Patient Location: Home  Provider Location: [place] office  Reason for Phone Visit: constipation, N/V  Persons present on the phone encounter, with roles: Patient, myself (provider),Martina Darrick Grinder, LPN(updated meds and allergies)  Total time (minutes) spent on medical discussion: 20 minutes  Due to COVID-Travis, visit was conducted using telephonic method (no video was available).  Visit was requested by patient.  Virtual Visit via Telephone only  I connected with Mr. Intriago on 11/28/18 at 11:00 AM EDT by telephone and verified that I am speaking with the correct person using two identifiers.   I discussed the limitations, risks, security and privacy concerns of performing an evaluation and management service by telephone and the availability of in person appointments. I also discussed with the patient that there may be a patient responsible charge related to this service. The patient expressed understanding and agreed to proceed.  Chief Complaint  Patient presents with  . Emesis  . Fatigue  . Constipation    last bm approx 4 days ago    HPI:   Patient is a pleasant 63 y/o Sharp who presents for telephone visit regarding N/V, constipation.  Patient last seen in April 2018.  He has a history of recurrent nausea and vomiting and constipation.  History of marijuana and possible cannabis hyperemesis syndrome.  EGD and colonoscopy in January 2017.  Dysphagia due to stricture at GE junction status post dilation, mild nonerosive gastritis.  Mild diverticulosis of the colon, hemorrhoids.  Next colonoscopy January 2027.  Patient states he was doing fine, started vomiting 3 days ago.  Black emesis.  Last bowel movement 3 days ago, was blacksish liquid colored stool.  No red blood per rectum.  Had to take some Linzess that he had leftover because of constipation.  No recent Pepto-Bismol use.  Some pain towards  the upper left abdomen.  Seen in the emergency department on August 11.  Chest x-ray unremarkable.  White blood cell count 11,400, hemoglobin 17.5, potassium 3.2, glucose 161.  While in the ED he was noted to have brown-tinged emesis.  He was started on pantoprazole at discharge.  Patient denies NSAIDs.  Takes aspirin 81 mg daily.  States he has been concerned about several coworkers with [employer].  He went for testing yesterday.  Has been asymptomatic.    Current Outpatient Medications  Medication Sig Dispense Refill  . aspirin 81 MG tablet Take 81 mg by mouth daily.    Marland Kitchen BENICAR HCT 40-25 MG tablet Take 1 tablet by mouth daily. RESTART IN 2 DAYS. (Patient taking differently: Take 1 tablet by mouth daily. )    . linaclotide (LINZESS) 145 MCG CAPS capsule Take 1 capsule (145 mcg total) by mouth daily before breakfast. 30 capsule 2  . pantoprazole (PROTONIX) 20 MG tablet Take 1 tablet (20 mg total) by mouth daily. 30 tablet 0  . TRESIBA FLEXTOUCH 200 UNIT/ML SOPN Inject 30 Units into the skin at bedtime.  0   No current facility-administered medications for this visit.     Past Medical History:  Diagnosis Date  . Colitis 03/23/2015  . Diabetes mellitus   . Gastritis   . Hypertension     Past Surgical History:  Procedure Laterality Date  . BIOPSY  05/10/2015   Procedure: BIOPSY;  Surgeon: Danie Binder, MD;  [facility];  Service: Endoscopy;;  gastric bx's  . COLONOSCOPY WITH PROPOFOL  N/A 05/10/2015   dr. Oneida Alar: Mild diverticulosis, hemorrhoids.  Next colonoscopy 10 years  . ESOPHAGOGASTRODUODENOSCOPY (EGD) WITH PROPOFOL N/A 05/10/2015   Dr. Oneida Alar: dysphagai due to stricture at [facility] s/p dilation, mild nonerosive gastritis.   Marland Kitchen SAVORY DILATION N/A 05/10/2015   Procedure: SAVORY DILATION;  Surgeon: Danie Binder, MD;  [facility];  Service: Endoscopy;  Laterality: N/A;  . TONSILLECTOMY      Family History  Problem Relation Age of Onset  . Colon cancer Other         possibly 2 uncles  . Stroke Mother   . Aneurysm Mother   . Kidney failure Brother   . Heart disease Father     Social History   Socioeconomic History  . Marital status: Married    Spouse name: Not on file  . Number of children: Not on file  . Years of education: Not on file  . Highest education level: Not on file  Occupational History  . Occupation: [employer]  Social Needs  . Financial resource strain: Not on file  . Food insecurity    Worry: Not on file    Inability: Not on file  . Transportation needs    Medical: Not on file    Non-medical: Not on file  Tobacco Use  . Smoking status: Former Smoker    Packs/day: 1.00    Years: 28.00    Pack years: 28.00    Quit date: 1/Travis/2007    Years since quitting: 13.5  . Smokeless tobacco: Never Used  Substance and Sexual Activity  . Alcohol use: Yes    Alcohol/week: 0.0 standard drinks    Comment: very seldom  . Drug use: Yes    Types: Marijuana  . Sexual activity: Not on file  Lifestyle  . Physical activity    Days per week: Not on file    Minutes per session: Not on file  . Stress: Not on file  Relationships  . Social Herbalist on phone: Not on file    Gets together: Not on file    Attends religious service: Not on file    Active member of club or organization: Not on file    Attends meetings of clubs or organizations: Not on file    Relationship status: Not on file  . Intimate partner violence    Fear of current or ex partner: Not on file    Emotionally abused: Not on file    Physically abused: Not on file    Forced sexual activity: Not on file  Other Topics Concern  . Not on file  Social History Narrative  . Not on file      ROS:  General: Negative for anorexia, weight loss, fever, chills, fatigue, weakness. Eyes: Negative for vision changes.  ENT: Negative for hoarseness, difficulty swallowing , nasal congestion. CV: Negative for chest pain, angina, palpitations, dyspnea on exertion,  peripheral edema.  Respiratory: Negative for dyspnea at rest, dyspnea on exertion, cough, sputum, wheezing.  GI: See history of present illness. GU:  Negative for dysuria, hematuria, urinary incontinence, urinary frequency, nocturnal urination.  MS: Negative for joint pain, low back pain.  Derm: Negative for rash or itching.  Neuro: Negative for weakness, abnormal sensation, seizure, frequent headaches, memory loss, confusion.  Psych: Negative for anxiety, depression, suicidal ideation, hallucinations.  Endo: Negative for unusual weight change.  Heme: Negative for bruising or bleeding. Allergy: Negative for rash or hives.   Observations/Objective: Exam not available  over phone. No apparent sob. Calm. Pleasant.   Lab Results  Component Value Date   CREATININE 0.91 11/25/2018   BUN 14 11/25/2018   NA 139 11/25/2018   K 3.2 (L) 11/25/2018   CL 104 11/25/2018   CO2 21 (L) 11/25/2018   Lab Results  Component Value Date   ALT 25 11/25/2018   AST 29 11/25/2018   ALKPHOS 58 11/25/2018   BILITOT 1.1 11/25/2018   Lab Results  Component Value Date   WBC 11.4 (H) 11/25/2018   HGB 17.5 (H) 11/25/2018   HCT 50.3 11/25/2018   MCV 86.0 11/25/2018   PLT 257 11/25/2018   Lab Results  Component Value Date   LIPASE 20 11/25/2018   Lab Results  Component Value Date   INR 1.0 11/25/2018   Alcohol <10  Assessment and Plan: Pleasant 63 year old gentleman presenting for recent onset of nausea and vomiting with reported coffee-ground emesis.  Questionable black stool in the setting of constipation.  Has been able to keep things down, urinating well for the past couple of days.  No BM in several days.  Acute onset nausea and vomiting of unclear etiology.  Cannot exclude gastroenteritis, can evidence hyperemesis syndrome, gastritis.  Doubt significant upper GI bleed.  Monitor for further melena.  Agree with pantoprazole however he was provided 20 mg tablets, would advise taking 40 mg daily.  We  will also add Linzess 290 mcg daily (158mcg not sufficientyly effective) for constipation.  Add Zofran 4 mg every 6-8 hours as needed for nausea.  If further vomiting of black emesis, black stools, feels lightheaded he should go to the ED.  Call Monday with a progress report.  Follow Up Instructions:    I discussed the assessment and treatment plan with the patient. The patient was provided an opportunity to ask questions and all were answered. The patient agreed with the plan and demonstrated an understanding of the instructions. AVS mailed to patient's home address.   The patient was advised to call back or seek an in-person evaluation if the symptoms worsen or if the condition fails to improve as anticipated.  I provided 20 minutes of non-face-to-face time during this encounter.   Neil Crouch, PA-C

## 2018-11-29 LAB — NOVEL CORONAVIRUS, NAA: SARS-CoV-2, NAA: NOT DETECTED

## 2018-12-01 ENCOUNTER — Telehealth: Payer: Self-pay

## 2018-12-01 ENCOUNTER — Telehealth: Payer: Self-pay | Admitting: *Deleted

## 2018-12-01 NOTE — Telephone Encounter (Signed)
[facility] he is feeling better. Please let him know if he has any more "black" stools to please call and we will check another CBC at that time.   Continue pantoprazole 40mg  daily for at least 6 weeks. Let me know if he needs RX.

## 2018-12-01 NOTE — Telephone Encounter (Signed)
Pt called to let DS know that his Linzess RX isn't covered under insurance and will require a PA.

## 2018-12-01 NOTE — Telephone Encounter (Signed)
Pt says he was supposed to call today to let us know how he is doing. Pt said nausea has stopped.  Pt has used the bathroom and everything seems ok to him.  He says he feels much better.  [phone]

## 2018-12-01 NOTE — Telephone Encounter (Signed)
LMOM to call.

## 2018-12-01 NOTE — Telephone Encounter (Signed)
Forwarding to Leslie Lewis, PA.  

## 2018-12-02 NOTE — Telephone Encounter (Signed)
LMOM to call.

## 2018-12-03 NOTE — Telephone Encounter (Signed)
Pt said he has not tried any OTC medications for constipation. He has only tried Linzess 145 mcg. He is aware I will be working on PA for the Linzess 290 mcg.

## 2018-12-03 NOTE — Telephone Encounter (Signed)
PT is aware and will let us know if has any more problems.

## 2018-12-04 NOTE — Telephone Encounter (Signed)
I called pt's insurance and they will fax paperwork to do PA for the Linzess 290 mcg.

## 2018-12-11 ENCOUNTER — Telehealth: Payer: Self-pay

## 2018-12-11 NOTE — Telephone Encounter (Addendum)
Working on [employer] for [employer] 290 mcg.   Pt said he has never tried any OTC meds for constipation, even before he tried the Linzess 145 mcg.  Insurance requires him to try OTC.  Magda Paganini, please advise!

## 2018-12-15 NOTE — Telephone Encounter (Signed)
Ok. Let patient know we need to try otc med for constipation before we can get linzess approved.   miralax 17 grams orally twice daily until soft BM, then once daily as needed. Buy OTC. Can use generic store brand.   If ineffective, give [place] a call and we can resubmit P.A.

## 2018-12-16 NOTE — Telephone Encounter (Signed)
PT is aware.

## 2019-04-23 ENCOUNTER — Ambulatory Visit: Payer: BC Managed Care – PPO | Attending: Internal Medicine

## 2019-04-23 ENCOUNTER — Other Ambulatory Visit: Payer: Self-pay

## 2019-04-23 DIAGNOSIS — Z20822 Contact with and (suspected) exposure to covid-19: Secondary | ICD-10-CM

## 2019-04-24 LAB — NOVEL CORONAVIRUS, NAA: SARS-CoV-2, NAA: DETECTED — AB

## 2019-04-25 ENCOUNTER — Telehealth: Payer: Self-pay | Admitting: Infectious Diseases

## 2019-04-25 NOTE — Telephone Encounter (Signed)
Called to discuss with patient about Covid symptoms and the use of bamlanivimab, a monoclonal antibody infusion for those with mild to moderate Covid symptoms and at a high risk of hospitalization.  Pt is qualified for this infusion at the [facility] infusion center due to Diabetes.    Message left to call back and MyChart message sent. Will need to clairify symptom onset.

## 2019-04-27 DIAGNOSIS — U071 COVID-19: Secondary | ICD-10-CM | POA: Diagnosis not present

## 2019-06-16 ENCOUNTER — Ambulatory Visit: Payer: BC Managed Care – PPO | Attending: Internal Medicine

## 2019-06-16 ENCOUNTER — Other Ambulatory Visit: Payer: Self-pay

## 2019-06-16 ENCOUNTER — Ambulatory Visit: Payer: BC Managed Care – PPO

## 2019-06-16 DIAGNOSIS — Z23 Encounter for immunization: Secondary | ICD-10-CM | POA: Insufficient documentation

## 2019-06-16 NOTE — Progress Notes (Signed)
   Covid-19 Vaccination Clinic  Name:  Travis Sharp    MRN: [id number] DOB: December 01, 1955  06/16/2019  Travis Sharp was observed post Covid-19 immunization for 15 minutes without incident. He was provided with Vaccine Information Sheet and instruction to access the V-Safe system.   Travis Sharp was instructed to call 911 with any severe reactions post vaccine: Marland Kitchen Difficulty breathing  . Swelling of face and throat  . A fast heartbeat  . A bad rash all over body  . Dizziness and weakness   Immunizations Administered    Name Date Dose VIS Date Route   Moderna COVID-19 Vaccine 06/16/2019  4:10 PM 0.5 mL 03/17/2019 Intramuscular   Manufacturer: Moderna   Lot: [id number]   [id number]

## 2019-07-14 ENCOUNTER — Ambulatory Visit: Payer: BC Managed Care – PPO | Attending: Internal Medicine

## 2019-07-14 DIAGNOSIS — Z23 Encounter for immunization: Secondary | ICD-10-CM

## 2019-07-14 NOTE — Progress Notes (Signed)
   Covid-19 Vaccination Clinic  Name:  Travis Sharp    MRN: [id number] DOB: Jan 05, 1956  07/14/2019  Mr. Conn was observed post Covid-19 immunization for 15 minutes without incident. He was provided with Vaccine Information Sheet and instruction to access the V-Safe system.   Mr. Vanderzee was instructed to call 911 with any severe reactions post vaccine: Marland Kitchen Difficulty breathing  . Swelling of face and throat  . A fast heartbeat  . A bad rash all over body  . Dizziness and weakness   Immunizations Administered    Name Date Dose VIS Date Route   Moderna COVID-19 Vaccine 07/14/2019 10:07 AM 0.5 mL 03/17/2019 Intramuscular   Manufacturer: Moderna   Lot: [id number]   [place]:[id number]

## 2019-10-12 ENCOUNTER — Other Ambulatory Visit: Payer: Self-pay

## 2019-10-12 ENCOUNTER — Emergency Department (HOSPITAL_COMMUNITY)
Admission: EM | Admit: 2019-10-12 | Discharge: 2019-10-12 | Disposition: A | Discharge status: Home / Self Care | Payer: BC Managed Care – PPO | Attending: Emergency Medicine | Admitting: Emergency Medicine

## 2019-10-12 ENCOUNTER — Emergency Department (HOSPITAL_COMMUNITY): Payer: BC Managed Care – PPO

## 2019-10-12 ENCOUNTER — Encounter (HOSPITAL_COMMUNITY): Payer: Self-pay | Admitting: *Deleted

## 2019-10-12 DIAGNOSIS — R42 Dizziness and giddiness: Secondary | ICD-10-CM | POA: Diagnosis not present

## 2019-10-12 DIAGNOSIS — Z20822 Contact with and (suspected) exposure to covid-19: Secondary | ICD-10-CM | POA: Insufficient documentation

## 2019-10-12 DIAGNOSIS — Z7982 Long term (current) use of aspirin: Secondary | ICD-10-CM | POA: Diagnosis not present

## 2019-10-12 DIAGNOSIS — E119 Type 2 diabetes mellitus without complications: Secondary | ICD-10-CM | POA: Insufficient documentation

## 2019-10-12 DIAGNOSIS — D751 Secondary polycythemia: Secondary | ICD-10-CM | POA: Diagnosis not present

## 2019-10-12 DIAGNOSIS — I10 Essential (primary) hypertension: Secondary | ICD-10-CM | POA: Diagnosis not present

## 2019-10-12 DIAGNOSIS — D45 Polycythemia vera: Secondary | ICD-10-CM | POA: Insufficient documentation

## 2019-10-12 DIAGNOSIS — K5900 Constipation, unspecified: Secondary | ICD-10-CM

## 2019-10-12 DIAGNOSIS — K6389 Other specified diseases of intestine: Secondary | ICD-10-CM | POA: Diagnosis not present

## 2019-10-12 DIAGNOSIS — K209 Esophagitis, unspecified without bleeding: Secondary | ICD-10-CM | POA: Insufficient documentation

## 2019-10-12 DIAGNOSIS — K228 Other specified diseases of esophagus: Secondary | ICD-10-CM | POA: Diagnosis not present

## 2019-10-12 DIAGNOSIS — Z87891 Personal history of nicotine dependence: Secondary | ICD-10-CM | POA: Diagnosis not present

## 2019-10-12 DIAGNOSIS — K3189 Other diseases of stomach and duodenum: Secondary | ICD-10-CM | POA: Diagnosis not present

## 2019-10-12 DIAGNOSIS — I700 Atherosclerosis of aorta: Secondary | ICD-10-CM | POA: Diagnosis not present

## 2019-10-12 LAB — TROPONIN I (HIGH SENSITIVITY)
Troponin I (High Sensitivity): 4 ng/L (ref <18)
Troponin I (High Sensitivity): 3 ng/L (ref <18)

## 2019-10-12 LAB — CBC WITH DIFFERENTIAL/PLATELET
Abs Immature Granulocytes: 0.05 10*3/uL (ref 0.00–0.07)
Basophils Absolute: 0.1 10*3/uL (ref 0.0–0.1)
Basophils Relative: 1 %
Eosinophils Absolute: 0.1 10*3/uL (ref 0.0–0.5)
Eosinophils Relative: 1 %
HCT: 54.1 % — ABNORMAL HIGH (ref 39.0–52.0)
Hemoglobin: 19 g/dL — ABNORMAL HIGH (ref 13.0–17.0)
Immature Granulocytes: 1 %
Lymphocytes Relative: 31 %
Lymphs Abs: 2.6 10*3/uL (ref 0.7–4.0)
MCH: 30 pg (ref 26.0–34.0)
MCHC: 35.1 g/dL (ref 30.0–36.0)
MCV: 85.5 fL (ref 80.0–100.0)
Monocytes Absolute: 0.7 10*3/uL (ref 0.1–1.0)
Monocytes Relative: 8 %
Neutro Abs: 4.9 10*3/uL (ref 1.7–7.7)
Neutrophils Relative %: 58 %
Platelets: 240 10*3/uL (ref 150–400)
RBC: 6.33 MIL/uL — ABNORMAL HIGH (ref 4.22–5.81)
RDW: 13 % (ref 11.5–15.5)
WBC: 8.3 10*3/uL (ref 4.0–10.5)
nRBC: 0 % (ref 0.0–0.2)

## 2019-10-12 LAB — COMPREHENSIVE METABOLIC PANEL
ALT: 25 U/L (ref 0–44)
AST: 19 U/L (ref 15–41)
Albumin: 4.4 g/dL (ref 3.5–5.0)
Alkaline Phosphatase: 70 U/L (ref 38–126)
Anion gap: 14 (ref 5–15)
BUN: 14 mg/dL (ref 8–23)
CO2: 23 mmol/L (ref 22–32)
Calcium: 9.4 mg/dL (ref 8.9–10.3)
Chloride: 96 mmol/L — ABNORMAL LOW (ref 98–111)
Creatinine, Ser: 0.87 mg/dL (ref 0.61–1.24)
GFR calc Af Amer: >60 mL/min (ref >60)
GFR calc non Af Amer: >60 mL/min (ref >60)
Glucose, Bld: 225 mg/dL — ABNORMAL HIGH (ref 70–99)
Potassium: 3.3 mmol/L — ABNORMAL LOW (ref 3.5–5.1)
Sodium: 133 mmol/L — ABNORMAL LOW (ref 135–145)
Total Bilirubin: 1.5 mg/dL — ABNORMAL HIGH (ref 0.3–1.2)
Total Protein: 8 g/dL (ref 6.5–8.1)

## 2019-10-12 LAB — URINALYSIS, ROUTINE W REFLEX MICROSCOPIC
Bacteria, UA: NONE SEEN
Bilirubin Urine: NEGATIVE
Glucose, UA: 50 mg/dL — AB
Hgb urine dipstick: NEGATIVE
Ketones, ur: 20 mg/dL — AB
Leukocytes,Ua: NEGATIVE
Nitrite: NEGATIVE
Protein, ur: 30 mg/dL — AB
Specific Gravity, Urine: 1.025 (ref 1.005–1.030)
pH: 5 (ref 5.0–8.0)

## 2019-10-12 LAB — SARS CORONAVIRUS 2 BY RT PCR (HOSPITAL ORDER, PERFORMED IN ~~LOC~~ HOSPITAL LAB): SARS Coronavirus 2: NEGATIVE

## 2019-10-12 LAB — CBG MONITORING, ED: Glucose-Capillary: 246 mg/dL — ABNORMAL HIGH (ref 70–99)

## 2019-10-12 MED ORDER — ONDANSETRON HCL 4 MG/2ML IJ SOLN
4.0000 mg | Freq: Once | INTRAMUSCULAR | Status: AC
  Administered 2019-10-12: 4 mg via INTRAVENOUS
  Filled 2019-10-12: qty 2

## 2019-10-12 MED ORDER — ONDANSETRON HCL 4 MG PO TABS: 4.0000 mg | ORAL_TABLET | Freq: Four times a day (QID) | ORAL | 0 refills | Status: DC

## 2019-10-12 MED ORDER — FAMOTIDINE IN NACL 20-0.9 MG/50ML-% IV SOLN
20.0000 mg | Freq: Once | INTRAVENOUS | Status: AC
  Administered 2019-10-12: 20 mg via INTRAVENOUS
  Filled 2019-10-12: qty 50

## 2019-10-12 MED ORDER — IOHEXOL 300 MG/ML  SOLN
100.0000 mL | Freq: Once | INTRAMUSCULAR | Status: AC | PRN
  Administered 2019-10-12: 100 mL via INTRAVENOUS

## 2019-10-12 MED ORDER — SODIUM CHLORIDE 0.9 % IV BOLUS
1000.0000 mL | Freq: Once | INTRAVENOUS | Status: AC
  Administered 2019-10-12: 1000 mL via INTRAVENOUS

## 2019-10-12 NOTE — ED Provider Notes (Addendum)
[facility] Provider Note   CSN: [id number] Arrival date & time: 10/12/19  1102     History Chief Complaint  Patient presents with  . Dizziness    Travis Sharp is a 64 y.o. male.  HPI 64 year old male with history of hypertension, DM type II, gastritis presents to the ER with dizziness, weakness, nausea and vomiting since Friday. Patient states that he was outside at a family's barbecue and started to have high blood pressure (though he cannot recall the exact reading)  nausea and vomiting with associated dizziness. He states that his dizziness and high blood pressure improved, however he has had difficulty holding on any food or water over the last few days. He has had nonbloody nonbilious vomiting. He overall feels weak and has some mild abdominal pain. He also states that he has some left-sided back pain, though he is not sure if this is from golfing several days ago. He also states that he has not had a bowel movement in approximately 4 days. Patient is followed by GI. He denies any alcohol or drug use. Denies any fevers or chills, chest pain or shortness of breath. No dysuria or hematuria.  No hematochezia.  He states that over the last month, he has had some weakness and dizziness especially when being outside. He has attributed this to "getting old". He has not taken anything for his symptoms.    Past Medical History:  Diagnosis Date  . Colitis 03/23/2015  . Diabetes mellitus   . Gastritis   . Hypertension     Patient Active Problem List   Diagnosis Date Noted  . Constipation 10/03/2015  . Nausea with vomiting 10/03/2015  . Loss of weight 07/15/2015  . Intractable nausea and vomiting 05/18/2015  . Dysphagia 04/26/2015  . GERD (gastroesophageal reflux disease) 04/26/2015  . Hypokalemia 03/25/2015  . Type 2 diabetes mellitus with vascular disease ([place]) 03/24/2015  . Colitis 03/23/2015  . Nausea and vomiting 03/23/2015  . Essential hypertension  03/23/2015  . RECTAL BLEEDING 09/29/2008    Past Surgical History:  Procedure Laterality Date  . BIOPSY  05/10/2015   Procedure: BIOPSY;  Surgeon: Danie Binder, MD;  Location: [facility];  Service: Endoscopy;;  gastric bx's  . COLONOSCOPY WITH PROPOFOL N/A 05/10/2015   dr. Oneida Alar: Mild diverticulosis, hemorrhoids.  Next colonoscopy 10 years  . ESOPHAGOGASTRODUODENOSCOPY (EGD) WITH PROPOFOL N/A 05/10/2015   Dr. Oneida Alar: dysphagai due to stricture at [facility] s/p dilation, mild nonerosive gastritis.   Marland Kitchen SAVORY DILATION N/A 05/10/2015   Procedure: SAVORY DILATION;  Surgeon: Danie Binder, MD;  Location: [facility];  Service: Endoscopy;  Laterality: N/A;  . TONSILLECTOMY         Family History  Problem Relation Age of Onset  . Colon cancer Other        possibly 2 uncles  . Stroke Mother   . Aneurysm Mother   . Kidney failure Brother   . Heart disease Father     Social History   Tobacco Use  . Smoking status: Former Smoker    Packs/day: 1.00    Years: 28.00    Pack years: 28.00    Quit date: 05/04/2005    Years since quitting: 14.4  . Smokeless tobacco: Never Used  Substance Use Topics  . Alcohol use: Yes    Alcohol/week: 0.0 standard drinks    Comment: very seldom  . Drug use: Yes    Types: Marijuana    Home Medications Prior  to Admission medications   Medication Sig Start Date End Date Taking? Authorizing Provider  aspirin 81 MG tablet Take 81 mg by mouth daily.   Yes [provider]  linaclotide Rolan Lipa) 290 MCG CAPS capsule Take 1 capsule (290 mcg total) by mouth daily before breakfast. 11/28/18  Yes Mahala Menghini, PA-C  TRESIBA FLEXTOUCH 200 UNIT/ML SOPN Inject 30 Units into the skin at bedtime. 11/16/16  Yes [provider]  ondansetron (ZOFRAN) 4 MG tablet Take 1 tablet (4 mg total) by mouth every 6 (six) hours. 10/12/19   Garald Balding, PA-C    Allergies    Patient has no known allergies.  Review of Systems   Review of Systems   Constitutional: Negative for chills and fever.  HENT: Negative for ear pain and sore throat.   Eyes: Negative for pain and visual disturbance.  Respiratory: Negative for cough and shortness of breath.   Cardiovascular: Negative for chest pain and palpitations.  Gastrointestinal: Positive for abdominal pain, constipation, nausea and vomiting. Negative for diarrhea.  Genitourinary: Negative for dysuria and hematuria.  Musculoskeletal: Negative for arthralgias and back pain.  Skin: Negative for color change and rash.  Neurological: Positive for dizziness and weakness. Negative for tremors, seizures, syncope, light-headedness and headaches.  Psychiatric/Behavioral: Negative for confusion.  All other systems reviewed and are negative.   Physical Exam Updated Vital Signs BP (!) 166/95 (BP Location: Left Arm)   Pulse 96   Temp 98.5 F (36.9 C) (Oral)   Resp (!) 21   Ht 5\' 10"  (1.778 m)   Wt 82.6 kg   SpO2 98%   BMI 26.11 kg/m   Physical Exam Vitals and nursing note reviewed.  Constitutional:      General: He is not in acute distress.    Appearance: Normal appearance. He is well-developed. He is not ill-appearing, toxic-appearing or diaphoretic.  HENT:     Head: Normocephalic and atraumatic.     Nose: Nose normal.     Mouth/Throat:     Mouth: Mucous membranes are moist.     Pharynx: Oropharynx is clear.  Eyes:     Conjunctiva/sclera: Conjunctivae normal.     Pupils: Pupils are equal, round, and reactive to light.  Cardiovascular:     Rate and Rhythm: Normal rate and regular rhythm.     Pulses: Normal pulses.     Heart sounds: Normal heart sounds. No murmur heard.   Pulmonary:     Effort: Pulmonary effort is normal. No respiratory distress.     Breath sounds: Normal breath sounds.  Abdominal:     General: Abdomen is flat.     Palpations: Abdomen is soft.     Tenderness: There is no abdominal tenderness. There is no right CVA tenderness or left CVA tenderness.      Comments: Abdomen soft and nontender  Musculoskeletal:        General: Tenderness present. No swelling. Normal range of motion.     Cervical back: Neck supple.     Comments: Left-sided paraspinal lumbar tenderness to palpation.  Skin:    General: Skin is warm and dry.     Coloration: Skin is not jaundiced.     Findings: No erythema or rash.  Neurological:     General: No focal deficit present.     Mental Status: He is alert and oriented to person, place, and time.     Sensory: No sensory deficit.     Motor: No weakness.     Comments:  Mental Status:  Alert, thought content appropriate, able to give a coherent history. Speech fluent without evidence of aphasia. Able to follow 2 step commands without difficulty.  Cranial Nerves:  II: Peripheral visual fields grossly normal, pupils equal, round, reactive to light III,IV, VI: ptosis not present, extra-ocular motions intact bilaterally  V,VII: smile symmetric, facial light touch sensation equal VIII: hearing grossly normal to voice  X: uvula elevates symmetrically  XI: bilateral shoulder shrug symmetric and strong XII: midline tongue extension without fassiculations Motor:  Normal tone. 5/5 strength of BUE and BLE major muscle groups including strong and equal grip strength and dorsiflexion/plantar flexion Sensory: light touch normal in all extremities. Cerebellar: normal finger-to-nose with bilateral upper extremities, Romberg sign absent    Psychiatric:        Mood and Affect: Mood normal.        Behavior: Behavior normal.     ED Results / Procedures / Treatments   Labs (all labs ordered are listed, but only abnormal results are displayed) Labs Reviewed  CBC WITH DIFFERENTIAL/PLATELET - Abnormal; Notable for the following components:      Result Value   RBC 6.33 (*)    Hemoglobin 19.0 (*)    HCT 54.1 (*)    All other components within normal limits  COMPREHENSIVE METABOLIC PANEL - Abnormal; Notable for the following  components:   Sodium 133 (*)    Potassium 3.3 (*)    Chloride 96 (*)    Glucose, Bld 225 (*)    Total Bilirubin 1.5 (*)    All other components within normal limits  URINALYSIS, ROUTINE W REFLEX MICROSCOPIC - Abnormal; Notable for the following components:   Color, Urine AMBER (*)    APPearance HAZY (*)    Glucose, UA 50 (*)    Ketones, ur 20 (*)    Protein, ur 30 (*)    All other components within normal limits  CBG MONITORING, ED - Abnormal; Notable for the following components:   Glucose-Capillary 246 (*)    All other components within normal limits  SARS CORONAVIRUS 2 BY RT PCR (HOSPITAL ORDER, [facility])  TROPONIN I (HIGH SENSITIVITY)  TROPONIN I (HIGH SENSITIVITY)    EKG EKG Interpretation  Date/Time:  Monday October 12 2019 13:41:59 EDT Ventricular Rate:  98 PR Interval:    QRS Duration: 91 QT Interval:  355 QTC Calculation: 454 R Axis:   88 Text Interpretation: Sinus rhythm LAE, consider biatrial enlargement Borderline right axis deviation Borderline repolarization abnormality No sig change from Aug 2020 ecg, No STEMI Confirmed by Octaviano Glow [phone]) on 10/12/2019 2:30:39 PM   Radiology CT ABDOMEN PELVIS W CONTRAST  Result Date: 10/12/2019 CLINICAL DATA:  Constipation. Weakness. Dizziness. EXAM: CT ABDOMEN AND PELVIS WITH CONTRAST TECHNIQUE: Multidetector CT imaging of the abdomen and pelvis was performed using the standard protocol following bolus administration of intravenous contrast. CONTRAST:  173mL OMNIPAQUE IOHEXOL 300 MG/ML  SOLN COMPARISON:  05/18/2015. FINDINGS: Lower chest: The lung bases are clear. The heart size is normal. Hepatobiliary: The liver is normal. Normal gallbladder.There is no biliary ductal dilation. Pancreas: Normal contours without ductal dilatation. No peripancreatic fluid collection. Spleen: Unremarkable. Adrenals/Urinary Tract: --Adrenal glands: Unremarkable. --Right kidney/ureter: No hydronephrosis or radiopaque  kidney stones. --Left kidney/ureter: No hydronephrosis or radiopaque kidney stones. --Urinary bladder: Unremarkable. Stomach/Bowel: --Stomach/Duodenum: There is mild wall thickening of the distal esophagus. The visualized portions of the stomach are unremarkable. --Small bowel: Unremarkable. --Colon: There is a moderate amount of stool  in the colon. --Appendix: Normal. Vascular/Lymphatic: Atherosclerotic calcification is present within the non-aneurysmal abdominal aorta, without hemodynamically significant stenosis. --No retroperitoneal lymphadenopathy. --No mesenteric lymphadenopathy. --No pelvic or inguinal lymphadenopathy. Reproductive: Unremarkable Other: No ascites or free air. The abdominal wall is normal. Musculoskeletal. No acute displaced fractures. IMPRESSION: 1. No acute abdominopelvic abnormality. 2. Moderate amount of stool in the colon. 3. Mild wall thickening of the distal esophagus, which may be secondary to esophagitis. Aortic Atherosclerosis (ICD10-I70.0). Electronically Signed   By: Constance Holster M.D.   On: 10/12/2019 15:32    Procedures Procedures (including critical care time)  Medications Ordered in ED Medications  sodium chloride 0.9 % bolus 1,000 mL (0 mLs Intravenous Stopped 10/12/19 1602)  iohexol (OMNIPAQUE) 300 MG/ML solution 100 mL (100 mLs Intravenous Contrast Given 10/12/19 1516)  famotidine (PEPCID) IVPB 20 mg premix (0 mg Intravenous Stopped 10/12/19 1755)  ondansetron (ZOFRAN) injection 4 mg (4 mg Intravenous Given 10/12/19 1559)    ED Course  I have reviewed the triage vital signs and the nursing notes.  Pertinent labs & imaging results that were available during my care of the patient were reviewed by me and considered in my medical decision making (see chart for details).    MDM Rules/Calculators/A&P                         Patient with weakness, nausea and vomiting, left-sided back pain since Friday. On presentation, the patient is alert and oriented,  nontoxic-appearing, in no acute distress.  Vitals are overall reassuring, he is afebrile.  Physical exam with soft nontender abdomen, no flank tenderness but left-sided paraspinal lumbar tenderness.  Overall well-appearing.  CBC without leukocytosis, however a hemoglobin of 19 was noted.  CBG of 246, UA without evidence of UTI, with ketones consistent in the setting of dehydration.  CMP with mild hyponatremia, mild hypokalemia of 3.3 likely in the setting of nausea and vomitting..  Normal AST/ALT/BUN/creatinine.  Upon further  Chart review, patient has been seen here in the ER for cannabis hyperemesis syndrome, however patient denies use.  EKG unchanged from previous.  Patient was also seen and evaluated by Dr. Langston Masker, given his story of constipation and nausea and vomiting, will CT the abdomen.  Patient was treated with Pepcid, Zofran and fluids.  CT of the abdomen without any acute abdominopelvic abnormality.  There was some thickening to the distal esophagus which may be secondary to esophagitis.  He also has some moderate stool in the colon.  Patient was encouraged to take over-the-counter MiraLAX or magnesium citrate to increase bowel movements.  Per Dr. Langston Masker, consulted PA K[place]as with hematology to discuss polycythemia and they feel that despite symptoms he is stable for outpatient followup. I also encouraged the patient to follow-up with GI as well he is already on Linzess for his reflux.  I do not think he needs any additional imaging, normal neuro exam, doubt intracranial normality.  No evidence of stones, or any other intra-abdominal pathology.  He denies any chest pain or shortness of breath.  Patient denies use of cannabis, however this also may be contributing to his nausea and vomiting.  Patient tolerated p.o. challenge well.  Stressed follow-up with hematology, referral provided. All the patient's questions have been answered, he voices understanding is agreeable to this plan.  Return precautions  given.  At this stage in the ED course, the patient is medically screened and stable for discharge.  Patient was seen and evaluated by Dr.  Trifan who is agreeable to the above plan and disposition. Final Clinical Impression(s) / ED Diagnoses Final diagnoses:  Dizziness  Polycythemia  Esophagitis  Constipation, unspecified constipation type    Rx / DC Orders ED Discharge Orders         Ordered    ondansetron (ZOFRAN) 4 MG tablet  Every 6 hours     Discontinue  Reprint     10/12/19 1542              Lyndel Safe 10/12/19 1825    Wyvonnia Dusky, MD 10/12/19 [phone]

## 2019-10-12 NOTE — Discharge Instructions (Addendum)
Please make sure to follow-up with hematology, I have provided the contact information to discharge paperwork in regards to your elevated hemoglobin.  Make sure to follow-up with your GI doctor as well.  Please take over-the-counter MiraLAX or magnesium citrate for constipation.  I have provided you some nausea medication.  Return to the ER if your symptoms worsen.

## 2019-10-12 NOTE — ED Triage Notes (Signed)
Pt c/o dizziness with n/v x 3 days; pt states he feels weak; pt states he feels like he can't get meds down, he feels like they get stuck in his esophagus

## 2019-10-15 ENCOUNTER — Telehealth: Payer: Self-pay | Admitting: Emergency Medicine

## 2019-10-15 NOTE — Telephone Encounter (Signed)
Pt on cancellation list.

## 2019-10-15 NOTE — Telephone Encounter (Signed)
Please place pt on the cancel list . Pt was in the hospital list

## 2019-10-21 ENCOUNTER — Encounter (HOSPITAL_COMMUNITY): Payer: Self-pay | Admitting: *Deleted

## 2019-10-21 ENCOUNTER — Other Ambulatory Visit: Payer: Self-pay

## 2019-10-21 DIAGNOSIS — E7849 Other hyperlipidemia: Secondary | ICD-10-CM | POA: Diagnosis not present

## 2019-10-21 DIAGNOSIS — I10 Essential (primary) hypertension: Secondary | ICD-10-CM | POA: Diagnosis not present

## 2019-10-21 DIAGNOSIS — E119 Type 2 diabetes mellitus without complications: Secondary | ICD-10-CM | POA: Diagnosis not present

## 2019-10-21 DIAGNOSIS — Z1389 Encounter for screening for other disorder: Secondary | ICD-10-CM | POA: Diagnosis not present

## 2019-10-21 DIAGNOSIS — E663 Overweight: Secondary | ICD-10-CM | POA: Diagnosis not present

## 2019-10-21 DIAGNOSIS — Z6825 Body mass index (BMI) 25.0-25.9, adult: Secondary | ICD-10-CM | POA: Diagnosis not present

## 2019-10-21 LAB — LIPID PANEL
Cholesterol: 171 (ref 0–200)
HDL: 37 (ref 35–70)
LDL Cholesterol: 107
Triglycerides: 151 (ref 40–160)

## 2019-10-21 LAB — TSH: TSH: 1.62 (ref 0.41–5.90)

## 2019-10-21 LAB — HEMOGLOBIN A1C: Hemoglobin A1C: >14

## 2019-10-21 LAB — BASIC METABOLIC PANEL
BUN: 22 — AB (ref 4–21)
Creatinine: 1.3 (ref 0.6–1.3)

## 2019-10-21 LAB — COMPREHENSIVE METABOLIC PANEL
GFR calc Af Amer: 70
GFR calc non Af Amer: 61

## 2019-10-22 ENCOUNTER — Inpatient Hospital Stay (HOSPITAL_COMMUNITY): Payer: BC Managed Care – PPO

## 2019-10-22 ENCOUNTER — Encounter (HOSPITAL_COMMUNITY): Payer: Self-pay | Admitting: Hematology

## 2019-10-22 ENCOUNTER — Inpatient Hospital Stay (HOSPITAL_COMMUNITY): Payer: BC Managed Care – PPO | Attending: Hematology | Admitting: Hematology

## 2019-10-22 VITALS — BP 131/91 | HR 90 | Temp 99.0°F | Resp 18 | Ht 70.0 in | Wt 175.0 lb

## 2019-10-22 DIAGNOSIS — E119 Type 2 diabetes mellitus without complications: Secondary | ICD-10-CM | POA: Diagnosis not present

## 2019-10-22 DIAGNOSIS — D582 Other hemoglobinopathies: Secondary | ICD-10-CM | POA: Diagnosis not present

## 2019-10-22 DIAGNOSIS — D751 Secondary polycythemia: Secondary | ICD-10-CM | POA: Diagnosis not present

## 2019-10-22 DIAGNOSIS — R531 Weakness: Secondary | ICD-10-CM | POA: Insufficient documentation

## 2019-10-22 DIAGNOSIS — R42 Dizziness and giddiness: Secondary | ICD-10-CM | POA: Diagnosis not present

## 2019-10-22 DIAGNOSIS — K5900 Constipation, unspecified: Secondary | ICD-10-CM | POA: Diagnosis not present

## 2019-10-22 LAB — CBC WITH DIFFERENTIAL/PLATELET
Abs Immature Granulocytes: 0.03 10*3/uL (ref 0.00–0.07)
Basophils Absolute: 0.1 10*3/uL (ref 0.0–0.1)
Basophils Relative: 1 %
Eosinophils Absolute: 0.1 10*3/uL (ref 0.0–0.5)
Eosinophils Relative: 2 %
HCT: 49.8 % (ref 39.0–52.0)
Hemoglobin: 17.3 g/dL — ABNORMAL HIGH (ref 13.0–17.0)
Immature Granulocytes: 0 %
Lymphocytes Relative: 36 %
Lymphs Abs: 2.9 10*3/uL (ref 0.7–4.0)
MCH: 29.9 pg (ref 26.0–34.0)
MCHC: 34.7 g/dL (ref 30.0–36.0)
MCV: 86.2 fL (ref 80.0–100.0)
Monocytes Absolute: 0.6 10*3/uL (ref 0.1–1.0)
Monocytes Relative: 7 %
Neutro Abs: 4.3 10*3/uL (ref 1.7–7.7)
Neutrophils Relative %: 54 %
Platelets: 225 10*3/uL (ref 150–400)
RBC: 5.78 MIL/uL (ref 4.22–5.81)
RDW: 12.2 % (ref 11.5–15.5)
WBC: 8 10*3/uL (ref 4.0–10.5)
nRBC: 0 % (ref 0.0–0.2)

## 2019-10-22 LAB — LACTATE DEHYDROGENASE: LDH: 162 U/L (ref 98–192)

## 2019-10-22 NOTE — Patient Instructions (Signed)
[facility] °Discharge Instructions ° °Follow up in 3 weeks ° ° °Thank you for choosing [facility] at [facility] to provide your oncology and hematology care.  To afford each patient quality time with our provider, please arrive at least 15 minutes before your scheduled appointment time.  ° °If you have a lab appointment with the Cancer Center please come in thru the Main Entrance and check in at the main information desk. ° °You need to re-schedule your appointment should you arrive 10 or more minutes late.  We strive to give you quality time with our providers, and arriving late affects you and other patients whose appointments are after yours.  Also, if you no show three or more times for appointments you may be dismissed from the clinic at the providers discretion.     °Again, thank you for choosing [facility].  Our hope is that these requests will decrease the amount of time that you wait before being seen by our physicians.       °_____________________________________________________________ ° °Should you have questions after your visit to [facility], please contact our office at [phone] between the hours of 8:00 a.m. and 4:30 p.m.  Voicemails left after 4:00 p.m. will not be returned until the following business day.  For prescription refill requests, have your pharmacy contact our office and allow 72 hours.   ° °Due to Covid, you will need to wear a mask upon entering the hospital. If you do not have a mask, a mask will be given to you at the Main Entrance upon arrival. For doctor visits, patients may have 1 support person with them. For treatment visits, patients can not have anyone with them due to social distancing guidelines and our immunocompromised population.  ° ° ° ° °

## 2019-10-22 NOTE — Progress Notes (Signed)
CONSULT NOTE  Patient Care Team: [facility], [place] as PCP - General (Family Medicine) Fields, Marga Melnick, MD (Inactive) as Consulting Physician (Gastroenterology)  CHIEF COMPLAINTS/PURPOSE OF CONSULTATION: Elevated hemoglobin  HISTORY OF PRESENTING ILLNESS:  Travis Sharp 64 y.o. male was sent here by his PCP for elevated hemoglobin.  Patient's hemoglobin came back elevated at 19.0.  He had elevated hemoglobin one other time in 2016 at 18.6.  Patient denies any testosterone supplementation.  He denies any respiratory disorders including COPD or asthma.  Patient reports he does not smoke, drink alcohol or any illicit drug use.  Patient denies any steroid or antibiotic use.  He denies a history of blood clots.  He does report he is itchy after hot showers.  He does have some vision changes from time to time.  He denies any frequent headaches.  He is usually cold natured not hot.  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  He does report he feels a little lightheaded after doing this.  Patient denies any recent chest pain on exertion, shortness of breath on exertion, presyncopal episodes or palpitations.  He has no prior history or diagnosis of cancer.  He has never donated blood or received a blood transfusion.  He does not take any oral iron supplementation.  He denies a family history of cancer or polycythemia.   MEDICAL HISTORY:  Past Medical History:  Diagnosis Date  . Colitis 03/23/2015  . Diabetes mellitus   . Gastritis   . Hypertension     SURGICAL HISTORY: Past Surgical History:  Procedure Laterality Date  . BIOPSY  05/10/2015   Procedure: BIOPSY;  Surgeon: Danie Binder, MD;  [facility];  Service: Endoscopy;;  gastric bx's  . COLONOSCOPY WITH PROPOFOL N/A 05/10/2015   dr. Oneida Alar: Mild diverticulosis, hemorrhoids.  Next colonoscopy 10 years  .  ESOPHAGOGASTRODUODENOSCOPY (EGD) WITH PROPOFOL N/A 05/10/2015   Dr. Oneida Alar: dysphagai due to stricture at [facility] s/p dilation, mild nonerosive gastritis.   Marland Kitchen SAVORY DILATION N/A 05/10/2015   Procedure: SAVORY DILATION;  Surgeon: Danie Binder, MD;  Location[facility];  Service: Endoscopy;  Laterality: N/A;  . TONSILLECTOMY      SOCIAL HISTORY: Social History   Socioeconomic History  . Marital status: Married    Spouse name: Olin Hauser  . Number of children: 3  . Years of education: Not on file  . Highest education level: Not on file  Occupational History  . Occupation: [employer]  Tobacco Use  . Smoking status: Former Smoker    Packs/day: 1.00    Years: 28.00    Pack years: 28.00    Quit date: 05/04/2005    Years since quitting: 14.4  . Smokeless tobacco: Never Used  Substance and Sexual Activity  . Alcohol use: Yes    Alcohol/week: 0.0 standard drinks    Comment: very seldom  . Drug use: Yes    Types: Marijuana  . Sexual activity: Not on file  Other Topics Concern  . Not on file  Social History Narrative  . Not on file   Social Determinants of Health   Financial Resource Strain: Low Risk   . Difficulty of Paying Living Expenses: Not hard at all  Food Insecurity: No Food Insecurity  . Worried About Charity fundraiser in the Last Year: Never true  . Ran Out of Food in  the Last Year: Never true  Transportation Needs: No Transportation Needs  . Lack of Transportation (Medical): No  . Lack of Transportation (Non-Medical): No  Physical Activity: Insufficiently Active  . Days of Exercise per Week: 3 days  . Minutes of Exercise per Session: 30 min  Stress: No Stress Concern Present  . Feeling of Stress : Only a little  Social Connections: Moderately Integrated  . Frequency of Communication with Friends and Family: More than three times a week  . Frequency of Social Gatherings with Friends and Family: More than three times a week  . Attends Religious Services: 1 to 4  times per year  . Active Member of Clubs or Organizations: No  . Attends Archivist Meetings: Never  . Marital Status: Married  Human resources officer Violence: Not At Risk  . Fear of Current or Ex-Partner: No  . Emotionally Abused: No  . Physically Abused: No  . Sexually Abused: No    FAMILY HISTORY: Family History  Problem Relation Age of Onset  . Colon cancer Other        possibly 2 uncles  . Stroke Mother   . Aneurysm Mother   . Heart attack Father   . Hypertension Sister   . Hypertension Brother   . Hypertension Daughter     ALLERGIES:  has No Known Allergies.  MEDICATIONS:  Current Outpatient Medications  Medication Sig Dispense Refill  . aspirin 81 MG tablet Take 81 mg by mouth daily.    Marland Kitchen linaclotide (LINZESS) 290 MCG CAPS capsule Take 1 capsule (290 mcg total) by mouth daily before breakfast. (Patient taking differently: Take 290 mcg by mouth as needed. ) 30 capsule 5  . tadalafil (CIALIS) 10 MG tablet Take 10 mg by mouth daily as needed for erectile dysfunction.    . TRESIBA FLEXTOUCH 200 UNIT/ML SOPN Inject 30 Units into the skin at bedtime.  0  . ondansetron (ZOFRAN) 4 MG tablet Take 1 tablet (4 mg total) by mouth every 6 (six) hours. (Patient not taking: Reported on 10/22/2019) 12 tablet 0   No current facility-administered medications for this visit.    REVIEW OF SYSTEMS:   Constitutional: Denies fevers, chills or abnormal night sweats, +fatigue and dizziness Respiratory: Denies cough, dyspnea or wheezes Cardiovascular: Denies palpitation, chest discomfort or lower extremity swelling Gastrointestinal:  Denies heartburn or change in bowel habits, + constipation and nausea Skin: Denies abnormal skin rashes Lymphatics: Denies new lymphadenopathy or easy bruising Neurological:Denies numbness, tingling or new weaknesses Behavioral/Psych: Mood is stable, no new changes  All other systems were reviewed with the patient and are negative.  PHYSICAL  EXAMINATION: ECOG PERFORMANCE STATUS: 1 - Symptomatic but completely ambulatory  Vitals:   10/22/19 1334  BP: (!) 131/91  Pulse: 90  Resp: 18  Temp: 99 F (37.2 C)  SpO2: 95%   Filed Weights   10/22/19 1334  Weight: 175 lb (79.4 kg)    GENERAL:alert, no distress and comfortable SKIN: skin color, texture, turgor are normal, no rashes or significant lesions NECK: supple, thyroid normal size, non-tender, without nodularity LYMPH:  no palpable lymphadenopathy in the cervical, axillary or inguinal LUNGS: clear to auscultation and percussion with normal breathing effort HEART: regular rate & rhythm and no murmurs and no lower extremity edema ABDOMEN:abdomen soft, non-tender and normal bowel sounds Musculoskeletal:no cyanosis of digits and no clubbing  PSYCH: alert & oriented x 3 with fluent speech NEURO: no focal motor/sensory deficits  LABORATORY DATA:  I have reviewed the data as  listed Recent Results (from the past 2160 hour(s))  POC CBG, ED     Status: Abnormal   Collection Time: 10/12/19 12:51 PM  Result Value Ref Range   Glucose-Capillary 246 (H) 70 - 99 mg/dL    Comment: Glucose reference range applies only to samples taken after fasting for at least 8 hours.  CBC with Differential     Status: Abnormal   Collection Time: 10/12/19  1:22 PM  Result Value Ref Range   WBC 8.3 4.0 - 10.5 K/uL   RBC 6.33 (H) 4.22 - 5.81 MIL/uL   Hemoglobin 19.0 (H) 13.0 - 17.0 g/dL   HCT 54.1 (H) 39 - 52 %   MCV 85.5 80.0 - 100.0 fL   MCH 30.0 26.0 - 34.0 pg   MCHC 35.1 30.0 - 36.0 g/dL   RDW 13.0 11.5 - 15.5 %   Platelets 240 150 - 400 K/uL   nRBC 0.0 0.0 - 0.2 %   Neutrophils Relative % 58 %   Neutro Abs 4.9 1.7 - 7.7 K/uL   Lymphocytes Relative 31 %   Lymphs Abs 2.6 0.7 - 4.0 K/uL   Monocytes Relative 8 %   Monocytes Absolute 0.7 0 - 1 K/uL   Eosinophils Relative 1 %   Eosinophils Absolute 0.1 0 - 0 K/uL   Basophils Relative 1 %   Basophils Absolute 0.1 0 - 0 K/uL   Immature  Granulocytes 1 %   Abs Immature Granulocytes 0.05 0.00 - 0.07 K/uL    Comment: Performed at [facility], [address] [place], [place] [address]  Comprehensive metabolic panel     Status: Abnormal   Collection Time: 10/12/19  1:22 PM  Result Value Ref Range   Sodium 133 (L) 135 - 145 mmol/L   Potassium 3.3 (L) 3.5 - 5.1 mmol/L   Chloride 96 (L) 98 - 111 mmol/L   CO2 23 22 - 32 mmol/L   Glucose, Bld 225 (H) 70 - 99 mg/dL    Comment: Glucose reference range applies only to samples taken after fasting for at least 8 hours.   BUN 14 8 - 23 mg/dL   Creatinine, Ser 0.87 0.61 - 1.24 mg/dL   Calcium 9.4 8.9 - 10.3 mg/dL   Total Protein 8.0 6.5 - 8.1 g/dL   Albumin 4.4 3.5 - 5.0 g/dL   AST 19 15 - 41 U/L   ALT 25 0 - 44 U/L   Alkaline Phosphatase 70 38 - 126 U/L   Total Bilirubin 1.5 (H) 0.3 - 1.2 mg/dL   GFR calc non Af Amer >60 >60 mL/min   GFR calc Af Amer >60 >60 mL/min   Anion gap 14 5 - 15    Comment: Performed at [facility], [address] [place], [place] [address]  Troponin I (High Sensitivity)     Status: None   Collection Time: 10/12/19  1:30 PM  Result Value Ref Range   Troponin I (High Sensitivity) 4 <18 ng/L    Comment: (NOTE) Elevated high sensitivity troponin I (hsTnI) values and significant  changes across serial measurements may suggest ACS but many other  chronic and acute conditions are known to elevate hsTnI results.  Refer to the "Links" section for chest pain algorithms and additional  guidance. Performed at [facility], [address] [place], [place] [address]   Urinalysis, Routine w reflex microscopic     Status: Abnormal   Collection Time: 10/12/19  1:35 PM  Result Value Ref Range  Color, Urine AMBER (A) YELLOW    Comment: BIOCHEMICALS MAY BE AFFECTED BY COLOR   APPearance HAZY (A) CLEAR   Specific Gravity, Urine 1.025 1.005 - 1.030   pH 5.0 5.0 - 8.0   Glucose, UA 50 (A) NEGATIVE mg/dL   Hgb urine dipstick NEGATIVE NEGATIVE   Bilirubin Urine  NEGATIVE NEGATIVE   Ketones, ur 20 (A) NEGATIVE mg/dL   Protein, ur 30 (A) NEGATIVE mg/dL   Nitrite NEGATIVE NEGATIVE   Leukocytes,Ua NEGATIVE NEGATIVE   RBC / HPF 0-5 0 - 5 RBC/hpf   WBC, UA 0-5 0 - 5 WBC/hpf   Bacteria, UA NONE SEEN NONE SEEN   Squamous Epithelial / LPF 0-5 0 - 5   Mucus PRESENT    Hyaline Casts, UA PRESENT     Comment: Performed at [facility], [address] [place], [place] [address]  Troponin I (High Sensitivity)     Status: None   Collection Time: 10/12/19  3:29 PM  Result Value Ref Range   Troponin I (High Sensitivity) 3 <18 ng/L    Comment: (NOTE) Elevated high sensitivity troponin I (hsTnI) values and significant  changes across serial measurements may suggest ACS but many other  chronic and acute conditions are known to elevate hsTnI results.  Refer to the "Links" section for chest pain algorithms and additional  guidance. Performed at [facility], [address] [place], [place] [address]   SARS Coronavirus 2 by RT PCR (hospital order, performed in [facility] hospital lab) Nasopharyngeal Nasopharyngeal Swab     Status: None   Collection Time: 10/12/19  4:23 PM   Specimen: Nasopharyngeal Swab  Result Value Ref Range   SARS Coronavirus 2 NEGATIVE NEGATIVE    Comment: (NOTE) SARS-CoV-2 target nucleic acids are NOT DETECTED.  The SARS-CoV-2 RNA is generally detectable in upper and lower respiratory specimens during the acute phase of infection. The lowest concentration of SARS-CoV-2 viral copies this assay can detect is 250 copies / mL. A negative result does not preclude SARS-CoV-2 infection and should not be used as the sole basis for treatment or other patient management decisions.  A negative result may occur with improper specimen collection / handling, submission of specimen other than nasopharyngeal swab, presence of viral mutation(s) within the areas targeted by this assay, and inadequate number of viral copies (<250 copies / mL). A  negative result must be combined with clinical observations, patient history, and epidemiological information.  Fact Sheet for Patients:   StrictlyIdeas.no  Fact Sheet for Healthcare Providers: BankingDealers.co.za  This test is not yet approved or  cleared by the [place] FDA and has been authorized for detection and/or diagnosis of SARS-CoV-2 by FDA under an Emergency Use Authorization (EUA).  This EUA will remain in effect (meaning this test can be used) for the duration of the COVID-19 declaration under Section 564(b)(1) of the Act, 21 U.S.C. section 360bbb-3(b)(1), unless the authorization is terminated or revoked sooner.  Performed at [facility], [address], [place], [place] [address]   CBC with Differential/Platelet     Status: Abnormal   Collection Time: 10/22/19  3:05 PM  Result Value Ref Range   WBC 8.0 4.0 - 10.5 K/uL   RBC 5.78 4.22 - 5.81 MIL/uL   Hemoglobin 17.3 (H) 13.0 - 17.0 g/dL   HCT 49.8 39 - 52 %   MCV 86.2 80.0 - 100.0 fL   MCH 29.9 26.0 - 34.0 pg   MCHC 34.7 30.0 - 36.0 g/dL  RDW 12.2 11.5 - 15.5 %   Platelets 225 150 - 400 K/uL   nRBC 0.0 0.0 - 0.2 %   Neutrophils Relative % 54 %   Neutro Abs 4.3 1.7 - 7.7 K/uL   Lymphocytes Relative 36 %   Lymphs Abs 2.9 0.7 - 4.0 K/uL   Monocytes Relative 7 %   Monocytes Absolute 0.6 0 - 1 K/uL   Eosinophils Relative 2 %   Eosinophils Absolute 0.1 0 - 0 K/uL   Basophils Relative 1 %   Basophils Absolute 0.1 0 - 0 K/uL   Immature Granulocytes 0 %   Abs Immature Granulocytes 0.03 0.00 - 0.07 K/uL    Comment: Performed at [facility], [address], [place], [place] [address]  Lactate dehydrogenase     Status: None   Collection Time: 10/22/19  3:05 PM  Result Value Ref Range   LDH 162 98 - 192 U/L    Comment: Performed at [facility], [address] [place], [place] [address]    RADIOGRAPHIC STUDIES: I have personally reviewed the radiological images  as listed and agreed with the findings in the report. CT ABDOMEN PELVIS W CONTRAST  Result Date: 10/12/2019 CLINICAL DATA:  Constipation. Weakness. Dizziness. EXAM: CT ABDOMEN AND PELVIS WITH CONTRAST TECHNIQUE: Multidetector CT imaging of the abdomen and pelvis was performed using the standard protocol following bolus administration of intravenous contrast. CONTRAST:  146mL OMNIPAQUE IOHEXOL 300 MG/ML  SOLN COMPARISON:  05/18/2015. FINDINGS: Lower chest: The lung bases are clear. The heart size is normal. Hepatobiliary: The liver is normal. Normal gallbladder.There is no biliary ductal dilation. Pancreas: Normal contours without ductal dilatation. No peripancreatic fluid collection. Spleen: Unremarkable. Adrenals/Urinary Tract: --Adrenal glands: Unremarkable. --Right kidney/ureter: No hydronephrosis or radiopaque kidney stones. --Left kidney/ureter: No hydronephrosis or radiopaque kidney stones. --Urinary bladder: Unremarkable. Stomach/Bowel: --Stomach/Duodenum: There is mild wall thickening of the distal esophagus. The visualized portions of the stomach are unremarkable. --Small bowel: Unremarkable. --Colon: There is a moderate amount of stool in the colon. --Appendix: Normal. Vascular/Lymphatic: Atherosclerotic calcification is present within the non-aneurysmal abdominal aorta, without hemodynamically significant stenosis. --No retroperitoneal lymphadenopathy. --No mesenteric lymphadenopathy. --No pelvic or inguinal lymphadenopathy. Reproductive: Unremarkable Other: No ascites or free air. The abdominal wall is normal. Musculoskeletal. No acute displaced fractures. IMPRESSION: 1. No acute abdominopelvic abnormality. 2. Moderate amount of stool in the colon. 3. Mild wall thickening of the distal esophagus, which may be secondary to esophagitis. Aortic Atherosclerosis (ICD10-I70.0). Electronically Signed   By: Constance Holster M.D.   On: 10/12/2019 15:32   I have independently interviewed and examined this  patient.  I agree with HPI written by my nurse practitioner Wenda Low, FNP.  I have independently formulated my assessment and plan.  ASSESSMENT & PLAN:  1.  Erythrocytosis: -CBC on 10/12/2019 shows hemoglobin 19, hematocrit 54.1. -Prior to that CBC on 11/25/2018 shows hemoglobin 17.5 and hematocrit of 50.3. -Denies any smoking history.  Denies any sleep apnea but reports having difficulty sleeping.  No use of testosterone supplements. -Review of medications did not reveal any diuretic use. -We will check CBC today and send serum erythropoietin level. -We will also check JAK2 [id number] mutation.  We will check carboxyhemoglobin level because of his work-related exposure to steam and chemicals. -We will plan to see him back in 2 weeks for follow-up.  2.  Diabetes: -Continue Tresiba 30 units at bedtime.  3.  Constipation: -Continue Linzess to 90 mcg daily as needed.   All questions were answered. The  patient knows to call the clinic with any problems, questions or concerns.     Derek Jack, MD 10/22/19 6:05 PM

## 2019-10-23 ENCOUNTER — Other Ambulatory Visit (HOSPITAL_COMMUNITY): Payer: BC Managed Care – PPO

## 2019-10-26 ENCOUNTER — Other Ambulatory Visit (HOSPITAL_COMMUNITY): Payer: Self-pay | Admitting: *Deleted

## 2019-10-26 ENCOUNTER — Inpatient Hospital Stay (HOSPITAL_COMMUNITY): Payer: BC Managed Care – PPO

## 2019-10-26 ENCOUNTER — Other Ambulatory Visit: Payer: Self-pay

## 2019-10-26 DIAGNOSIS — D582 Other hemoglobinopathies: Secondary | ICD-10-CM

## 2019-10-26 DIAGNOSIS — R531 Weakness: Secondary | ICD-10-CM | POA: Diagnosis not present

## 2019-10-26 DIAGNOSIS — D751 Secondary polycythemia: Secondary | ICD-10-CM | POA: Diagnosis not present

## 2019-10-26 DIAGNOSIS — E119 Type 2 diabetes mellitus without complications: Secondary | ICD-10-CM | POA: Diagnosis not present

## 2019-10-26 DIAGNOSIS — K5900 Constipation, unspecified: Secondary | ICD-10-CM | POA: Diagnosis not present

## 2019-10-26 DIAGNOSIS — R42 Dizziness and giddiness: Secondary | ICD-10-CM | POA: Diagnosis not present

## 2019-10-26 LAB — CARBOXYHEMOGLOBIN - COOX: Carboxyhemoglobin: 1 % (ref 0.5–1.5)

## 2019-10-28 ENCOUNTER — Other Ambulatory Visit: Payer: Self-pay

## 2019-10-28 ENCOUNTER — Encounter: Payer: Self-pay | Admitting: Gastroenterology

## 2019-10-28 ENCOUNTER — Ambulatory Visit (INDEPENDENT_AMBULATORY_CARE_PROVIDER_SITE_OTHER): Payer: BC Managed Care – PPO | Admitting: Gastroenterology

## 2019-10-28 VITALS — BP 126/81 | HR 101 | Temp 97.3°F | Ht 70.0 in | Wt 176.8 lb

## 2019-10-28 DIAGNOSIS — K5900 Constipation, unspecified: Secondary | ICD-10-CM

## 2019-10-28 DIAGNOSIS — K219 Gastro-esophageal reflux disease without esophagitis: Secondary | ICD-10-CM | POA: Diagnosis not present

## 2019-10-28 DIAGNOSIS — R1310 Dysphagia, unspecified: Secondary | ICD-10-CM | POA: Diagnosis not present

## 2019-10-28 DIAGNOSIS — R1319 Other dysphagia: Secondary | ICD-10-CM

## 2019-10-28 MED ORDER — PANTOPRAZOLE SODIUM 40 MG PO TBEC
40.0000 mg | DELAYED_RELEASE_TABLET | Freq: Every day | ORAL | 1 refills | Status: DC
Start: 2019-10-28 — End: 2019-12-03

## 2019-10-28 MED ORDER — LINACLOTIDE 145 MCG PO CAPS: 145.0000 ug | ORAL_CAPSULE | Freq: Every day | ORAL | 3 refills | Status: DC

## 2019-10-28 NOTE — Patient Instructions (Signed)
1. Start Linzess 132mcg daily as needed for constipation.  2. Start pantoprazole 40mg  daily before breakfast for reflux. Continue for at least two months before trying to stop. If you continue to have a lot of reflux, abdominal pain, swallowing issues you should have an upper endoscopy.  3. Return to the office in 3 months.

## 2019-10-28 NOTE — Progress Notes (Signed)
Primary Care Physician: [facility]  Primary Gastroenterologist:  Formerly Barney Drain, MD   Chief Complaint  Patient presents with  . Constipation    had left side pain with constipation  . Dysphagia    sometimes feels like going to vomit  . Gastroesophageal Reflux  . Nausea    HPI: Travis Sharp is a 64 y.o. male here for hospital follow-up.  Seen in the emergency department a couple of weeks ago for dizziness, weakness, nausea and vomiting.  CT abdomen pelvis with contrast showed mild wall thickening of the distal esophagus, moderate amount of stool in the colon. He was advised to follow-up with [place].  Patient had covid in 04/2019. He lost since of taste/smell. He has since struggled with appetite. Taste started coming back some two weeks ago. Now some foods he used to love, he does not like the taste. Says he weighed 190 pounds in 04/2019. Down to 175 pounds but weight stable. Wants to keep weight at this range. He notes his A1C has been in double digits. He contributes this to him added sugar and salt to his food to try and get a taste. He is now trying to avoid and eat correctly.   He complains of regurgitation of acid. Does not have heartburn. No longer with vomiting. Often regurgitates during meals but able to go back to eating after it happens. He complains of some mild intermittent dysphagia to solids. No abdominal pain. BM couple of times per week. Less BMs with decreased oral intake. No melena, brbpr. He has added probiotics. Some early satiety.      Current Outpatient Medications  Medication Sig Dispense Refill  . aspirin 81 MG tablet Take 81 mg by mouth daily.    . tadalafil (CIALIS) 10 MG tablet Take 10 mg by mouth daily as needed for erectile dysfunction.    . TRESIBA FLEXTOUCH 200 UNIT/ML SOPN Inject 30 Units into the skin at bedtime.  0   No current facility-administered medications for this visit.    Allergies as of 10/28/2019  . (No  Known Allergies)   Past Medical History:  Diagnosis Date  . Colitis 03/23/2015  . Diabetes mellitus   . Gastritis   . Hypertension    Past Surgical History:  Procedure Laterality Date  . BIOPSY  05/10/2015   Procedure: BIOPSY;  Surgeon: Danie Binder, MD;  [facility];  Service: Endoscopy;;  gastric bx's  . COLONOSCOPY WITH PROPOFOL N/A 05/10/2015   dr. Oneida Alar: Mild diverticulosis, hemorrhoids.  Next colonoscopy 10 years  . ESOPHAGOGASTRODUODENOSCOPY (EGD) WITH PROPOFOL N/A 05/10/2015   Dr. Oneida Alar: dysphagai due to stricture at [facility] s/p dilation, mild nonerosive gastritis.   Marland Kitchen SAVORY DILATION N/A 05/10/2015   Procedure: SAVORY DILATION;  Surgeon: Danie Binder, MD;  [facility];  Service: Endoscopy;  Laterality: N/A;  . TONSILLECTOMY     Family History  Problem Relation Age of Onset  . Colon cancer Other        possibly 2 uncles  . Stroke Mother   . Aneurysm Mother   . Heart attack Father   . Hypertension Sister   . Hypertension Brother   . Hypertension Daughter    Social History   Tobacco Use  . Smoking status: Former Smoker    Packs/day: 1.00    Years: 28.00    Pack years: 28.00    Quit date: 05/04/2005    Years since quitting: 14.4  .  Smokeless tobacco: Never Used  Substance Use Topics  . Alcohol use: Not Currently    Alcohol/week: 0.0 standard drinks    Comment: very seldom  . Drug use: Not Currently    Types: Marijuana    Comment: denied 10/28/19    ROS:  General: Negative for anorexia,   fever, chills, fatigue, weakness. See hpi ENT: Negative for hoarseness, difficulty swallowing , nasal congestion. CV: Negative for chest pain, angina, palpitations, dyspnea on exertion, peripheral edema.  Respiratory: Negative for dyspnea at rest, dyspnea on exertion, cough, sputum, wheezing.  GI: See history of present illness. GU:  Negative for dysuria, hematuria, urinary incontinence, urinary frequency, nocturnal urination.  Endo: Negative for  unusual weight change.    Physical Examination:   BP 126/81   Pulse (!) 101   Temp (!) 97.3 F (36.3 C) (Temporal)   Ht 5\' 10"  (1.778 m)   Wt 176 lb 12.8 oz (80.2 kg)   BMI 25.37 kg/m   General: Well-nourished, well-developed in no acute distress.  Eyes: No icterus. Mouth: masked Lungs: Clear to auscultation bilaterally.  Heart: Regular rate and rhythm, no murmurs rubs or gallops.  Abdomen: Bowel sounds are normal, nontender, nondistended, no hepatosplenomegaly or masses, no abdominal bruits or hernia , no rebound or guarding.   Extremities: No lower extremity edema. No clubbing or deformities. Neuro: Alert and oriented x 4   Skin: Warm and dry, no jaundice.   Psych: Alert and cooperative, normal mood and affect.  Labs:  Lab Results  Component Value Date   CREATININE 0.87 10/12/2019   BUN 14 10/12/2019   NA 133 (L) 10/12/2019   K 3.3 (L) 10/12/2019   CL 96 (L) 10/12/2019   CO2 23 10/12/2019   Lab Results  Component Value Date   ALT 25 10/12/2019   AST 19 10/12/2019   ALKPHOS 70 10/12/2019   BILITOT 1.5 (H) 10/12/2019   Lab Results  Component Value Date   WBC 8.0 10/22/2019   HGB 17.3 (H) 10/22/2019   HCT 49.8 10/22/2019   MCV 86.2 10/22/2019   PLT 225 10/22/2019   No results found for: IRON, TIBC, FERRITIN   Imaging Studies: CT ABDOMEN PELVIS W CONTRAST  Result Date: 10/12/2019 CLINICAL DATA:  Constipation. Weakness. Dizziness. EXAM: CT ABDOMEN AND PELVIS WITH CONTRAST TECHNIQUE: Multidetector CT imaging of the abdomen and pelvis was performed using the standard protocol following bolus administration of intravenous contrast. CONTRAST:  125mL OMNIPAQUE IOHEXOL 300 MG/ML  SOLN COMPARISON:  05/18/2015. FINDINGS: Lower chest: The lung bases are clear. The heart size is normal. Hepatobiliary: The liver is normal. Normal gallbladder.There is no biliary ductal dilation. Pancreas: Normal contours without ductal dilatation. No peripancreatic fluid collection. Spleen:  Unremarkable. Adrenals/Urinary Tract: --Adrenal glands: Unremarkable. --Right kidney/ureter: No hydronephrosis or radiopaque kidney stones. --Left kidney/ureter: No hydronephrosis or radiopaque kidney stones. --Urinary bladder: Unremarkable. Stomach/Bowel: --Stomach/Duodenum: There is mild wall thickening of the distal esophagus. The visualized portions of the stomach are unremarkable. --Small bowel: Unremarkable. --Colon: There is a moderate amount of stool in the colon. --Appendix: Normal. Vascular/Lymphatic: Atherosclerotic calcification is present within the non-aneurysmal abdominal aorta, without hemodynamically significant stenosis. --No retroperitoneal lymphadenopathy. --No mesenteric lymphadenopathy. --No pelvic or inguinal lymphadenopathy. Reproductive: Unremarkable Other: No ascites or free air. The abdominal wall is normal. Musculoskeletal. No acute displaced fractures. IMPRESSION: 1. No acute abdominopelvic abnormality. 2. Moderate amount of stool in the colon. 3. Mild wall thickening of the distal esophagus, which may be secondary to esophagitis. Aortic Atherosclerosis (ICD10-I70.0). Electronically Signed  By: Constance Holster M.D.   On: 10/12/2019 15:32    Impression/Plan:  64 y/o male with history of postprandial regurgitation, nausea in the setting of altered sense of taste/smell due to Covid, poorly controlled diabetes. His taste/smell are finally coming back but not normal. He lost 15 pounds but this has stabilized. He has some mild intermittent dysphagia to solids. CT with mild wall thickening of distal esophagus.   Suspect element of reflux esophagitis +/- diabetic gastroparesis in setting of poorly controlled DM. Gastritis/PUD on differential as well. Malignancy less likely. Patient wants to try medication prior to considering upper endoscopy. He also has ongoing constipation issues.   Will start Linzess 168mcg daily. Start pantoprazole 40mg  daily before breakfast. Continue  pantoprazole for at least 2-3 months. Call if symptoms worsen or do not resolved, at which time he states he would agree to upper endoscopy.   Return to the office in 3 months.

## 2019-10-29 LAB — JAK2 V617F, W REFLEX TO CALR/E12/MPL

## 2019-10-29 LAB — CALR + JAK2 E12-15 + MPL (REFLEXED)

## 2019-11-03 ENCOUNTER — Telehealth: Payer: Self-pay | Admitting: Emergency Medicine

## 2019-11-03 MED ORDER — LUBIPROSTONE 24 MCG PO CAPS
ORAL_CAPSULE | ORAL | 1 refills | Status: DC
Start: 2019-11-03 — End: 2021-05-08

## 2019-11-03 NOTE — Addendum Note (Signed)
Addended by: Mahala Menghini on: 11/03/2019 01:49 PM   Modules accepted: Orders

## 2019-11-03 NOTE — Telephone Encounter (Signed)
I'm not sure what they are requesting. I sent in rx for pantoprazole 40mg  daily before breakfast #90 with 1 refill on 10/28/19.

## 2019-11-03 NOTE — Telephone Encounter (Signed)
rx for [facility] sent.

## 2019-11-03 NOTE — Telephone Encounter (Signed)
pts insurance states pt must try and fail amitiza and bisacodyl ec before they will approve linzess 164mcg caps

## 2019-11-03 NOTE — Addendum Note (Signed)
Addended by: Mahala Menghini on: 11/03/2019 01:50 PM   Modules accepted: Orders

## 2019-11-03 NOTE — Telephone Encounter (Signed)
[facility] is requesting a drug change for pantoprazole 40mg  tablets  Take one tablet by mouth daily before breakfast

## 2019-11-03 NOTE — Telephone Encounter (Signed)
Called [facility] and pt to very request. [facility] However, pt [facility] please disregard

## 2019-11-04 NOTE — Telephone Encounter (Signed)
OK. Thanks.

## 2019-11-16 ENCOUNTER — Inpatient Hospital Stay (HOSPITAL_COMMUNITY): Payer: BC Managed Care – PPO | Attending: Hematology | Admitting: Hematology

## 2019-11-16 ENCOUNTER — Other Ambulatory Visit: Payer: Self-pay

## 2019-11-16 VITALS — BP 136/76 | HR 81 | Temp 97.5°F | Resp 18 | Wt 182.7 lb

## 2019-11-16 DIAGNOSIS — Z87891 Personal history of nicotine dependence: Secondary | ICD-10-CM | POA: Insufficient documentation

## 2019-11-16 DIAGNOSIS — M25512 Pain in left shoulder: Secondary | ICD-10-CM | POA: Diagnosis not present

## 2019-11-16 DIAGNOSIS — E119 Type 2 diabetes mellitus without complications: Secondary | ICD-10-CM | POA: Insufficient documentation

## 2019-11-16 DIAGNOSIS — Z800 Family history of malignant neoplasm of digestive organs: Secondary | ICD-10-CM | POA: Diagnosis not present

## 2019-11-16 DIAGNOSIS — Z7982 Long term (current) use of aspirin: Secondary | ICD-10-CM | POA: Insufficient documentation

## 2019-11-16 DIAGNOSIS — K5900 Constipation, unspecified: Secondary | ICD-10-CM | POA: Diagnosis not present

## 2019-11-16 DIAGNOSIS — D582 Other hemoglobinopathies: Secondary | ICD-10-CM

## 2019-11-16 DIAGNOSIS — D751 Secondary polycythemia: Secondary | ICD-10-CM | POA: Insufficient documentation

## 2019-11-16 NOTE — Patient Instructions (Signed)
[place] at [facility] Discharge Instructions  You were seen today by Dr. Delton Coombes. 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 Continue drinking plenty of fluids daily. Dr. Delton Coombes will see you back in 4 months for labs and follow up.   Thank you for choosing [place] at [facility] to provide your oncology and hematology care.  To afford each patient quality time with our provider, please arrive at least 15 minutes before your scheduled appointment time.   If you have a lab appointment with the [place] please come in thru the Main Entrance and check in at the main information desk  You need to re-schedule your appointment should you arrive 10 or more minutes late.  We strive to give you quality time with our providers, and arriving late affects you and other patients whose appointments are after yours.  Also, if you no show three or more times for appointments you may be dismissed from the clinic at the providers discretion.     Again, thank you for choosing [facility].  Our hope is that these requests will decrease the amount of time that you wait before being seen by our physicians.       _____________________________________________________________  Should you have questions after your visit to [facility], please contact our office at [phone] between the hours of 8:00 a.m. and 4:30 p.m.  Voicemails left after 4:00 p.m. will not be returned until the following business day.  For prescription refill requests, have your pharmacy contact our office and allow 72 hours.    Cancer Center Support Programs:   > Cancer Support Group  2nd Tuesday of the month 1pm-2pm, Journey Room

## 2019-11-16 NOTE — Progress Notes (Signed)
Travis Sharp, Travis Sharp   CLINIC:  Medical Oncology/Hematology  PCP:  [facility] [place] /  [place] [address]  [phone]  REASON FOR VISIT:  Follow-up for elevated hemoglobin  PRIOR THERAPY: None  CURRENT THERAPY: Observation  INTERVAL HISTORY:  Travis Sharp, a 64 y.o. male, returns for routine follow-up for his elevated hemogloben. Travis Sharp was last seen on 10/22/2019.  Today he is accompanied by his wife. He reports feeling well overall. He is drinking plenty of fluids daily. He continues having trouble falling asleep. He complains of pain in his left shoulder and arm for the past 1 month since having an accident at work.  He works at [employer].   REVIEW OF SYSTEMS:  Review of Systems  Constitutional: Positive for fatigue (mild). Negative for appetite change.  Gastrointestinal: Positive for constipation.  Musculoskeletal: Positive for arthralgias (L shoulder and arm pain).  Neurological: Positive for numbness (L foot).  All other systems reviewed and are negative.   PAST MEDICAL/SURGICAL HISTORY:  Past Medical History:  Diagnosis Date  . Colitis 03/23/2015  . Diabetes mellitus   . Gastritis   . Hypertension    Past Surgical History:  Procedure Laterality Date  . BIOPSY  05/10/2015   Procedure: BIOPSY;  Surgeon: Danie Binder, MD;  [facility];  Service: Endoscopy;;  gastric bx's  . COLONOSCOPY WITH PROPOFOL N/A 05/10/2015   dr. Oneida Alar: Mild diverticulosis, hemorrhoids.  Next colonoscopy 10 years  . ESOPHAGOGASTRODUODENOSCOPY (EGD) WITH PROPOFOL N/A 05/10/2015   Dr. Oneida Alar: dysphagai due to stricture at [facility] s/p dilation, mild nonerosive gastritis.   Marland Kitchen SAVORY DILATION N/A 05/10/2015   Procedure: SAVORY DILATION;  Surgeon: Danie Binder, MD;  [facility];  Service: Endoscopy;  Laterality: N/A;  . TONSILLECTOMY      SOCIAL HISTORY:  Social History     Socioeconomic History  . Marital status: Married    Spouse name: Olin Hauser  . Number of children: 3  . Years of education: Not on file  . Highest education level: Not on file  Occupational History  . Occupation: [employer]  Tobacco Use  . Smoking status: Former Smoker    Packs/day: 1.00    Years: 28.00    Pack years: 28.00    Quit date: 05/04/2005    Years since quitting: 14.5  . Smokeless tobacco: Never Used  Substance and Sexual Activity  . Alcohol use: Not Currently    Alcohol/week: 0.0 standard drinks    Comment: very seldom  . Drug use: Not Currently    Types: Marijuana    Comment: denied 10/28/19  . Sexual activity: Not on file  Other Topics Concern  . Not on file  Social History Narrative  . Not on file   Social Determinants of Health   Financial Resource Strain: Low Risk   . Difficulty of Paying Living Expenses: Not hard at all  Food Insecurity: No Food Insecurity  . Worried About Charity fundraiser in the Last Year: Never true  . Ran Out of Food in the Last Year: Never true  Transportation Needs: No Transportation Needs  . Lack of Transportation (Medical): No  . Lack of Transportation (Non-Medical): No  Physical Activity: Insufficiently Active  . Days of Exercise per Week: 3 days  . Minutes of Exercise per Session: 30 min  Stress: No Stress Concern Present  . Feeling of Stress : Only a  little  Social Connections: Moderately Integrated  . Frequency of Communication with Friends and Family: More than three times a week  . Frequency of Social Gatherings with Friends and Family: More than three times a week  . Attends Religious Services: 1 to 4 times per year  . Active Member of Clubs or Organizations: No  . Attends Archivist Meetings: Never  . Marital Status: Married  Human resources officer Violence: Not At Risk  . Fear of Current or Ex-Partner: No  . Emotionally Abused: No  . Physically Abused: No  . Sexually Abused: No    FAMILY HISTORY:   Family History  Problem Relation Age of Onset  . Colon cancer Other        possibly 2 uncles  . Stroke Mother   . Aneurysm Mother   . Heart attack Father   . Hypertension Sister   . Hypertension Brother   . Hypertension Daughter     CURRENT MEDICATIONS:  Current Outpatient Medications  Medication Sig Dispense Refill  . aspirin 81 MG tablet Take 81 mg by mouth daily.    Marland Kitchen DILT-XR 180 MG 24 hr capsule Take 180 mg by mouth daily.    Marland Kitchen lubiprostone (AMITIZA) 24 MCG capsule Take one to two times daily with food for constipation 180 capsule 1  . pantoprazole (PROTONIX) 40 MG tablet Take 1 tablet (40 mg total) by mouth daily before breakfast. 90 tablet 1  . TRESIBA FLEXTOUCH 200 UNIT/ML SOPN Inject 30 Units into the skin at bedtime.  0  . tadalafil (CIALIS) 10 MG tablet Take 10 mg by mouth daily as needed for erectile dysfunction. (Patient not taking: Reported on 11/16/2019)     No current facility-administered medications for this visit.    ALLERGIES:  No Known Allergies  PHYSICAL EXAM:  Performance status (ECOG): 1 - Symptomatic but completely ambulatory  Vitals:   11/16/19 1448  BP: 136/76  Pulse: 81  Resp: 18  Temp: (!) 97.5 F (36.4 C)  SpO2: 99%   Wt Readings from Last 3 Encounters:  11/16/19 182 lb 11.2 oz (82.9 kg)  10/28/19 176 lb 12.8 oz (80.2 kg)  10/22/19 175 lb (79.4 kg)   Physical Exam Vitals reviewed.  Constitutional:      Appearance: Normal appearance.  Cardiovascular:     Rate and Rhythm: Normal rate and regular rhythm.     Pulses: Normal pulses.     Heart sounds: Normal heart sounds.  Pulmonary:     Effort: Pulmonary effort is normal.     Breath sounds: Normal breath sounds.  Abdominal:     Palpations: Abdomen is soft. There is no mass.     Tenderness: There is no abdominal tenderness.  Musculoskeletal:     Right lower leg: No edema.     Left lower leg: No edema.  Neurological:     General: No focal deficit present.     Mental Status: He is  alert and oriented to person, place, and time.  Psychiatric:        Mood and Affect: Mood normal.        Behavior: Behavior normal.     LABORATORY DATA:  I have reviewed the labs as listed.  CBC Latest Ref Rng & Units 10/22/2019 10/12/2019 11/25/2018  WBC 4.0 - 10.5 K/uL 8.0 8.3 11.4(H)  Hemoglobin 13.0 - 17.0 g/dL 17.3(H) 19.0(H) 17.5(H)  Hematocrit 39 - 52 % 49.8 54.1(H) 50.3  Platelets 150 - 400 K/uL 225 240 257   CMP Latest  Ref Rng & Units 10/12/2019 11/25/2018 12/12/2016  Glucose 70 - 99 mg/dL 225(H) 161(H) 301(H)  BUN 8 - 23 mg/dL 14 14 11   Creatinine 0.61 - 1.24 mg/dL 0.87 0.91 1.01  Sodium 135 - 145 mmol/L 133(L) 139 135  Potassium 3.5 - 5.1 mmol/L 3.3(L) 3.2(L) 3.2(L)  Chloride 98 - 111 mmol/L 96(L) 104 99(L)  CO2 22 - 32 mmol/L 23 21(L) 23  Calcium 8.9 - 10.3 mg/dL 9.4 9.8 9.5  Total Protein 6.5 - 8.1 g/dL 8.0 8.7(H) 8.1  Total Bilirubin 0.3 - 1.2 mg/dL 1.5(H) 1.1 1.4(H)  Alkaline Phos 38 - 126 U/L 70 58 63  AST 15 - 41 U/L 19 29 27   ALT 0 - 44 U/L 25 25 25       Component Value Date/Time   RBC 5.78 10/22/2019 1505   MCV 86.2 10/22/2019 1505   MCH 29.9 10/22/2019 1505   MCHC 34.7 10/22/2019 1505   RDW 12.2 10/22/2019 1505   LYMPHSABS 2.9 10/22/2019 1505   MONOABS 0.6 10/22/2019 1505   EOSABS 0.1 10/22/2019 1505   BASOSABS 0.1 10/22/2019 1505   Lab Results  Component Value Date   LDH 162 10/22/2019    DIAGNOSTIC IMAGING:  I have independently reviewed the scans and discussed with the patient. No results found.   ASSESSMENT:  1.  Erythrocytosis: -CBC on 10/12/2019 with hemoglobin 19 and hematocrit 54.1. -Prior CBC on 11/25/2018 shows hemoglobin 17.5 and hematocrit 50.3. -Non-smoker.  Denies any sleep apnea.  No use of testosterone supplements. -Works in the [employer] at [employer].  PLAN:  1.  Erythrocytosis: -We reviewed labs from 10/22/2019.  Hemoglobin improved to 17.3 and hematocrit 49.8.  Platelet count and white count is normal.  LDH was  normal. -Carboxyhemoglobin was 1.0. -We reviewed results of JAK2 V617F testing which was negative. -However deletion mutation of CAL reticulin gene was noted.  This mutation is generally seen in JAK2 negative ET and JAK2 negative PMF.  It is also seen in about 8% of patients with MDS.  It can be rarely detected in AML, CML, some solid tumors.  This mutation is not detected in polycythemia. -At this time his platelet count is normal.  No clinical evidence of myelofibrosis. -We will continue monitor his blood counts to evaluate for any evolving process.  I will check his CBC in 4 months.  If it remains stable, we will check his CBC every 6 months. -He will follow up with orthopedics for his left shoulder pain.  2.  Diabetes: -Continue Tresiba 30 units at bedtime.  3.  Constipation: -Continue Linzess 227mcg daily as needed.  Orders placed this encounter:  No orders of the defined types were placed in this encounter.    Derek Jack, MD [place] [phone]   I, Milinda Antis, am acting as a scribe for Dr. Sanda Linger.  I, Derek Jack MD, have reviewed the above documentation for accuracy and completeness, and I agree with the above.

## 2019-11-30 NOTE — Telephone Encounter (Signed)
Received a prior auth request for Amitiza. PA done and denied by [employer] ([place]) , denial stated pt must try and fail trulance.   Magda Paganini, is it ok to send in Trulance?

## 2019-12-02 MED ORDER — TRULANCE 3 MG PO TABS: 3.0000 mg | ORAL_TABLET | Freq: Every day | ORAL | 11 refills | Status: DC

## 2019-12-02 NOTE — Telephone Encounter (Signed)
Angie, will you let him know please.

## 2019-12-02 NOTE — Telephone Encounter (Signed)
Called pt and informed him that RX for trulance has been sent to [facility].  Pt voiced understanding.

## 2019-12-02 NOTE — Telephone Encounter (Signed)
Can try trulance. rx sent.

## 2019-12-02 NOTE — Addendum Note (Signed)
Addended by: Mahala Menghini on: 12/02/2019 03:44 PM   Modules accepted: Orders

## 2019-12-03 ENCOUNTER — Ambulatory Visit (INDEPENDENT_AMBULATORY_CARE_PROVIDER_SITE_OTHER): Payer: BC Managed Care – PPO | Admitting: "Endocrinology

## 2019-12-03 ENCOUNTER — Other Ambulatory Visit: Payer: Self-pay

## 2019-12-03 ENCOUNTER — Encounter: Payer: Self-pay | Admitting: "Endocrinology

## 2019-12-03 VITALS — BP 138/86 | HR 72 | Ht 70.0 in | Wt 182.2 lb

## 2019-12-03 DIAGNOSIS — E1165 Type 2 diabetes mellitus with hyperglycemia: Secondary | ICD-10-CM | POA: Diagnosis not present

## 2019-12-03 DIAGNOSIS — E782 Mixed hyperlipidemia: Secondary | ICD-10-CM | POA: Insufficient documentation

## 2019-12-03 DIAGNOSIS — I10 Essential (primary) hypertension: Secondary | ICD-10-CM | POA: Diagnosis not present

## 2019-12-03 MED ORDER — INSULIN ASPART 100 UNIT/ML FLEXPEN: 6.0000 [IU] | PEN_INJECTOR | Freq: Three times a day (TID) | SUBCUTANEOUS | 1 refills | Status: DC

## 2019-12-03 NOTE — Patient Instructions (Signed)

## 2019-12-03 NOTE — Progress Notes (Signed)
Endocrinology Consult Note       12/03/2019, 12:37 PM   Subjective:    Patient ID: Travis Sharp, male    DOB: 10/26/1955.  Travis Sharp is being seen in consultation for management of currently uncontrolled symptomatic diabetes requested by  Morgan Jawan, [facility].   Past Medical History:  Diagnosis Date  . Colitis 03/23/2015  . Diabetes mellitus   . Gastritis   . Hypertension     Past Surgical History:  Procedure Laterality Date  . BIOPSY  05/10/2015   Procedure: BIOPSY;  Surgeon: Danie Binder, MD;  Location: [facility];  Service: Endoscopy;;  gastric bx's  . COLONOSCOPY WITH PROPOFOL N/A 05/10/2015   dr. Oneida Alar: Mild diverticulosis, hemorrhoids.  Next colonoscopy 10 years  . ESOPHAGOGASTRODUODENOSCOPY (EGD) WITH PROPOFOL N/A 05/10/2015   Dr. Oneida Alar: dysphagai due to stricture at [facility] s/p dilation, mild nonerosive gastritis.   Marland Kitchen SAVORY DILATION N/A 05/10/2015   Procedure: SAVORY DILATION;  Surgeon: Danie Binder, MD;  Location: [facility];  Service: Endoscopy;  Laterality: N/A;  . TONSILLECTOMY      Social History   Socioeconomic History  . Marital status: Married    Spouse name: Olin Hauser  . Number of children: 3  . Years of education: Not on file  . Highest education level: Not on file  Occupational History  . Occupation: [employer]  Tobacco Use  . Smoking status: Former Smoker    Packs/day: 1.00    Years: 28.00    Pack years: 28.00    Quit date: 05/04/2005    Years since quitting: 14.5  . Smokeless tobacco: Never Used  Substance and Sexual Activity  . Alcohol use: Not Currently    Alcohol/week: 0.0 standard drinks    Comment: very seldom  . Drug use: Not Currently    Types: Marijuana    Comment: denied 10/28/19  . Sexual activity: Not on file  Other Topics Concern  . Not on file  Social History Narrative  . Not on file   Social Determinants of Health    Financial Resource Strain: Low Risk   . Difficulty of Paying Living Expenses: Not hard at all  Food Insecurity: No Food Insecurity  . Worried About Charity fundraiser in the Last Year: Never true  . Ran Out of Food in the Last Year: Never true  Transportation Needs: No Transportation Needs  . Lack of Transportation (Medical): No  . Lack of Transportation (Non-Medical): No  Physical Activity: Insufficiently Active  . Days of Exercise per Week: 3 days  . Minutes of Exercise per Session: 30 min  Stress: No Stress Concern Present  . Feeling of Stress : Only a little  Social Connections: Moderately Integrated  . Frequency of Communication with Friends and Family: More than three times a week  . Frequency of Social Gatherings with Friends and Family: More than three times a week  . Attends Religious Services: 1 to 4 times per year  . Active Member of Clubs or Organizations: No  . Attends Archivist Meetings: Never  . Marital Status: Married    Family  History  Problem Relation Age of Onset  . Colon cancer Other        possibly 2 uncles  . Stroke Mother   . Aneurysm Mother   . Hypertension Mother   . Heart attack Father   . Hypertension Sister   . Hypertension Brother   . Hypertension Daughter     Outpatient Encounter Medications as of 12/03/2019  Medication Sig  . olmesartan-hydrochlorothiazide (BENICAR HCT) 40-25 MG tablet Take 1 tablet by mouth daily.  Marland Kitchen aspirin 81 MG tablet Take 81 mg by mouth daily.  Marland Kitchen DILT-XR 180 MG 24 hr capsule Take 180 mg by mouth daily.  . insulin aspart (NOVOLOG) 100 UNIT/ML FlexPen Inject 6-12 Units into the skin 3 (three) times daily with meals.  . lubiprostone (AMITIZA) 24 MCG capsule Take one to two times daily with food for constipation  . TRESIBA FLEXTOUCH 200 UNIT/ML SOPN Inject 30 Units into the skin at bedtime.  . [DISCONTINUED] pantoprazole (PROTONIX) 40 MG tablet Take 1 tablet (40 mg total) by mouth daily before breakfast.  .  [DISCONTINUED] Plecanatide (TRULANCE) 3 MG TABS Take 3 mg by mouth daily.  . [DISCONTINUED] tadalafil (CIALIS) 10 MG tablet Take 10 mg by mouth daily as needed for erectile dysfunction. (Patient not taking: Reported on 11/16/2019)   No facility-administered encounter medications on file as of 12/03/2019.    ALLERGIES: No Known Allergies  VACCINATION STATUS: Immunization History  Administered Date(s) Administered  . Influenza,inj,Quad PF,6+ Mos 03/24/2015  . Moderna SARS-COVID-2 Vaccination 06/16/2019, 07/14/2019  . Pneumococcal Polysaccharide-23 03/24/2015    Diabetes He presents for his initial diabetic visit. He has type 2 diabetes mellitus. Onset time: He was diagnosed at approximate age of 64 years. Disease course: This patient was seen prior to 2017 at which time his A1c was 6.1%.  He disappeared from care, returns with loss of control with A1c of>14.4%. There are no hypoglycemic associated symptoms. Pertinent negatives for hypoglycemia include no confusion, headaches, pallor or seizures. Associated symptoms include blurred vision, polydipsia and polyuria. Pertinent negatives for diabetes include no chest pain, no fatigue, no polyphagia and no weakness. There are no hypoglycemic complications. Symptoms are worsening. There are no diabetic complications. Risk factors for coronary artery disease include diabetes mellitus, hypertension, family history, male sex and tobacco exposure. Current diabetic treatment includes insulin injections. His weight is fluctuating minimally. He is following a generally unhealthy diet. When asked about meal planning, he reported none. He has not had a previous visit with a dietitian. He rarely participates in exercise. (He did not bring any logs nor meter to review.  His previsit labs show A1c of >14%.) An ACE inhibitor/angiotensin II receptor blocker is being taken. Eye exam is not current.  Hypertension This is a chronic problem. The current episode started more  than 1 year ago. Associated symptoms include blurred vision. Pertinent negatives include no chest pain, headaches, neck pain, palpitations or shortness of breath. Risk factors for coronary artery disease include diabetes mellitus, male gender, smoking/tobacco exposure and family history. Past treatments include angiotensin blockers.     Review of Systems  Constitutional: Negative for chills, fatigue, fever and unexpected weight change.  HENT: Negative for dental problem, mouth sores and trouble swallowing.   Eyes: Positive for blurred vision. Negative for visual disturbance.  Respiratory: Negative for cough, choking, chest tightness, shortness of breath and wheezing.   Cardiovascular: Negative for chest pain, palpitations and leg swelling.  Gastrointestinal: Negative for abdominal distention, abdominal pain, constipation, diarrhea, nausea and vomiting.  Endocrine: Positive for polydipsia and polyuria. Negative for polyphagia.  Genitourinary: Negative for dysuria, flank pain, hematuria and urgency.  Musculoskeletal: Negative for back pain, gait problem, myalgias and neck pain.  Skin: Negative for pallor, rash and wound.  Neurological: Negative for seizures, syncope, weakness, numbness and headaches.  Psychiatric/Behavioral: Negative for confusion and dysphoric mood.    Objective:    Vitals with BMI 12/03/2019 11/16/2019 10/28/2019  Height 5\' 10"  - 5\' 10"   Weight 182 lbs 3 oz 182 lbs 11 oz 176 lbs 13 oz  BMI 26.14 61.60 73.71  Systolic 062 694 854  Diastolic 86 76 81  Pulse 72 81 101    BP 138/86   Pulse 72   Ht 5\' 10"  (1.778 m)   Wt 182 lb 3.2 oz (82.6 kg)   BMI 26.14 kg/m   Wt Readings from Last 3 Encounters:  12/03/19 182 lb 3.2 oz (82.6 kg)  11/16/19 182 lb 11.2 oz (82.9 kg)  10/28/19 176 lb 12.8 oz (80.2 kg)     Physical Exam Constitutional:      General: He is not in acute distress.    Appearance: He is well-developed.  HENT:     Head: Normocephalic and atraumatic.   Neck:     Thyroid: No thyromegaly.     Trachea: No tracheal deviation.  Cardiovascular:     Rate and Rhythm: Normal rate.     Pulses:          Dorsalis pedis pulses are 1+ on the right side and 1+ on the left side.       Posterior tibial pulses are 1+ on the right side and 1+ on the left side.     Heart sounds: Normal heart sounds, S1 normal and S2 normal. No murmur heard.  No gallop.   Pulmonary:     Effort: Pulmonary effort is normal. No respiratory distress.     Breath sounds: No wheezing.  Abdominal:     General: There is no distension.     Tenderness: There is no abdominal tenderness. There is no guarding.  Musculoskeletal:     Right shoulder: No swelling or deformity.     Cervical back: Normal range of motion and neck supple.  Skin:    General: Skin is warm and dry.     Findings: No rash.     Nails: There is no clubbing.  Neurological:     Mental Status: He is alert and oriented to person, place, and time.     Cranial Nerves: No cranial nerve deficit.     Sensory: No sensory deficit.     Gait: Gait normal.     Deep Tendon Reflexes: Reflexes are normal and symmetric.  Psychiatric:        Speech: Speech normal.        Behavior: Behavior normal. Behavior is cooperative.        Thought Content: Thought content normal.        Judgment: Judgment normal.       CMP ( most recent) CMP     Component Value Date/Time   NA 133 (L) 10/12/2019 1322   K 3.3 (L) 10/12/2019 1322   CL 96 (L) 10/12/2019 1322   CO2 23 10/12/2019 1322   GLUCOSE 225 (H) 10/12/2019 1322   BUN 22 (A) 10/21/2019 0000   CREATININE 1.3 10/21/2019 0000   CREATININE 0.87 10/12/2019 1322   CREATININE 0.89 10/26/2015 0758   CALCIUM 9.4 10/12/2019 1322   PROT 8.0 10/12/2019 1322  ALBUMIN 4.4 10/12/2019 1322   AST 19 10/12/2019 1322   ALT 25 10/12/2019 1322   ALKPHOS 70 10/12/2019 1322   BILITOT 1.5 (H) 10/12/2019 1322   GFRNONAA 61 10/21/2019 0000   GFRAA 70 10/21/2019 0000     Diabetic Labs  (most recent): Lab Results  Component Value Date   HGBA1C >14 10/21/2019   HGBA1C 6.1 (H) 10/26/2015   HGBA1C 14 12/14/2014     Lipid Panel ( most recent) Lipid Panel     Component Value Date/Time   CHOL 171 10/21/2019 0000   TRIG 151 10/21/2019 0000   HDL 37 10/21/2019 0000   CHOLHDL 3.0 10/26/2015 0758   VLDL 16 10/26/2015 0758   LDLCALC 107 10/21/2019 0000      Lab Results  Component Value Date   TSH 1.62 10/21/2019   TSH 1.23 10/26/2015   FREET4 1.1 10/26/2015     Assessment & Plan:   1. Uncontrolled type 2 diabetes mellitus with hyperglycemia ([place])  - [place] has currently uncontrolled symptomatic type 2 DM since  64 years of age,  with most recent A1c of >14 %. Recent labs reviewed. -The patient was seen in the clinic prior to 2017 at which time his A1c was 6.1%, however, unfortunately he disappeared from care. - I had a long discussion with him about the progressive nature of diabetes and the pathology behind its complications. -his diabetes is complicated by nonfollow-up and he remains at a high risk for more acute and chronic complications which include CAD, CVA, CKD, retinopathy, and neuropathy. These are all discussed in detail with him.  - I have counseled him on diet  and weight management  by adopting a carbohydrate restricted/protein rich diet. Patient is encouraged to switch to  unprocessed or minimally processed     complex starch and increased protein intake (animal or plant source), fruits, and vegetables. -  he is advised to stick to a routine mealtimes to eat 3 meals  a day and avoid unnecessary snacks ( to snack only to correct hypoglycemia).   - he admits that there is a room for improvement in his food and drink choices. - Suggestion is made for him to avoid simple carbohydrates  from his diet including Cakes, Sweet Desserts, Ice Cream, Soda (diet and regular), Sweet Tea, Candies, Chips, Cookies, Store Bought Juices, Alcohol in Excess of  1-2  drinks a day, Artificial Sweeteners,  Coffee Creamer, and "Sugar-free" Products. This will help patient to have more stable blood glucose profile and potentially avoid unintended weight gain.  - he will be scheduled with Jearld Fenton, RDN, CDE for diabetes education.  - I have approached him with the following individualized plan to manage  his diabetes and patient agrees:  -His most recent A1c is greater than 14%, prior measurements include A1c of 12.2%, and 4% between 2018-2020. -In light of his chronic glycemic burden, will need intensive treatment with basal/bolus insulin in order for him to achieve control of diabetes to target. -Accordingly, he is approached to start Tresiba 30 units nightly, discussed and initiated NovoLog 6 units 3 times a day with meals  for pre-meal BG readings of 90-150mg /dl, plus patient specific correction dose for unexpected hyperglycemia above 150mg /dl, associated with strict monitoring of glucose 4 times a day-before meals and at bedtime. - he is warned not to take insulin without proper monitoring per orders. - Adjustment parameters are given to him for hypo and hyperglycemia in writing. - he is encouraged to  call clinic for blood glucose levels less than 70 or above 300 mg /dl.  - he is not a candidate for incretin therapy due to body habitus.  -Will be considered for low-dose platelet count subsequent visit. -He will need more work-up to classify his diabetes properly, and the possibility of pancreatic diabetes or LAD in this patient.  - Specific targets for  A1c;  LDL, HDL,  and Triglycerides were discussed with the patient.  2) Blood Pressure /Hypertension:  his blood pressure is  controlled to target.   he is advised to continue his current medications including Benicar 40/25 mg p.o. daily with breakfast . 3) Lipids/Hyperlipidemia:   Review of his recent lipid panel showed uncontrolled  LDL at 107 .  he  Is not on statins.  Will be considered on next visit.     4)  Weight/Diet:  Body mass index is 26.14 kg/m.  -      he is not a candidate for weight loss.   Exercise, and detailed carbohydrates information provided  -  detailed on discharge instructions.  5) Chronic Care/Health Maintenance:  -he  is on ARB medications and  is encouraged to initiate and continue to follow up with Ophthalmology, Dentist, referred to ophthalmology, podiatrist at least yearly or according to recommendations, and advised to   stay away from smoking. I have recommended yearly flu vaccine and pneumonia vaccine at least every 5 years; moderate intensity exercise for up to 150 minutes weekly; and  sleep for at least 7 hours a day.  - he is  advised to maintain close follow up with [facility] for primary care needs, as well as his other providers for optimal and coordinated care.   - Time spent in this patient care: 60 min, of which > 50% was spent in  counseling  him about his chronically uncontrolled type 2 diabetes, hypertension, hyperlipidemia and the rest reviewing his blood glucose logs , discussing his hypoglycemia and hyperglycemia episodes, reviewing his current and  previous labs / studies  ( including abstraction from other facilities) and medications  doses and developing a  long term treatment plan based on the latest standards of care/ guidelines; and documenting his care.    Please refer to Patient Instructions for Blood Glucose Monitoring and Insulin/Medications Dosing Guide"  in media tab for additional information. Please  also refer to " Patient Self Inventory" in the Media  tab for reviewed elements of pertinent patient history.  Travis Sharp participated in the discussions, expressed understanding, and voiced agreement with the above plans.  All questions were answered to his satisfaction. he is encouraged to contact clinic should he have any questions or concerns prior to his return visit.   Follow up plan: - Return in about 1 week  (around 12/10/2019) for F/U with Meter and Logs Only - no Labs.  Glade Lloyd, MD [facility] [address] [place], [place] [address] Phone: [phone]  Fax: [phone]    12/03/2019, 12:37 PM  This note was partially dictated with voice recognition software. Similar sounding words can be transcribed inadequately or may not  be corrected upon review.

## 2019-12-07 ENCOUNTER — Ambulatory Visit: Payer: BC Managed Care – PPO | Admitting: Gastroenterology

## 2019-12-07 NOTE — Telephone Encounter (Signed)
Pharmacy sent request for PA for trulance, PA done and approved by insurance. Pharmacy informed.

## 2019-12-11 ENCOUNTER — Ambulatory Visit: Payer: BC Managed Care – PPO | Admitting: "Endocrinology

## 2019-12-14 ENCOUNTER — Telehealth: Payer: Self-pay | Admitting: "Endocrinology

## 2019-12-14 ENCOUNTER — Other Ambulatory Visit: Payer: Self-pay

## 2019-12-14 ENCOUNTER — Ambulatory Visit: Payer: BC Managed Care – PPO | Admitting: "Endocrinology

## 2019-12-14 DIAGNOSIS — E1165 Type 2 diabetes mellitus with hyperglycemia: Secondary | ICD-10-CM

## 2019-12-14 MED ORDER — GLUCOSE BLOOD VI STRP: ORAL_STRIP | 5 refills | Status: DC

## 2019-12-14 NOTE — Telephone Encounter (Signed)
Pt was to come back today for a 7 da fu with logs-he has not had strips and needs some sent in. Accu Chek Avia test strips. Please send to [facility].

## 2019-12-14 NOTE — Telephone Encounter (Signed)
Sent in

## 2019-12-25 ENCOUNTER — Encounter: Payer: Self-pay | Admitting: "Endocrinology

## 2019-12-25 ENCOUNTER — Other Ambulatory Visit: Payer: Self-pay

## 2019-12-25 ENCOUNTER — Ambulatory Visit (INDEPENDENT_AMBULATORY_CARE_PROVIDER_SITE_OTHER): Payer: BC Managed Care – PPO | Admitting: "Endocrinology

## 2019-12-25 VITALS — BP 155/97 | HR 67 | Ht 70.0 in | Wt 183.6 lb

## 2019-12-25 DIAGNOSIS — E782 Mixed hyperlipidemia: Secondary | ICD-10-CM | POA: Diagnosis not present

## 2019-12-25 DIAGNOSIS — I10 Essential (primary) hypertension: Secondary | ICD-10-CM

## 2019-12-25 DIAGNOSIS — E1165 Type 2 diabetes mellitus with hyperglycemia: Secondary | ICD-10-CM | POA: Diagnosis not present

## 2019-12-25 MED ORDER — ACCU-CHEK AVIVA DEVI: 0 refills | Status: DC

## 2019-12-25 NOTE — Patient Instructions (Signed)

## 2019-12-25 NOTE — Progress Notes (Signed)
12/25/2019, 12:51 PM   Endocrinology follow-up note  Subjective:    Patient ID: Travis Sharp, male    DOB: 09-Sep-1955.  Travis Sharp is being seen in follow-up after he was seen in consultation for management of currently uncontrolled symptomatic diabetes requested by  Morgan Jamen, [facility].   Past Medical History:  Diagnosis Date  . Colitis 03/23/2015  . Diabetes mellitus   . Gastritis   . Hypertension     Past Surgical History:  Procedure Laterality Date  . BIOPSY  05/10/2015   Procedure: BIOPSY;  Surgeon: Danie Binder, MD;  Location: [facility];  Service: Endoscopy;;  gastric bx's  . COLONOSCOPY WITH PROPOFOL N/A 05/10/2015   dr. Oneida Alar: Mild diverticulosis, hemorrhoids.  Next colonoscopy 10 years  . ESOPHAGOGASTRODUODENOSCOPY (EGD) WITH PROPOFOL N/A 05/10/2015   Dr. Oneida Alar: dysphagai due to stricture at [facility] s/p dilation, mild nonerosive gastritis.   Marland Kitchen SAVORY DILATION N/A 05/10/2015   Procedure: SAVORY DILATION;  Surgeon: Danie Binder, MD;  Location: [facility];  Service: Endoscopy;  Laterality: N/A;  . TONSILLECTOMY      Social History   Socioeconomic History  . Marital status: Married    Spouse name: Olin Hauser  . Number of children: 3  . Years of education: Not on file  . Highest education level: Not on file  Occupational History  . Occupation: [employer]  Tobacco Use  . Smoking status: Former Smoker    Packs/day: 1.00    Years: 28.00    Pack years: 28.00    Quit date: 05/04/2005    Years since quitting: 14.6  . Smokeless tobacco: Never Used  Substance and Sexual Activity  . Alcohol use: Not Currently    Alcohol/week: 0.0 standard drinks    Comment: very seldom  . Drug use: Not Currently    Types: Marijuana    Comment: denied 10/28/19  . Sexual activity: Not on file  Other Topics Concern  . Not on file  Social History Narrative  . Not on file   Social Determinants of Health   Financial Resource Strain:  Low Risk   . Difficulty of Paying Living Expenses: Not hard at all  Food Insecurity: No Food Insecurity  . Worried About Charity fundraiser in the Last Year: Never true  . Ran Out of Food in the Last Year: Never true  Transportation Needs: No Transportation Needs  . Lack of Transportation (Medical): No  . Lack of Transportation (Non-Medical): No  Physical Activity: Insufficiently Active  . Days of Exercise per Week: 3 days  . Minutes of Exercise per Session: 30 min  Stress: No Stress Concern Present  . Feeling of Stress : Only a little  Social Connections: Moderately Integrated  . Frequency of Communication with Friends and Family: More than three times a week  . Frequency of Social Gatherings with Friends and Family: More than three times a week  . Attends Religious Services: 1 to 4 times per year  . Active Member of Clubs or Organizations: No  . Attends Archivist Meetings: Never  . Marital Status: Married    Family History  Problem Relation Age of Onset  . Colon cancer Other        possibly 2 uncles  . Stroke Mother   . Aneurysm Mother   . Hypertension Mother   . Heart attack Father   . Hypertension Sister   . Hypertension Brother   .  Hypertension Daughter     Outpatient Encounter Medications as of 12/25/2019  Medication Sig  . aspirin 81 MG tablet Take 81 mg by mouth daily.  . Blood Glucose Monitoring Suppl (ACCU-CHEK AVIVA) device Use as instructed  . glucose blood test strip Use as instructed to test blood glucose four times daily, pt using Accu-chek Aviva  . insulin aspart (NOVOLOG) 100 UNIT/ML FlexPen Inject 6-12 Units into the skin 3 (three) times daily with meals.  . lubiprostone (AMITIZA) 24 MCG capsule Take one to two times daily with food for constipation  . olmesartan-hydrochlorothiazide (BENICAR HCT) 40-25 MG tablet Take 1 tablet by mouth daily.  . TRESIBA FLEXTOUCH 200 UNIT/ML SOPN Inject 30 Units into the skin at bedtime.  . [DISCONTINUED]  DILT-XR 180 MG 24 hr capsule Take 180 mg by mouth daily.   No facility-administered encounter medications on file as of 12/25/2019.    ALLERGIES: No Known Allergies  VACCINATION STATUS: Immunization History  Administered Date(s) Administered  . Influenza,inj,Quad PF,6+ Mos 03/24/2015  . Moderna SARS-COVID-2 Vaccination 06/16/2019, 07/14/2019  . Pneumococcal Polysaccharide-23 03/24/2015    Diabetes He presents for his follow-up diabetic visit. He has type 2 diabetes mellitus. Onset time: He was diagnosed at approximate age of 84 years. His disease course has been worsening (This patient was seen prior to 2017 at which time his A1c was 6.1%.  He disappeared from care, returns with loss of control with A1c of>14.4%.). There are no hypoglycemic associated symptoms. Pertinent negatives for hypoglycemia include no confusion, headaches, pallor or seizures. Associated symptoms include blurred vision, polydipsia and polyuria. Pertinent negatives for diabetes include no chest pain, no fatigue, no polyphagia and no weakness. There are no hypoglycemic complications. Symptoms are worsening. There are no diabetic complications. Risk factors for coronary artery disease include diabetes mellitus, hypertension, family history, male sex and tobacco exposure. Current diabetic treatment includes insulin injections. His weight is fluctuating minimally. He is following a generally unhealthy diet. When asked about meal planning, he reported none. He has not had a previous visit with a dietitian. He rarely participates in exercise. (He brought in a meter of which has 30 readings the last 7 days averaging 141, 146 for the last 30 days of 52 readings, however in sharp disagreement with his recent A1c of  >14%.  He insists that this is his meter and his readings.  He did not bring any logs.) An ACE inhibitor/angiotensin II receptor blocker is being taken. Eye exam is not current.  Hypertension This is a chronic problem. The  current episode started more than 1 year ago. Associated symptoms include blurred vision. Pertinent negatives include no chest pain, headaches, neck pain, palpitations or shortness of breath. Risk factors for coronary artery disease include diabetes mellitus, male gender, smoking/tobacco exposure and family history. Past treatments include angiotensin blockers.     Review of Systems  Constitutional: Negative for chills, fatigue, fever and unexpected weight change.  HENT: Negative for dental problem, mouth sores and trouble swallowing.   Eyes: Positive for blurred vision. Negative for visual disturbance.  Respiratory: Negative for cough, choking, chest tightness, shortness of breath and wheezing.   Cardiovascular: Negative for chest pain, palpitations and leg swelling.  Gastrointestinal: Negative for abdominal distention, abdominal pain, constipation, diarrhea, nausea and vomiting.  Endocrine: Positive for polydipsia and polyuria. Negative for polyphagia.  Genitourinary: Negative for dysuria, flank pain, hematuria and urgency.  Musculoskeletal: Negative for back pain, gait problem, myalgias and neck pain.  Skin: Negative for pallor, rash and wound.  Neurological: Negative for seizures, syncope, weakness, numbness and headaches.  Psychiatric/Behavioral: Negative for confusion and dysphoric mood.    Objective:    Vitals with BMI 12/25/2019 12/03/2019 11/16/2019  Height 5\' 10"  5\' 10"  -  Weight 183 lbs 10 oz 182 lbs 3 oz 182 lbs 11 oz  BMI 26.34 40.98 11.91  Systolic 478 295 621  Diastolic 97 86 76  Pulse 67 72 81    BP (!) 155/97 Comment: pt has not taken his medication this morning  Pulse 67   Ht 5\' 10"  (1.778 m)   Wt 183 lb 9.6 oz (83.3 kg)   BMI 26.34 kg/m   Wt Readings from Last 3 Encounters:  12/25/19 183 lb 9.6 oz (83.3 kg)  12/03/19 182 lb 3.2 oz (82.6 kg)  11/16/19 182 lb 11.2 oz (82.9 kg)     Physical Exam Constitutional:      General: He is not in acute distress.     Appearance: He is well-developed.  HENT:     Head: Normocephalic and atraumatic.  Neck:     Thyroid: No thyromegaly.     Trachea: No tracheal deviation.  Cardiovascular:     Rate and Rhythm: Normal rate.     Pulses:          Dorsalis pedis pulses are 1+ on the right side and 1+ on the left side.       Posterior tibial pulses are 1+ on the right side and 1+ on the left side.     Heart sounds: Normal heart sounds, S1 normal and S2 normal. No murmur heard.  No gallop.   Pulmonary:     Effort: Pulmonary effort is normal. No respiratory distress.     Breath sounds: No wheezing.  Abdominal:     General: There is no distension.     Tenderness: There is no abdominal tenderness. There is no guarding.  Musculoskeletal:     Right shoulder: No swelling or deformity.     Cervical back: Normal range of motion and neck supple.  Skin:    General: Skin is warm and dry.     Findings: No rash.     Nails: There is no clubbing.  Neurological:     Mental Status: He is alert and oriented to person, place, and time.     Cranial Nerves: No cranial nerve deficit.     Sensory: No sensory deficit.     Gait: Gait normal.     Deep Tendon Reflexes: Reflexes are normal and symmetric.  Psychiatric:        Speech: Speech normal.        Behavior: Behavior normal. Behavior is cooperative.        Thought Content: Thought content normal.        Judgment: Judgment normal.       CMP ( most recent) CMP     Component Value Date/Time   NA 133 (L) 10/12/2019 1322   K 3.3 (L) 10/12/2019 1322   CL 96 (L) 10/12/2019 1322   CO2 23 10/12/2019 1322   GLUCOSE 225 (H) 10/12/2019 1322   BUN 22 (A) 10/21/2019 0000   CREATININE 1.3 10/21/2019 0000   CREATININE 0.87 10/12/2019 1322   CREATININE 0.89 10/26/2015 0758   CALCIUM 9.4 10/12/2019 1322   PROT 8.0 10/12/2019 1322   ALBUMIN 4.4 10/12/2019 1322   AST 19 10/12/2019 1322   ALT 25 10/12/2019 1322   ALKPHOS 70 10/12/2019 1322   BILITOT 1.5 (H) 10/12/2019 1322  GFRNONAA 61 10/21/2019 0000   GFRAA 70 10/21/2019 0000     Diabetic Labs (most recent): Lab Results  Component Value Date   HGBA1C >14 10/21/2019   HGBA1C 6.1 (H) 10/26/2015   HGBA1C 14 12/14/2014     Lipid Panel ( most recent) Lipid Panel     Component Value Date/Time   CHOL 171 10/21/2019 0000   TRIG 151 10/21/2019 0000   HDL 37 10/21/2019 0000   CHOLHDL 3.0 10/26/2015 0758   VLDL 16 10/26/2015 0758   LDLCALC 107 10/21/2019 0000      Lab Results  Component Value Date   TSH 1.62 10/21/2019   TSH 1.23 10/26/2015   FREET4 1.1 10/26/2015     Assessment & Plan:   1. Uncontrolled type 2 diabetes mellitus with hyperglycemia ([place])  - [place] has currently uncontrolled symptomatic type 2 DM since  64 years of age. He brought in a meter of which has 30 readings the last 7 days averaging 141, 146 for the last 30 days of 52 readings, however in sharp disagreement with his recent A1c of  >14%.  He insists that this is his meter and his readings.  He did not bring any logs.  But then, he states he does not monitor more than twice a day.  This makes it even more suspicious.    Recent labs reviewed. -The patient was seen in the clinic prior to 2017 at which time his A1c was 6.1%, however, unfortunately he disappeared from care. - I had a long discussion with him about the progressive nature of diabetes and the pathology behind its complications. -his diabetes is complicated by nonfollow-up and he remains at a high risk for more acute and chronic complications which include CAD, CVA, CKD, retinopathy, and neuropathy. These are all discussed in detail with him.  - I have counseled him on diet  and weight management  by adopting a carbohydrate restricted/protein rich diet. Patient is encouraged to switch to  unprocessed or minimally processed     complex starch and increased protein intake (animal or plant source), fruits, and vegetables. -  he is advised to stick to a routine  mealtimes to eat 3 meals  a day and avoid unnecessary snacks ( to snack only to correct hypoglycemia).  - he  admits there is a room for improvement in his diet and drink choices. -  Suggestion is made for him to avoid simple carbohydrates  from his diet including Cakes, Sweet Desserts / Pastries, Ice Cream, Soda (diet and regular), Sweet Tea, Candies, Chips, Cookies, Sweet Pastries,  Store Bought Juices, Alcohol in Excess of  1-2 drinks a day, Artificial Sweeteners, Coffee Creamer, and "Sugar-free" Products. This will help patient to have stable blood glucose profile and potentially avoid unintended weight gain.   - he will be scheduled with Jearld Fenton, RDN, CDE for diabetes education.  - I have approached him with the following individualized plan to manage  his diabetes and patient agrees:  -His most recent A1c is greater than 14%, prior measurements include A1c of 12.2%, and 4% between 2018-2020. -His meter is suspicious and his readings do not agree with his A1c.  I ordered a new meter for him, Accu-Chek Aviva to use his strips which he bought recently. -In light of his chronic glycemic burden, he will need intensive treatment with basal/bolus insulin in order for him to achieve control of diabetes to target. -Accordingly, he is advised to start  Tresiba 30 units  nightly, NovoLog 6 units 3 times a day with meals  for pre-meal BG readings of 90-150mg /dl, plus patient specific correction dose for unexpected hyperglycemia above 150mg /dl, associated with strict monitoring of glucose 4 times a day-before meals and at bedtime. - he is warned not to take insulin without proper monitoring per orders. - Adjustment parameters are given to him for hypo and hyperglycemia in writing. - he is encouraged to call clinic for blood glucose levels less than 70 or above 300 mg /dl.  - he is not a candidate for incretin therapy due to body habitus.  -Will be considered for low-dose platelet count subsequent  visit. -He will need more work-up to classify his diabetes properly, and the possibility of pancreatic diabetes or LAD in this patient.  - Specific targets for  A1c;  LDL, HDL,  and Triglycerides were discussed with the patient.  2) Blood Pressure /Hypertension: His blood pressure is not controlled to target.  He is coming off of a night shift. he is advised to continue his current medications including Benicar 40/25 mg p.o. daily with breakfast . 3) Lipids/Hyperlipidemia:   Review of his recent lipid panel showed uncontrolled  LDL at 107 .  he  Is not on statins.  Will be considered on next visit.    4)  Weight/Diet:  Body mass index is 26.34 kg/m.  -      he is not a candidate for weight loss.   Exercise, and detailed carbohydrates information provided  -  detailed on discharge instructions.  5) Chronic Care/Health Maintenance:  -he  is on ARB medications and  is encouraged to initiate and continue to follow up with Ophthalmology, Dentist, referred to ophthalmology, podiatrist at least yearly or according to recommendations, and advised to   stay away from smoking. I have recommended yearly flu vaccine and pneumonia vaccine at least every 5 years; moderate intensity exercise for up to 150 minutes weekly; and  sleep for at least 7 hours a day.  - he is  advised to maintain close follow up with [facility] for primary care needs, as well as his other providers for optimal and coordinated care.   - Time spent on this patient care encounter:  35 min, of which > 50% was spent in  counseling and the rest reviewing his blood glucose logs , discussing his hypoglycemia and hyperglycemia episodes, reviewing his current and  previous labs / studies  ( including abstraction from other facilities) and medications  doses and developing a  long term treatment plan and documenting his care.   Please refer to Patient Instructions for Blood Glucose Monitoring and Insulin/Medications Dosing  Guide"  in media tab for additional information. Please  also refer to " Patient Self Inventory" in the Media  tab for reviewed elements of pertinent patient history.  Travis Junes Kaufmann participated in the discussions, expressed understanding, and voiced agreement with the above plans.  All questions were answered to his satisfaction. he is encouraged to contact clinic should he have any questions or concerns prior to his return visit.   Follow up plan: - Return in about 5 weeks (around 01/29/2020) for Bring Meter and Logs- A1c in Office.  Glade Lloyd, MD [facility] [address] [place], [place] [address] Phone: [phone]  Fax: [phone]    12/25/2019, 12:51 PM  This note was partially dictated with voice recognition software. Similar sounding words can be transcribed inadequately or may not  be corrected upon review.

## 2020-01-18 ENCOUNTER — Encounter: Payer: Self-pay | Admitting: Internal Medicine

## 2020-01-29 ENCOUNTER — Ambulatory Visit: Payer: BC Managed Care – PPO | Admitting: "Endocrinology

## 2020-01-29 NOTE — Telephone Encounter (Signed)
Pt did not receive meter until 9/27 will come for an appt Monday. He wanted me to let you know.

## 2020-01-29 NOTE — Telephone Encounter (Signed)
Noted. Thanks.

## 2020-02-01 ENCOUNTER — Ambulatory Visit (INDEPENDENT_AMBULATORY_CARE_PROVIDER_SITE_OTHER): Payer: BC Managed Care – PPO | Admitting: "Endocrinology

## 2020-02-01 ENCOUNTER — Other Ambulatory Visit: Payer: Self-pay

## 2020-02-01 ENCOUNTER — Encounter: Payer: Self-pay | Admitting: "Endocrinology

## 2020-02-01 VITALS — BP 144/92 | HR 80 | Ht 70.0 in | Wt 188.6 lb

## 2020-02-01 DIAGNOSIS — E1165 Type 2 diabetes mellitus with hyperglycemia: Secondary | ICD-10-CM

## 2020-02-01 LAB — POCT GLYCOSYLATED HEMOGLOBIN (HGB A1C): Hemoglobin A1C: 7.2 % — AB (ref 4.0–5.6)

## 2020-02-01 MED ORDER — DEXCOM G6 SENSOR MISC: 4.0000 | 2 refills | Status: DC

## 2020-02-01 MED ORDER — DEXCOM G6 TRANSMITTER MISC: 1.0000 | 1 refills | Status: DC

## 2020-02-01 MED ORDER — DEXCOM G6 RECEIVER DEVI: 1.0000 | 0 refills | Status: DC | PRN

## 2020-02-01 MED ORDER — ACCU-CHEK GUIDE VI STRP: ORAL_STRIP | 2 refills | Status: AC

## 2020-02-01 NOTE — Patient Instructions (Signed)

## 2020-02-01 NOTE — Progress Notes (Signed)
02/01/2020, 12:54 PM   Endocrinology follow-up note  Subjective:    Patient ID: Travis Sharp, male    DOB: 1955-08-13.  Travis Sharp is being seen in follow-up after he was seen in consultation for management of currently uncontrolled symptomatic diabetes requested by  Morgan Justen, [facility].   Past Medical History:  Diagnosis Date  . Colitis 03/23/2015  . Diabetes mellitus   . Gastritis   . Hypertension     Past Surgical History:  Procedure Laterality Date  . BIOPSY  05/10/2015   Procedure: BIOPSY;  Surgeon: Danie Binder, MD;  Location: [facility];  Service: Endoscopy;;  gastric bx's  . COLONOSCOPY WITH PROPOFOL N/A 05/10/2015   dr. Oneida Alar: Mild diverticulosis, hemorrhoids.  Next colonoscopy 10 years  . ESOPHAGOGASTRODUODENOSCOPY (EGD) WITH PROPOFOL N/A 05/10/2015   Dr. Oneida Alar: dysphagai due to stricture at [facility] s/p dilation, mild nonerosive gastritis.   Marland Kitchen SAVORY DILATION N/A 05/10/2015   Procedure: SAVORY DILATION;  Surgeon: Danie Binder, MD;  Location: [facility];  Service: Endoscopy;  Laterality: N/A;  . TONSILLECTOMY      Social History   Socioeconomic History  . Marital status: Married    Spouse name: Olin Hauser  . Number of children: 3  . Years of education: Not on file  . Highest education level: Not on file  Occupational History  . Occupation: [employer]  Tobacco Use  . Smoking status: Former Smoker    Packs/day: 1.00    Years: 28.00    Pack years: 28.00    Quit date: 05/04/2005    Years since quitting: 14.7  . Smokeless tobacco: Never Used  Substance and Sexual Activity  . Alcohol use: Not Currently    Alcohol/week: 0.0 standard drinks    Comment: very seldom  . Drug use: Not Currently    Types: Marijuana    Comment: denied 10/28/19  . Sexual activity: Not on file  Other Topics Concern  . Not on file  Social History Narrative  . Not on file   Social Determinants of Health   Financial Resource Strain:  Low Risk   . Difficulty of Paying Living Expenses: Not hard at all  Food Insecurity: No Food Insecurity  . Worried About Charity fundraiser in the Last Year: Never true  . Ran Out of Food in the Last Year: Never true  Transportation Needs: No Transportation Needs  . Lack of Transportation (Medical): No  . Lack of Transportation (Non-Medical): No  Physical Activity: Insufficiently Active  . Days of Exercise per Week: 3 days  . Minutes of Exercise per Session: 30 min  Stress: No Stress Concern Present  . Feeling of Stress : Only a little  Social Connections: Moderately Integrated  . Frequency of Communication with Friends and Family: More than three times a week  . Frequency of Social Gatherings with Friends and Family: More than three times a week  . Attends Religious Services: 1 to 4 times per year  . Active Member of Clubs or Organizations: No  . Attends Archivist Meetings: Never  . Marital Status: Married    Family History  Problem Relation Age of Onset  . Colon cancer Other        possibly 2 uncles  . Stroke Mother   . Aneurysm Mother   . Hypertension Mother   . Heart attack Father   . Hypertension Sister   . Hypertension Brother   .  Hypertension Daughter     Outpatient Encounter Medications as of 02/01/2020  Medication Sig  . aspirin 81 MG tablet Take 81 mg by mouth daily.  . Continuous Blood Gluc Receiver (DEXCOM G6 RECEIVER) DEVI 1 Piece by Does not apply route as needed.  . Continuous Blood Gluc Sensor (DEXCOM G6 SENSOR) MISC 4 Pieces by Does not apply route once a week.  . Continuous Blood Gluc Transmit (DEXCOM G6 TRANSMITTER) MISC 1 Piece by Does not apply route as directed.  Marland Kitchen glucose blood (ACCU-CHEK GUIDE) test strip Use as instructed  . insulin aspart (NOVOLOG) 100 UNIT/ML FlexPen Inject 6-12 Units into the skin 3 (three) times daily with meals.  . lubiprostone (AMITIZA) 24 MCG capsule Take one to two times daily with food for constipation  .  olmesartan-hydrochlorothiazide (BENICAR HCT) 40-25 MG tablet Take 1 tablet by mouth daily.  . TRESIBA FLEXTOUCH 200 UNIT/ML SOPN Inject 30 Units into the skin at bedtime.  . [DISCONTINUED] Blood Glucose Monitoring Suppl (ACCU-CHEK AVIVA) device Use as instructed  . [DISCONTINUED] glucose blood test strip Use as instructed to test blood glucose four times daily, pt using Accu-chek Aviva   No facility-administered encounter medications on file as of 02/01/2020.    ALLERGIES: No Known Allergies  VACCINATION STATUS: Immunization History  Administered Date(s) Administered  . Influenza,inj,Quad PF,6+ Mos 03/24/2015  . Moderna SARS-COVID-2 Vaccination 06/16/2019, 07/14/2019  . Pneumococcal Polysaccharide-23 03/24/2015    Diabetes He presents for his follow-up diabetic visit. He has type 2 diabetes mellitus. Onset time: He was diagnosed at approximate age of 79 years. His disease course has been improving (This patient was seen prior to 2017 at which time his A1c was 6.1%.  He disappeared from care, returns with loss of control with A1c of>14.4%.). There are no hypoglycemic associated symptoms. Pertinent negatives for hypoglycemia include no confusion, headaches, pallor or seizures. Pertinent negatives for diabetes include no blurred vision, no chest pain, no fatigue, no polydipsia, no polyphagia, no polyuria and no weakness. There are no hypoglycemic complications. Symptoms are improving. There are no diabetic complications. Risk factors for coronary artery disease include diabetes mellitus, hypertension, family history, male sex and tobacco exposure. Current diabetic treatment includes insulin injections. His weight is increasing steadily. He is following a generally unhealthy diet. When asked about meal planning, he reported none. He has not had a previous visit with a dietitian. He rarely participates in exercise. His home blood glucose trend is decreasing steadily. His breakfast blood glucose range  is generally 140-180 mg/dl. His lunch blood glucose range is generally 140-180 mg/dl. His dinner blood glucose range is generally 140-180 mg/dl. His bedtime blood glucose range is generally 140-180 mg/dl. His overall blood glucose range is 140-180 mg/dl. (He brought in his logs showing improving glycemic profile and his point-of-care A1c today 7.2% sharply improving from   >14%.  He did not document any hypoglycemia.) An ACE inhibitor/angiotensin II receptor blocker is being taken. Eye exam is not current.  Hypertension This is a chronic problem. The current episode started more than 1 year ago. Pertinent negatives include no blurred vision, chest pain, headaches, neck pain, palpitations or shortness of breath. Risk factors for coronary artery disease include diabetes mellitus, male gender, smoking/tobacco exposure and family history. Past treatments include angiotensin blockers.     Review of Systems  Constitutional: Negative for chills, fatigue, fever and unexpected weight change.  HENT: Negative for dental problem, mouth sores and trouble swallowing.   Eyes: Negative for blurred vision and visual disturbance.  Respiratory: Negative for cough, choking, chest tightness, shortness of breath and wheezing.   Cardiovascular: Negative for chest pain, palpitations and leg swelling.  Gastrointestinal: Negative for abdominal distention, abdominal pain, constipation, diarrhea, nausea and vomiting.  Endocrine: Negative for polydipsia, polyphagia and polyuria.  Genitourinary: Negative for dysuria, flank pain, hematuria and urgency.  Musculoskeletal: Negative for back pain, gait problem, myalgias and neck pain.  Skin: Negative for pallor, rash and wound.  Neurological: Negative for seizures, syncope, weakness, numbness and headaches.  Psychiatric/Behavioral: Negative for confusion and dysphoric mood.    Objective:    Vitals with BMI 02/01/2020 12/25/2019 12/03/2019  Height 5\' 10"  5\' 10"  5\' 10"   Weight  188 lbs 10 oz 183 lbs 10 oz 182 lbs 3 oz  BMI 27.06 78.46 96.29  Systolic 528 413 244  Diastolic 92 97 86  Pulse 80 67 72    BP (!) 144/92   Pulse 80   Ht 5\' 10"  (1.778 m)   Wt 188 lb 9.6 oz (85.5 kg)   BMI 27.06 kg/m   Wt Readings from Last 3 Encounters:  02/01/20 188 lb 9.6 oz (85.5 kg)  12/25/19 183 lb 9.6 oz (83.3 kg)  12/03/19 182 lb 3.2 oz (82.6 kg)     Physical Exam Constitutional:      General: He is not in acute distress.    Appearance: He is well-developed.  HENT:     Head: Normocephalic and atraumatic.  Neck:     Thyroid: No thyromegaly.     Trachea: No tracheal deviation.  Cardiovascular:     Rate and Rhythm: Normal rate.     Pulses:          Dorsalis pedis pulses are 1+ on the right side and 1+ on the left side.       Posterior tibial pulses are 1+ on the right side and 1+ on the left side.     Heart sounds: Normal heart sounds, S1 normal and S2 normal. No murmur heard.  No gallop.   Pulmonary:     Effort: Pulmonary effort is normal. No respiratory distress.     Breath sounds: No wheezing.  Abdominal:     General: There is no distension.     Tenderness: There is no abdominal tenderness. There is no guarding.  Musculoskeletal:     Right shoulder: No swelling or deformity.     Cervical back: Normal range of motion and neck supple.  Skin:    General: Skin is warm and dry.     Findings: No rash.     Nails: There is no clubbing.  Neurological:     Mental Status: He is alert and oriented to person, place, and time.     Cranial Nerves: No cranial nerve deficit.     Sensory: No sensory deficit.     Gait: Gait normal.     Deep Tendon Reflexes: Reflexes are normal and symmetric.  Psychiatric:        Speech: Speech normal.        Behavior: Behavior normal. Behavior is cooperative.        Thought Content: Thought content normal.        Judgment: Judgment normal.       CMP ( most recent) CMP     Component Value Date/Time   NA 133 (L) 10/12/2019 1322    K 3.3 (L) 10/12/2019 1322   CL 96 (L) 10/12/2019 1322   CO2 23 10/12/2019 1322   GLUCOSE 225 (H) 10/12/2019 1322  BUN 22 (A) 10/21/2019 0000   CREATININE 1.3 10/21/2019 0000   CREATININE 0.87 10/12/2019 1322   CREATININE 0.89 10/26/2015 0758   CALCIUM 9.4 10/12/2019 1322   PROT 8.0 10/12/2019 1322   ALBUMIN 4.4 10/12/2019 1322   AST 19 10/12/2019 1322   ALT 25 10/12/2019 1322   ALKPHOS 70 10/12/2019 1322   BILITOT 1.5 (H) 10/12/2019 1322   GFRNONAA 61 10/21/2019 0000   GFRAA 70 10/21/2019 0000     Diabetic Labs (most recent): Lab Results  Component Value Date   HGBA1C 7.2 (A) 02/01/2020   HGBA1C >14 10/21/2019   HGBA1C 6.1 (H) 10/26/2015     Lipid Panel ( most recent) Lipid Panel     Component Value Date/Time   CHOL 171 10/21/2019 0000   TRIG 151 10/21/2019 0000   HDL 37 10/21/2019 0000   CHOLHDL 3.0 10/26/2015 0758   VLDL 16 10/26/2015 0758   LDLCALC 107 10/21/2019 0000      Lab Results  Component Value Date   TSH 1.62 10/21/2019   TSH 1.23 10/26/2015   FREET4 1.1 10/26/2015     Assessment & Plan:   1. Uncontrolled type 2 diabetes mellitus with hyperglycemia ([place])  - Charlotte has currently uncontrolled symptomatic type 2 DM since  63 years of age. He brought in his logs showing improving glycemic profile and his point-of-care A1c today 7.2% sharply improving from   >14%.  He did not document any hypoglycemia.    Recent labs reviewed. -The patient was seen in the clinic prior to 2017 at which time his A1c was 6.1%, however, unfortunately he disappeared from care. - I had a long discussion with him about the progressive nature of diabetes and the pathology behind its complications. -his diabetes is complicated by nonfollow-up and he remains at a high risk for more acute and chronic complications which include CAD, CVA, CKD, retinopathy, and neuropathy. These are all discussed in detail with him.  - I have counseled him on diet  and weight  management  by adopting a carbohydrate restricted/protein rich diet. Patient is encouraged to switch to  unprocessed or minimally processed     complex starch and increased protein intake (animal or plant source), fruits, and vegetables. -  he is advised to stick to a routine mealtimes to eat 3 meals  a day and avoid unnecessary snacks ( to snack only to correct hypoglycemia).   - he  admits there is a room for improvement in his diet and drink choices. -  Suggestion is made for him to avoid simple carbohydrates  from his diet including Cakes, Sweet Desserts / Pastries, Ice Cream, Soda (diet and regular), Sweet Tea, Candies, Chips, Cookies, Sweet Pastries,  Store Bought Juices, Alcohol in Excess of  1-2 drinks a day, Artificial Sweeteners, Coffee Creamer, and "Sugar-free" Products. This will help patient to have stable blood glucose profile and potentially avoid unintended weight gain.   - he will be scheduled with Jearld Fenton, RDN, CDE for diabetes education.  - I have approached him with the following individualized plan to manage  his diabetes and patient agrees:  -In light of his presentation with significant improvement in glycemic profile he will not need any higher dose of insulin. -However, he will continue to need intensive treatment until next measurement.  He is advised to continue Tresiba 30 units nightly, NovoLog 6 units 3 times a day with meals  for pre-meal BG readings of 90-150mg /dl, plus patient specific correction dose  for unexpected hyperglycemia above 150mg /dl, associated with strict monitoring of glucose 4 times a day-before meals and at bedtime. - he is warned not to take insulin without proper monitoring per orders. - Adjustment parameters are given to him for hypo and hyperglycemia in writing. - he is encouraged to call clinic for blood glucose levels less than 70 or above 300 mg /dl.  - he is not a candidate for incretin therapy due to body habitus.  -He will need more  work-up to classify his diabetes properly, and the possibility of pancreatic diabetes or LAD in this patient.  Anti-GAD and antiislet antibodies are ordered for him to do before his next visit.  - Specific targets for  A1c;  LDL, HDL,  and Triglycerides were discussed with the patient.  2) Blood Pressure /Hypertension: His blood pressure is not controlled to target.  He is coming off of a night shift. he is advised to continue his current medications including Benicar 40/25 mg p.o. daily with breakfast . 3) Lipids/Hyperlipidemia:   Review of his recent lipid panel showed uncontrolled  LDL at 107 .  he  Is not on statins.  Will be considered on next visit.    4)  Weight/Diet:  Body mass index is 27.06 kg/m.  -      he is not a candidate for major weight loss.    Exercise, and detailed carbohydrates information provided  -  detailed on discharge instructions.  5) Chronic Care/Health Maintenance:  -he  is on ARB medications and  is encouraged to initiate and continue to follow up with Ophthalmology, Dentist, referred to ophthalmology, podiatrist at least yearly or according to recommendations, and advised to   stay away from smoking. I have recommended yearly flu vaccine and pneumonia vaccine at least every 5 years; moderate intensity exercise for up to 150 minutes weekly; and  sleep for at least 7 hours a day.  - he is  advised to maintain close follow up with [facility] for primary care needs, as well as his other providers for optimal and coordinated care.   - Time spent on this patient care encounter:  35 min, of which > 50% was spent in  counseling and the rest reviewing his blood glucose logs , discussing his hypoglycemia and hyperglycemia episodes, reviewing his current and  previous labs / studies  ( including abstraction from other facilities) and medications  doses and developing a  long term treatment plan and documenting his care.   Please refer to Patient  Instructions for Blood Glucose Monitoring and Insulin/Medications Dosing Guide"  in media tab for additional information. Please  also refer to " Patient Self Inventory" in the Media  tab for reviewed elements of pertinent patient history.  Travis Sharp participated in the discussions, expressed understanding, and voiced agreement with the above plans.  All questions were answered to his satisfaction. he is encouraged to contact clinic should he have any questions or concerns prior to his return visit.    Follow up plan: - Return in about 3 months (around 05/03/2020) for F/U with Pre-visit Labs, Meter, Logs, A1c here.Glade Lloyd, MD [facility] [address] [place], [place] [address] Phone: [phone]  Fax: [phone]    02/01/2020, 12:54 PM  This note was partially dictated with voice recognition software. Similar sounding words can be transcribed inadequately or may not  be corrected upon review.

## 2020-03-16 ENCOUNTER — Inpatient Hospital Stay (HOSPITAL_COMMUNITY): Payer: BC Managed Care – PPO | Attending: Hematology

## 2020-03-22 ENCOUNTER — Ambulatory Visit (HOSPITAL_COMMUNITY): Payer: BC Managed Care – PPO | Admitting: Hematology

## 2020-03-23 ENCOUNTER — Ambulatory Visit (HOSPITAL_COMMUNITY): Payer: BC Managed Care – PPO | Admitting: Hematology and Oncology

## 2020-05-03 ENCOUNTER — Ambulatory Visit: Payer: BC Managed Care – PPO | Admitting: "Endocrinology

## 2020-11-07 ENCOUNTER — Other Ambulatory Visit: Payer: Self-pay | Admitting: Nurse Practitioner

## 2020-11-07 ENCOUNTER — Telehealth: Payer: Self-pay

## 2020-11-07 DIAGNOSIS — I10 Essential (primary) hypertension: Secondary | ICD-10-CM

## 2020-11-07 DIAGNOSIS — E782 Mixed hyperlipidemia: Secondary | ICD-10-CM

## 2020-11-07 DIAGNOSIS — E1165 Type 2 diabetes mellitus with hyperglycemia: Secondary | ICD-10-CM

## 2020-11-07 NOTE — Telephone Encounter (Signed)
Patient requesting appt, last OV was October, can you update labs and see if he needs any others? He will see Nida when he returns

## 2020-11-07 NOTE — Telephone Encounter (Signed)
done

## 2020-11-08 DIAGNOSIS — E1165 Type 2 diabetes mellitus with hyperglycemia: Secondary | ICD-10-CM | POA: Diagnosis not present

## 2020-11-09 LAB — COMPREHENSIVE METABOLIC PANEL
ALT: 21 IU/L (ref 0–44)
AST: 15 IU/L (ref 0–40)
Albumin/Globulin Ratio: 2 (ref 1.2–2.2)
Albumin: 4.7 g/dL (ref 3.8–4.8)
Alkaline Phosphatase: 95 IU/L (ref 44–121)
BUN/Creatinine Ratio: 20 (ref 10–24)
BUN: 19 mg/dL (ref 8–27)
Bilirubin Total: 0.5 mg/dL (ref 0.0–1.2)
CO2: 22 mmol/L (ref 20–29)
Calcium: 9.4 mg/dL (ref 8.6–10.2)
Chloride: 100 mmol/L (ref 96–106)
Creatinine, Ser: 0.93 mg/dL (ref 0.76–1.27)
Globulin, Total: 2.3 g/dL (ref 1.5–4.5)
Glucose: 243 mg/dL — ABNORMAL HIGH (ref 65–99)
Potassium: 4.2 mmol/L (ref 3.5–5.2)
Sodium: 136 mmol/L (ref 134–144)
Total Protein: 7 g/dL (ref 6.0–8.5)
eGFR: 92 mL/min/{1.73_m2} (ref >59)

## 2020-11-09 LAB — LIPID PANEL
Chol/HDL Ratio: 2.9 ratio (ref 0.0–5.0)
Cholesterol, Total: 132 mg/dL (ref 100–199)
HDL: 46 mg/dL (ref >39)
LDL Chol Calc (NIH): 73 mg/dL (ref 0–99)
Triglycerides: 60 mg/dL (ref 0–149)
VLDL Cholesterol Cal: 13 mg/dL (ref 5–40)

## 2020-11-09 LAB — TSH: TSH: 1.51 u[IU]/mL (ref 0.450–4.500)

## 2020-11-09 LAB — VITAMIN D 25 HYDROXY (VIT D DEFICIENCY, FRACTURES): Vit D, 25-Hydroxy: 21.5 ng/mL — ABNORMAL LOW (ref 30.0–100.0)

## 2020-11-09 LAB — T4, FREE: Free T4: 1.13 ng/dL (ref 0.82–1.77)

## 2020-11-17 ENCOUNTER — Encounter: Payer: Self-pay | Admitting: "Endocrinology

## 2020-11-17 ENCOUNTER — Ambulatory Visit (INDEPENDENT_AMBULATORY_CARE_PROVIDER_SITE_OTHER): Payer: BC Managed Care – PPO | Admitting: "Endocrinology

## 2020-11-17 ENCOUNTER — Other Ambulatory Visit: Payer: Self-pay

## 2020-11-17 VITALS — BP 138/96 | HR 88 | Ht 70.0 in | Wt 182.0 lb

## 2020-11-17 DIAGNOSIS — Z9119 Patient's noncompliance with other medical treatment and regimen: Secondary | ICD-10-CM

## 2020-11-17 DIAGNOSIS — E559 Vitamin D deficiency, unspecified: Secondary | ICD-10-CM | POA: Diagnosis not present

## 2020-11-17 DIAGNOSIS — E782 Mixed hyperlipidemia: Secondary | ICD-10-CM

## 2020-11-17 DIAGNOSIS — E1165 Type 2 diabetes mellitus with hyperglycemia: Secondary | ICD-10-CM | POA: Diagnosis not present

## 2020-11-17 DIAGNOSIS — I10 Essential (primary) hypertension: Secondary | ICD-10-CM

## 2020-11-17 DIAGNOSIS — Z91199 Patient's noncompliance with other medical treatment and regimen due to unspecified reason: Secondary | ICD-10-CM

## 2020-11-17 LAB — HEMOGLOBIN A1C: Hemoglobin A1C: 11.6

## 2020-11-17 MED ORDER — INSULIN ASPART 100 UNIT/ML FLEXPEN: 1.0000 [IU] | PEN_INJECTOR | Freq: Three times a day (TID) | SUBCUTANEOUS | 1 refills | Status: DC

## 2020-11-17 MED ORDER — VITAMIN D3 125 MCG (5000 UT) PO CAPS: 5000.0000 [IU] | ORAL_CAPSULE | Freq: Every day | ORAL | 0 refills | Status: DC

## 2020-11-17 NOTE — Progress Notes (Signed)
11/17/2020, 5:27 PM   Endocrinology follow-up note  Subjective:    Patient ID: Travis Sharp, male    DOB: 09-25-1955.  Travis Sharp is being seen in follow-up after he was seen in consultation for management of currently uncontrolled symptomatic diabetes requested by  Cory Munch, PA-C.   Past Medical History:  Diagnosis Date   Colitis 03/23/2015   Diabetes mellitus    Gastritis    Hypertension     Past Surgical History:  Procedure Laterality Date   BIOPSY  05/10/2015   Procedure: BIOPSY;  Surgeon: Danie Binder, MD;  Location: [facility];  Service: Endoscopy;;  gastric bx's   COLONOSCOPY WITH PROPOFOL N/A 05/10/2015   dr. Oneida Alar: Mild diverticulosis, hemorrhoids.  Next colonoscopy 10 years   ESOPHAGOGASTRODUODENOSCOPY (EGD) WITH PROPOFOL N/A 05/10/2015   Dr. Oneida Alar: dysphagai due to stricture at [facility] s/p dilation, mild nonerosive gastritis.    SAVORY DILATION N/A 05/10/2015   Procedure: SAVORY DILATION;  Surgeon: Danie Binder, MD;  Location: [facility];  Service: Endoscopy;  Laterality: N/A;   TONSILLECTOMY      Social History   Socioeconomic History   Marital status: Married    Spouse name: Olin Hauser   Number of children: 3   Years of education: Not on file   Highest education level: Not on file  Occupational History   Occupation: [employer]  Tobacco Use   Smoking status: Former    Packs/day: 1.00    Years: 28.00    Pack years: 28.00    Types: Cigarettes    Quit date: 05/04/2005    Years since quitting: 15.5   Smokeless tobacco: Never  Substance and Sexual Activity   Alcohol use: Not Currently    Alcohol/week: 0.0 standard drinks    Comment: very seldom   Drug use: Not Currently    Types: Marijuana    Comment: denied 10/28/19   Sexual activity: Not on file  Other Topics Concern   Not on file  Social History Narrative   Not on file   Social Determinants of Health   Financial Resource Strain: Not on file  Food  Insecurity: Not on file  Transportation Needs: Not on file  Physical Activity: Not on file  Stress: Not on file  Social Connections: Not on file    Family History  Problem Relation Age of Onset   Colon cancer Other        possibly 2 uncles   Stroke Mother    Aneurysm Mother    Hypertension Mother    Heart attack Father    Hypertension Sister    Hypertension Brother    Hypertension Daughter     Outpatient Encounter Medications as of 11/17/2020  Medication Sig   Cholecalciferol (VITAMIN D3) 125 MCG (5000 UT) CAPS Take 1 capsule (5,000 Units total) by mouth daily.   aspirin 81 MG tablet Take 81 mg by mouth daily.   glucose blood (ACCU-CHEK GUIDE) test strip Use as instructed   insulin aspart (NOVOLOG) 100 UNIT/ML FlexPen Inject 1 Units into the skin 3 (three) times daily with meals.   lubiprostone (AMITIZA) 24 MCG capsule Take one to two times daily with food for constipation (Patient not taking: Reported on 11/17/2020)   olmesartan-hydrochlorothiazide (BENICAR HCT) 40-25 MG tablet Take 1 tablet by mouth daily.   TRESIBA FLEXTOUCH 200 UNIT/ML SOPN Inject 40 Units into the skin at bedtime.   [DISCONTINUED] Continuous Blood Gluc Receiver [facility]  G6 RECEIVER) DEVI 1 Piece by Does not apply route as needed.   [DISCONTINUED] Continuous Blood Gluc Sensor (DEXCOM G6 SENSOR) MISC 4 Pieces by Does not apply route once a week.   [DISCONTINUED] Continuous Blood Gluc Transmit (DEXCOM G6 TRANSMITTER) MISC 1 Piece by Does not apply route as directed.   [DISCONTINUED] insulin aspart (NOVOLOG) 100 UNIT/ML FlexPen Inject 6-12 Units into the skin 3 (three) times daily with meals.   No facility-administered encounter medications on file as of 11/17/2020.    ALLERGIES: No Known Allergies  VACCINATION STATUS: Immunization History  Administered Date(s) Administered   Influenza,inj,Quad PF,6+ Mos 03/24/2015   Moderna Sars-Covid-2 Vaccination 06/16/2019, 07/14/2019   Pneumococcal Polysaccharide-23  03/24/2015    Diabetes He presents for his follow-up diabetic visit. He has type 2 diabetes mellitus. Onset time: He was diagnosed at approximate age of 65 years. His disease course has been worsening (This patient was seen prior to 2017 at which time his A1c was 6.1%.  He disappeared from care, returns with loss of control with A1c of>14.4%.). There are no hypoglycemic associated symptoms. Pertinent negatives for hypoglycemia include no confusion, headaches, pallor or seizures. Pertinent negatives for diabetes include no blurred vision, no chest pain, no fatigue, no polydipsia, no polyphagia, no polyuria and no weakness. There are no hypoglycemic complications. Symptoms are worsening. There are no diabetic complications. Risk factors for coronary artery disease include diabetes mellitus, hypertension, family history, male sex and tobacco exposure. Current diabetic treatment includes insulin injections. His weight is increasing steadily. He is following a generally unhealthy diet. When asked about meal planning, he reported none. He has not had a previous visit with a dietitian. He rarely participates in exercise. (He presents with no meter nor logs.  His point-of-care A1c is 11.6%, increasing from 7.2%.  He did have A1c as high as greater than 14% last year.  He did not document any hypoglycemia.) An ACE inhibitor/angiotensin II receptor blocker is being taken. Eye exam is not current.  Hypertension This is a chronic problem. The current episode started more than 1 year ago. Pertinent negatives include no blurred vision, chest pain, headaches, neck pain, palpitations or shortness of breath. Risk factors for coronary artery disease include diabetes mellitus, male gender, smoking/tobacco exposure and family history. Past treatments include angiotensin blockers.    Review of Systems  Constitutional:  Negative for chills, fatigue, fever and unexpected weight change.  HENT:  Negative for dental problem,  mouth sores and trouble swallowing.   Eyes:  Negative for blurred vision and visual disturbance.  Respiratory:  Negative for cough, choking, chest tightness, shortness of breath and wheezing.   Cardiovascular:  Negative for chest pain, palpitations and leg swelling.  Gastrointestinal:  Negative for abdominal distention, abdominal pain, constipation, diarrhea, nausea and vomiting.  Endocrine: Negative for polydipsia, polyphagia and polyuria.  Genitourinary:  Negative for dysuria, flank pain, hematuria and urgency.  Musculoskeletal:  Negative for back pain, gait problem, myalgias and neck pain.  Skin:  Negative for pallor, rash and wound.  Neurological:  Negative for seizures, syncope, weakness, numbness and headaches.  Psychiatric/Behavioral:  Negative for confusion and dysphoric mood.    Objective:    Vitals with BMI 11/17/2020 02/01/2020 12/25/2019  Height '5\' 10"'$  '5\' 10"'$  '5\' 10"'$   Weight 182 lbs 188 lbs 10 oz 183 lbs 10 oz  BMI 26.11 [id number]  Systolic 0000000 [id number]  Diastolic 96 92 97  Pulse 88 80 67    BP (!) 138/96   Pulse 88  Ht '5\' 10"'$  (1.778 m)   Wt 182 lb (82.6 kg)   BMI 26.11 kg/m   Wt Readings from Last 3 Encounters:  11/17/20 182 lb (82.6 kg)  02/01/20 188 lb 9.6 oz (85.5 kg)  12/25/19 183 lb 9.6 oz (83.3 kg)    Hesitant affect.     CMP ( most recent) CMP     Component Value Date/Time   NA 136 11/08/2020 0916   K 4.2 11/08/2020 0916   CL 100 11/08/2020 0916   CO2 22 11/08/2020 0916   GLUCOSE 243 (H) 11/08/2020 0916   GLUCOSE 225 (H) 10/12/2019 1322   BUN 19 11/08/2020 0916   CREATININE 0.93 11/08/2020 0916   CREATININE 0.89 10/26/2015 0758   CALCIUM 9.4 11/08/2020 0916   PROT 7.0 11/08/2020 0916   ALBUMIN 4.7 11/08/2020 0916   AST 15 11/08/2020 0916   ALT 21 11/08/2020 0916   ALKPHOS 95 11/08/2020 0916   BILITOT 0.5 11/08/2020 0916   GFRNONAA 61 10/21/2019 0000   GFRAA 70 10/21/2019 0000     Diabetic Labs (most recent): Lab Results  Component  Value Date   HGBA1C 7.2 (A) 02/01/2020   HGBA1C >14 10/21/2019   HGBA1C 6.1 (H) 10/26/2015     Lipid Panel ( most recent) Lipid Panel     Component Value Date/Time   CHOL 132 11/08/2020 0916   TRIG 60 11/08/2020 0916   HDL 46 11/08/2020 0916   CHOLHDL 2.9 11/08/2020 0916   CHOLHDL 3.0 10/26/2015 0758   VLDL 16 10/26/2015 0758   LDLCALC 73 11/08/2020 0916   LABVLDL 13 11/08/2020 0916      Lab Results  Component Value Date   TSH 1.510 11/08/2020   TSH 1.62 10/21/2019   TSH 1.23 10/26/2015   FREET4 1.13 11/08/2020   FREET4 1.1 10/26/2015     Assessment & Plan:   1. Uncontrolled type 2 diabetes mellitus with hyperglycemia ([place])  - [place] has currently uncontrolled symptomatic type 2 DM since  65 years of age.  He presents with no meter nor logs.  His point-of-care A1c is 11.6%, increasing from 7.2%.  He did have A1c as high as greater than 14% last year.  He did not document any hypoglycemia.  Recent labs reviewed. -The patient was seen in the clinic prior to 2017 at which time his A1c was 6.1%, however, unfortunately he disappeared from care. - I had a long discussion with him about the progressive nature of diabetes and the pathology behind its complications. -his diabetes is complicated by nonfollow-up and he remains at a high risk for more acute and chronic complications which include CAD, CVA, CKD, retinopathy, and neuropathy. These are all discussed in detail with him.  - I have counseled him on diet  and weight management  by adopting a carbohydrate restricted/protein rich diet. Patient is encouraged to switch to  unprocessed or minimally processed     complex starch and increased protein intake (animal or plant source), fruits, and vegetables. -  he is advised to stick to a routine mealtimes to eat 3 meals  a day and avoid unnecessary snacks ( to snack only to correct hypoglycemia).  - he acknowledges that there is a room for improvement in his food and drink  choices. - Suggestion is made for him to avoid simple carbohydrates  from his diet including Cakes, Sweet Desserts, Ice Cream, Soda (diet and regular), Sweet Tea, Candies, Chips, Cookies, Store Bought Juices, Alcohol in Excess of  1-2  drinks a day, Artificial Sweeteners,  Coffee Creamer, and "Sugar-free" Products, Lemonade. This will help patient to have more stable blood glucose profile and potentially avoid unintended weight gain.  - he will be scheduled with Jearld Fenton, RDN, CDE for diabetes education.  - I have approached him with the following individualized plan to manage  his diabetes and patient agrees:  -In light of his presentation with significant worsening of his glycemic profile, he will continue to need intensive treatment with basal/bolus insulin.   -However, regrettably, he did not commit for proper monitoring of blood glucose for safe use of insulin.  He is advised to increase his [place] to 40 units nightly, hold NovoLog until his next visit in 10 days. -His approach to initiate monitoring of blood glucose 4 times a day-before meals and at bedtime and return with his meter and logs. - he is warned not to take insulin without proper monitoring per orders. - Adjustment parameters are given to him for hypo and hyperglycemia in writing. - he is encouraged to call clinic for blood glucose levels less than 70 or above 300 mg /dl.  - he is not a candidate for incretin therapy due to body habitus.  -He will need more work-up to classify his diabetes properly, and the possibility of pancreatic diabetes or LAD in this patient.  Anti-GAD and antiislet antibodies are ordered for him to do before his next visit.  - Specific targets for  A1c;  LDL, HDL,  and Triglycerides were discussed with the patient.  2) Blood Pressure /Hypertension: His blood pressure is not controlled to target.  He is coming off of a night shift. he is advised to continue his current medications including Benicar  40/25 mg p.o. daily with breakfast . 3) Lipids/Hyperlipidemia:   Review of his recent lipid panel showed uncontrolled  LDL at 107 .  he  Is not on statins.  Will be considered on next visit.    4)  Weight/Diet:  Body mass index is 26.11 kg/m.  -      he is not a candidate for major weight loss.    Exercise, and detailed carbohydrates information provided  -  detailed on discharge instructions.  5) Chronic Care/Health Maintenance:  -he  is on ARB medications and  is encouraged to initiate and continue to follow up with Ophthalmology, Dentist, referred to ophthalmology, podiatrist at least yearly or according to recommendations, and advised to   stay away from smoking. I have recommended yearly flu vaccine and pneumonia vaccine at least every 5 years; moderate intensity exercise for up to 150 minutes weekly; and  sleep for at least 7 hours a day.  - he is  advised to maintain close follow up with Cory Munch, PA-C for primary care needs, as well as his other providers for optimal and coordinated care.    I spent 35 minutes in the care of the patient today including review of labs from [facility], Lipids, Thyroid Function, Hematology (current and previous including abstractions from other facilities); face-to-face time discussing  his blood glucose readings/logs, discussing hypoglycemia and hyperglycemia episodes and symptoms, medications doses, his options of short and long term treatment based on the latest standards of care / guidelines;  discussion about incorporating lifestyle medicine;  and documenting the encounter.    Please refer to Patient Instructions for Blood Glucose Monitoring and Insulin/Medications Dosing Guide"  in media tab for additional information. Please  also refer to " Patient Self Inventory" in the Media  tab for reviewed elements of pertinent patient history.  Travis Junes Rodda participated in the discussions, expressed understanding, and voiced agreement with the above plans.   All questions were answered to his satisfaction. he is encouraged to contact clinic should he have any questions or concerns prior to his return visit.    Follow up plan: - Return in about 10 days (around 11/27/2020) for F/U with Meter and Logs Only - no Labs.  Glade Lloyd, MD [facility] [address] [place], [place] [address] Phone: [phone]  Fax: [phone]    11/17/2020, 5:27 PM  This note was partially dictated with voice recognition software. Similar sounding words can be transcribed inadequately or may not  be corrected upon review.

## 2020-11-17 NOTE — Patient Instructions (Signed)

## 2020-11-22 ENCOUNTER — Telehealth: Payer: Self-pay | Admitting: "Endocrinology

## 2020-11-22 DIAGNOSIS — E1165 Type 2 diabetes mellitus with hyperglycemia: Secondary | ICD-10-CM

## 2020-11-22 MED ORDER — TRESIBA FLEXTOUCH 200 UNIT/ML ~~LOC~~ SOPN: 40.0000 [IU] | PEN_INJECTOR | Freq: Every day | SUBCUTANEOUS | 0 refills | Status: DC

## 2020-11-22 NOTE — Telephone Encounter (Signed)
Sent Rx for tresiba to [facility]. According to pt's last office visit note pt is to hold novolog until pt's next visit, did not send Rx refill in for novolog. Tried to call pt, did not receive an answer.

## 2020-11-22 NOTE — Telephone Encounter (Signed)
Pt is calling and states that he is needing his [place] and Novolog.  [id number] [place]  [phone] Fax:  [phone]

## 2020-11-29 ENCOUNTER — Ambulatory Visit: Payer: BC Managed Care – PPO | Admitting: "Endocrinology

## 2021-02-16 DIAGNOSIS — Z91199 Patient's noncompliance with other medical treatment and regimen due to unspecified reason: Secondary | ICD-10-CM | POA: Diagnosis not present

## 2021-02-16 DIAGNOSIS — E1159 Type 2 diabetes mellitus with other circulatory complications: Secondary | ICD-10-CM | POA: Diagnosis not present

## 2021-02-16 DIAGNOSIS — E1165 Type 2 diabetes mellitus with hyperglycemia: Secondary | ICD-10-CM | POA: Diagnosis not present

## 2021-02-16 DIAGNOSIS — E782 Mixed hyperlipidemia: Secondary | ICD-10-CM | POA: Diagnosis not present

## 2021-02-16 DIAGNOSIS — Z1331 Encounter for screening for depression: Secondary | ICD-10-CM | POA: Diagnosis not present

## 2021-02-16 DIAGNOSIS — I152 Hypertension secondary to endocrine disorders: Secondary | ICD-10-CM | POA: Diagnosis not present

## 2021-02-16 DIAGNOSIS — K219 Gastro-esophageal reflux disease without esophagitis: Secondary | ICD-10-CM | POA: Diagnosis not present

## 2021-02-16 DIAGNOSIS — E663 Overweight: Secondary | ICD-10-CM | POA: Diagnosis not present

## 2021-02-16 DIAGNOSIS — Z0000 Encounter for general adult medical examination without abnormal findings: Secondary | ICD-10-CM | POA: Diagnosis not present

## 2021-02-16 DIAGNOSIS — Z6827 Body mass index (BMI) 27.0-27.9, adult: Secondary | ICD-10-CM | POA: Diagnosis not present

## 2021-02-17 ENCOUNTER — Other Ambulatory Visit: Payer: Self-pay | Admitting: "Endocrinology

## 2021-02-28 ENCOUNTER — Other Ambulatory Visit: Payer: Self-pay | Admitting: "Endocrinology

## 2021-02-28 DIAGNOSIS — E1165 Type 2 diabetes mellitus with hyperglycemia: Secondary | ICD-10-CM

## 2021-04-21 ENCOUNTER — Other Ambulatory Visit: Payer: Self-pay | Admitting: "Endocrinology

## 2021-04-21 DIAGNOSIS — E1165 Type 2 diabetes mellitus with hyperglycemia: Secondary | ICD-10-CM

## 2021-05-04 ENCOUNTER — Telehealth: Payer: Self-pay | Admitting: "Endocrinology

## 2021-05-04 NOTE — Telephone Encounter (Signed)
Called pt to let him know that he could pick up 1 sample of Tresbia. Left pt a VM - I signed it out and its in fridge

## 2021-05-04 NOTE — Telephone Encounter (Signed)
Patient states he is out of insulin. Patient has not been seen since 11/17/2021. I made pt aware he needs an appt with meter and logs before a refill can be sent in. Do you want me to give this pt a sample of Tresbia if we have it until he can be here Monday?

## 2021-05-08 ENCOUNTER — Ambulatory Visit (INDEPENDENT_AMBULATORY_CARE_PROVIDER_SITE_OTHER): Payer: BC Managed Care – PPO | Admitting: "Endocrinology

## 2021-05-08 ENCOUNTER — Encounter: Payer: Self-pay | Admitting: "Endocrinology

## 2021-05-08 ENCOUNTER — Other Ambulatory Visit: Payer: Self-pay

## 2021-05-08 VITALS — BP 152/96 | HR 88 | Ht 70.0 in | Wt 184.6 lb

## 2021-05-08 DIAGNOSIS — Z91199 Patient's noncompliance with other medical treatment and regimen due to unspecified reason: Secondary | ICD-10-CM | POA: Insufficient documentation

## 2021-05-08 DIAGNOSIS — I10 Essential (primary) hypertension: Secondary | ICD-10-CM | POA: Diagnosis not present

## 2021-05-08 DIAGNOSIS — E782 Mixed hyperlipidemia: Secondary | ICD-10-CM

## 2021-05-08 DIAGNOSIS — E1165 Type 2 diabetes mellitus with hyperglycemia: Secondary | ICD-10-CM

## 2021-05-08 DIAGNOSIS — E559 Vitamin D deficiency, unspecified: Secondary | ICD-10-CM

## 2021-05-08 LAB — POCT GLYCOSYLATED HEMOGLOBIN (HGB A1C): HbA1c, POC (controlled diabetic range): 8.4 % — AB (ref 0.0–7.0)

## 2021-05-08 MED ORDER — TRESIBA FLEXTOUCH 200 UNIT/ML ~~LOC~~ SOPN: 40.0000 [IU] | PEN_INJECTOR | Freq: Every day | SUBCUTANEOUS | 1 refills | Status: DC

## 2021-05-08 NOTE — Patient Instructions (Signed)

## 2021-05-08 NOTE — Progress Notes (Signed)
05/08/2021, 5:02 PM   Endocrinology follow-up note  Subjective:    Patient ID: Travis Sharp, male    DOB: August 03, 1955.  Travis Sharp is being seen in follow-up after he was seen in consultation for management of currently uncontrolled symptomatic diabetes requested by  Cory Munch, PA-C.   Past Medical History:  Diagnosis Date   Colitis 03/23/2015   Diabetes mellitus    Gastritis    Hypertension     Past Surgical History:  Procedure Laterality Date   BIOPSY  05/10/2015   Procedure: BIOPSY;  Surgeon: Danie Binder, MD;  Location: [facility];  Service: Endoscopy;;  gastric bx's   COLONOSCOPY WITH PROPOFOL N/A 05/10/2015   dr. Oneida Alar: Mild diverticulosis, hemorrhoids.  Next colonoscopy 10 years   ESOPHAGOGASTRODUODENOSCOPY (EGD) WITH PROPOFOL N/A 05/10/2015   Dr. Oneida Alar: dysphagai due to stricture at [facility] s/p dilation, mild nonerosive gastritis.    SAVORY DILATION N/A 05/10/2015   Procedure: SAVORY DILATION;  Surgeon: Danie Binder, MD;  Location: [facility];  Service: Endoscopy;  Laterality: N/A;   TONSILLECTOMY      Social History   Socioeconomic History   Marital status: Married    Spouse name: Travis Sharp   Number of children: 3   Years of education: Not on file   Highest education level: Not on file  Occupational History   Occupation: [employer]  Tobacco Use   Smoking status: Former    Packs/day: 1.00    Years: 28.00    Pack years: 28.00    Types: Cigarettes    Quit date: 05/04/2005    Years since quitting: 16.0   Smokeless tobacco: Never  Substance and Sexual Activity   Alcohol use: Not Currently    Alcohol/week: 0.0 standard drinks    Comment: very seldom   Drug use: Not Currently    Types: Marijuana    Comment: denied 10/28/19   Sexual activity: Not on file  Other Topics Concern   Not on file  Social History Narrative   Not on file   Social Determinants of Health   Financial Resource Strain: Not on file  Food  Insecurity: Not on file  Transportation Needs: Not on file  Physical Activity: Not on file  Stress: Not on file  Social Connections: Not on file    Family History  Problem Relation Age of Onset   Colon cancer Other        possibly 2 uncles   Stroke Mother    Aneurysm Mother    Hypertension Mother    Heart attack Father    Hypertension Sister    Hypertension Brother    Hypertension Daughter     Outpatient Encounter Medications as of 05/08/2021  Medication Sig   aspirin 81 MG tablet Take 81 mg by mouth daily.   [employer] D3 125 MCG (5000 UT) capsule TAKE 1 CAPSULE BY MOUTH EVERY DAY   glucose blood (ACCU-CHEK GUIDE) test strip Use as instructed   insulin aspart (NOVOLOG) 100 UNIT/ML FlexPen Inject 1 Units into the skin 3 (three) times daily with meals. (Patient not taking: Reported on 05/08/2021)   olmesartan-hydrochlorothiazide (BENICAR HCT) 40-25 MG tablet Take 1 tablet by mouth daily.   TRESIBA FLEXTOUCH 200 UNIT/ML FlexTouch Pen Inject 40 Units into the skin at bedtime.   [DISCONTINUED] lubiprostone (AMITIZA) 24 MCG capsule Take one to two times daily with food for constipation (Patient not taking: Reported on 11/17/2020)   [DISCONTINUED]  TRESIBA FLEXTOUCH 200 UNIT/ML FlexTouch Pen INJECT 40 UNITS INTO THE SKIN AT BEDTIME. (Patient taking differently: Inject 30-40 Units into the skin at bedtime.)   No facility-administered encounter medications on file as of 05/08/2021.    ALLERGIES: No Known Allergies  VACCINATION STATUS: Immunization History  Administered Date(s) Administered   Influenza,inj,Quad PF,6+ Mos 03/24/2015   Moderna Sars-Covid-2 Vaccination 06/16/2019, 07/14/2019   Pneumococcal Polysaccharide-23 03/24/2015    Diabetes He presents for his follow-up diabetic visit. He has type 2 diabetes mellitus. Onset time: He was diagnosed at approximate age of 66 years. His disease course has been improving (This patient was seen prior to 2017 at which time his A1c was 6.1%.  He  disappeared from care, returns with loss of control with A1c of>14.4%.). There are no hypoglycemic associated symptoms. Pertinent negatives for hypoglycemia include no confusion, headaches, pallor or seizures. Pertinent negatives for diabetes include no blurred vision, no chest pain, no fatigue, no polydipsia, no polyphagia, no polyuria and no weakness. There are no hypoglycemic complications. Symptoms are improving. There are no diabetic complications. Risk factors for coronary artery disease include diabetes mellitus, hypertension, family history, male sex and tobacco exposure. Current diabetic treatment includes insulin injections. His weight is fluctuating minimally. He is following a generally unhealthy diet. When asked about meal planning, he reported none. He has not had a previous visit with a dietitian. He rarely participates in exercise. His home blood glucose trend is decreasing steadily. His breakfast blood glucose range is generally >200 mg/dl. His overall blood glucose range is >200 mg/dl. (He presents with incomplete data his meter showing 5 readings the last 7 days, 13 weeks the last 14 days averaging 214 mg per DL.  No hypoglycemia reported or documented.   ) An ACE inhibitor/angiotensin II receptor blocker is being taken. Eye exam is not current.  Hypertension This is a chronic problem. The current episode started more than 1 year ago. Pertinent negatives include no blurred vision, chest pain, headaches, neck pain, palpitations or shortness of breath. Risk factors for coronary artery disease include diabetes mellitus, male gender, smoking/tobacco exposure and family history. Past treatments include angiotensin blockers.    Review of Systems  Constitutional:  Negative for chills, fatigue, fever and unexpected weight change.  HENT:  Negative for dental problem, mouth sores and trouble swallowing.   Eyes:  Negative for blurred vision and visual disturbance.  Respiratory:  Negative for cough,  choking, chest tightness, shortness of breath and wheezing.   Cardiovascular:  Negative for chest pain, palpitations and leg swelling.  Gastrointestinal:  Negative for abdominal distention, abdominal pain, constipation, diarrhea, nausea and vomiting.  Endocrine: Negative for polydipsia, polyphagia and polyuria.  Genitourinary:  Negative for dysuria, flank pain, hematuria and urgency.  Musculoskeletal:  Negative for back pain, gait problem, myalgias and neck pain.  Skin:  Negative for pallor, rash and wound.  Neurological:  Negative for seizures, syncope, weakness, numbness and headaches.  Psychiatric/Behavioral:  Negative for confusion and dysphoric mood.    Objective:    Vitals with BMI 05/08/2021 11/17/2020 02/01/2020  Height 5\' 10"  5\' 10"  5\' 10"   Weight 184 lbs 10 oz 182 lbs 188 lbs 10 oz  BMI 26.49 63.01 60.10  Systolic 932 355 732  Diastolic 96 96 92  Pulse 88 88 80    BP (!) 152/96    Pulse 88    Ht 5\' 10"  (1.778 m)    Wt 184 lb 9.6 oz (83.7 kg)    BMI 26.49 kg/m  Wt Readings from Last 3 Encounters:  05/08/21 184 lb 9.6 oz (83.7 kg)  11/17/20 182 lb (82.6 kg)  02/01/20 188 lb 9.6 oz (85.5 kg)    Hesitant affect.     CMP ( most recent) CMP     Component Value Date/Time   NA 136 11/08/2020 0916   K 4.2 11/08/2020 0916   CL 100 11/08/2020 0916   CO2 22 11/08/2020 0916   GLUCOSE 243 (H) 11/08/2020 0916   GLUCOSE 225 (H) 10/12/2019 1322   BUN 19 11/08/2020 0916   CREATININE 0.93 11/08/2020 0916   CREATININE 0.89 10/26/2015 0758   CALCIUM 9.4 11/08/2020 0916   PROT 7.0 11/08/2020 0916   ALBUMIN 4.7 11/08/2020 0916   AST 15 11/08/2020 0916   ALT 21 11/08/2020 0916   ALKPHOS 95 11/08/2020 0916   BILITOT 0.5 11/08/2020 0916   GFRNONAA 61 10/21/2019 0000   GFRAA 70 10/21/2019 0000     Diabetic Labs (most recent): Lab Results  Component Value Date   HGBA1C 8.4 (A) 05/08/2021   HGBA1C 11.6 11/17/2020   HGBA1C 7.2 (A) 02/01/2020     Lipid Panel ( most  recent) Lipid Panel     Component Value Date/Time   CHOL 132 11/08/2020 0916   TRIG 60 11/08/2020 0916   HDL 46 11/08/2020 0916   CHOLHDL 2.9 11/08/2020 0916   CHOLHDL 3.0 10/26/2015 0758   VLDL 16 10/26/2015 0758   LDLCALC 73 11/08/2020 0916   LABVLDL 13 11/08/2020 0916      Lab Results  Component Value Date   TSH 1.510 11/08/2020   TSH 1.62 10/21/2019   TSH 1.23 10/26/2015   FREET4 1.13 11/08/2020   FREET4 1.1 10/26/2015     Assessment & Plan:   1. Uncontrolled type 2 diabetes mellitus with hyperglycemia ([place])  - Mar[place] has currently uncontrolled symptomatic type 2 DM since  66 years of age.  He presents with incomplete data his meter showing 5 readings the last 7 days, 13 weeks the last 14 days averaging 214 mg per DL.  No hypoglycemia reported or documented.  His point-of-care A1c is 8.4% today improving from 11.6% during his last visit.      Recent labs reviewed. -The patient was seen in the clinic prior to 2017 at which time his A1c was 6.1%, however, unfortunately he disappeared from care. - I had a long discussion with him about the progressive nature of diabetes and the pathology behind its complications. -his diabetes is complicated by nonfollow-up and he remains at a high risk for more acute and chronic complications which include CAD, CVA, CKD, retinopathy, and neuropathy. These are all discussed in detail with him.  - I have counseled him on diet  and weight management  by adopting a carbohydrate restricted/protein rich diet. Patient is encouraged to switch to  unprocessed or minimally processed     complex starch and increased protein intake (animal or plant source), fruits, and vegetables. -  he is advised to stick to a routine mealtimes to eat 3 meals  a day and avoid unnecessary snacks ( to snack only to correct hypoglycemia).    - he acknowledges that there is a room for improvement in his food and drink choices. - Suggestion is made for him to  avoid simple carbohydrates  from his diet including Cakes, Sweet Desserts, Ice Cream, Soda (diet and regular), Sweet Tea, Candies, Chips, Cookies, Store Bought Juices, Alcohol , Artificial Sweeteners,  Coffee Creamer, and "Sugar-free" Products,  Lemonade. This will help patient to have more stable blood glucose profile and potentially avoid unintended weight gain.  The following Lifestyle Medicine recommendations according to [place]  [facility]) were discussed and and offered to patient and he  agrees to start the journey:  A. Whole Foods, Plant-Based Nutrition comprising of fruits and vegetables, plant-based proteins, whole-grain carbohydrates was discussed in detail with the patient.   A list for source of those nutrients were also provided to the patient.  Patient will use only water or unsweetened tea for hydration. B.  The need to stay away from risky substances including alcohol, smoking; obtaining 7 to 9 hours of restorative sleep, at least 150 minutes of moderate intensity exercise weekly, the importance of healthy social connections,  and stress management techniques were discussed.   - he will be scheduled with Jearld Fenton, RDN, CDE for diabetes education.  - I have approached him with the following individualized plan to manage  his diabetes and patient agrees:  -In light of his presentation with near target glycemic profile, he will not need prandial insulin for now.    -He has difficulty adhering to follow-ups and treatment recommendations.  He promises to do better.  He is advised to increase his [place] to 40 units nightly, associated with monitoring of blood glucose at least twice a day-daily before breakfast and at bedtime.   - he is encouraged to call clinic for blood glucose levels less than 70 or above 200 mg /dl.  - he is not a candidate for incretin therapy due to body habitus.  -He will need more work-up to classify his diabetes properly, and the  possibility of pancreatic diabetes or LAD in this patient.  Anti-GAD and antiislet antibodies are ordered for him to do before his next visit.  - Specific targets for  A1c;  LDL, HDL,  and Triglycerides were discussed with the patient.  2) Blood Pressure /Hypertension: His blood pressure is not controlled to target.  He is coming off of a night shift. he is advised to continue his current medications including Benicar 40/25 mg p.o. daily with breakfast .  3) Lipids/Hyperlipidemia:   Review of his recent lipid panel showed uncontrolled  LDL at 107 .  he  is not on statins.  Will be considered on next visit.  The above diet will help manage his dyslipidemia as well.   4)  Weight/Diet:  Body mass index is 26.49 kg/m.  -      he is not a candidate for major weight loss.    Exercise, and detailed carbohydrates information provided  -  detailed on discharge instructions.  5) Chronic Care/Health Maintenance:  -he  is on ARB medications and  is encouraged to initiate and continue to follow up with Ophthalmology, Dentist, referred to ophthalmology, podiatrist at least yearly or according to recommendations, and advised to   stay away from smoking. I have recommended yearly flu vaccine and pneumonia vaccine at least every 5 years; moderate intensity exercise for up to 150 minutes weekly; and  sleep for at least 7 hours a day.  - he is  advised to maintain close follow up with Cory Munch, PA-C for primary care needs, as well as his other providers for optimal and coordinated care.    I spent 44 minutes in the care of the patient today including review of labs from CMP, Lipids, Thyroid Function, Hematology (current and previous including abstractions from other facilities); face-to-face time  discussing  his blood glucose readings/logs, discussing hypoglycemia and hyperglycemia episodes and symptoms, medications doses, his options of short and long term treatment based on the latest standards of care /  guidelines;  discussion about incorporating lifestyle medicine;  and documenting the encounter.    Please refer to Patient Instructions for Blood Glucose Monitoring and Insulin/Medications Dosing Guide"  in media tab for additional information. Please  also refer to " Patient Self Inventory" in the Media  tab for reviewed elements of pertinent patient history.  Travis Junes Gobert participated in the discussions, expressed understanding, and voiced agreement with the above plans.  All questions were answered to his satisfaction. he is encouraged to contact clinic should he have any questions or concerns prior to his return visit.   Follow up plan: - Return in about 3 months (around 08/06/2021) for F/U with Pre-visit Labs, Meter, Logs, A1c here.Glade Lloyd, MD [facility] [address] [place], [place] [address] Phone: [phone]  Fax: [phone]    05/08/2021, 5:02 PM  This note was partially dictated with voice recognition software. Similar sounding words can be transcribed inadequately or may not  be corrected upon review.

## 2021-06-01 ENCOUNTER — Other Ambulatory Visit (HOSPITAL_COMMUNITY): Payer: Self-pay | Admitting: Family Medicine

## 2021-06-01 ENCOUNTER — Ambulatory Visit (HOSPITAL_COMMUNITY)
Admission: RE | Admit: 2021-06-01 | Discharge: 2021-06-01 | Disposition: A | Discharge status: Home / Self Care | Payer: BC Managed Care – PPO | Source: Ambulatory Visit | Attending: Family Medicine | Admitting: Family Medicine

## 2021-06-01 ENCOUNTER — Other Ambulatory Visit: Payer: Self-pay

## 2021-06-01 DIAGNOSIS — J90 Pleural effusion, not elsewhere classified: Secondary | ICD-10-CM | POA: Diagnosis not present

## 2021-06-01 DIAGNOSIS — J680 Bronchitis and pneumonitis due to chemicals, gases, fumes and vapors: Secondary | ICD-10-CM | POA: Diagnosis not present

## 2021-06-01 DIAGNOSIS — R079 Chest pain, unspecified: Secondary | ICD-10-CM | POA: Diagnosis not present

## 2021-06-01 DIAGNOSIS — J9809 Other diseases of bronchus, not elsewhere classified: Secondary | ICD-10-CM | POA: Diagnosis not present

## 2021-06-01 DIAGNOSIS — Z6828 Body mass index (BMI) 28.0-28.9, adult: Secondary | ICD-10-CM | POA: Diagnosis not present

## 2021-06-01 DIAGNOSIS — J22 Unspecified acute lower respiratory infection: Secondary | ICD-10-CM | POA: Diagnosis not present

## 2021-06-01 DIAGNOSIS — R053 Chronic cough: Secondary | ICD-10-CM | POA: Diagnosis not present

## 2021-06-01 DIAGNOSIS — E6609 Other obesity due to excess calories: Secondary | ICD-10-CM | POA: Diagnosis not present

## 2021-06-07 DIAGNOSIS — Z6828 Body mass index (BMI) 28.0-28.9, adult: Secondary | ICD-10-CM | POA: Diagnosis not present

## 2021-06-07 DIAGNOSIS — E663 Overweight: Secondary | ICD-10-CM | POA: Diagnosis not present

## 2021-06-07 DIAGNOSIS — J22 Unspecified acute lower respiratory infection: Secondary | ICD-10-CM | POA: Diagnosis not present

## 2021-07-11 ENCOUNTER — Other Ambulatory Visit: Payer: Self-pay | Admitting: "Endocrinology

## 2021-07-11 DIAGNOSIS — E1165 Type 2 diabetes mellitus with hyperglycemia: Secondary | ICD-10-CM

## 2021-07-25 DIAGNOSIS — E663 Overweight: Secondary | ICD-10-CM | POA: Diagnosis not present

## 2021-07-25 DIAGNOSIS — M25561 Pain in right knee: Secondary | ICD-10-CM | POA: Diagnosis not present

## 2021-07-25 DIAGNOSIS — Z6828 Body mass index (BMI) 28.0-28.9, adult: Secondary | ICD-10-CM | POA: Diagnosis not present

## 2021-07-25 DIAGNOSIS — M1711 Unilateral primary osteoarthritis, right knee: Secondary | ICD-10-CM | POA: Diagnosis not present

## 2021-08-07 ENCOUNTER — Other Ambulatory Visit: Payer: Self-pay

## 2021-08-07 ENCOUNTER — Ambulatory Visit: Payer: BC Managed Care – PPO | Admitting: "Endocrinology

## 2021-08-07 DIAGNOSIS — E1165 Type 2 diabetes mellitus with hyperglycemia: Secondary | ICD-10-CM

## 2021-08-07 MED ORDER — TRESIBA FLEXTOUCH 200 UNIT/ML ~~LOC~~ SOPN: 40.0000 [IU] | PEN_INJECTOR | Freq: Every day | SUBCUTANEOUS | 0 refills | Status: DC

## 2021-08-28 ENCOUNTER — Other Ambulatory Visit: Payer: Self-pay | Admitting: "Endocrinology

## 2021-08-28 DIAGNOSIS — E1165 Type 2 diabetes mellitus with hyperglycemia: Secondary | ICD-10-CM

## 2021-11-14 DIAGNOSIS — E1165 Type 2 diabetes mellitus with hyperglycemia: Secondary | ICD-10-CM | POA: Diagnosis not present

## 2021-11-15 LAB — LIPID PANEL
Chol/HDL Ratio: 5.7 ratio — ABNORMAL HIGH (ref 0.0–5.0)
Cholesterol, Total: 178 mg/dL (ref 100–199)
HDL: 31 mg/dL — ABNORMAL LOW (ref >39)
LDL Chol Calc (NIH): 79 mg/dL (ref 0–99)
Triglycerides: 423 mg/dL — ABNORMAL HIGH (ref 0–149)
VLDL Cholesterol Cal: 68 mg/dL — ABNORMAL HIGH (ref 5–40)

## 2021-11-15 LAB — COMPREHENSIVE METABOLIC PANEL
ALT: 22 IU/L (ref 0–44)
AST: 19 IU/L (ref 0–40)
Albumin/Globulin Ratio: 1.8 (ref 1.2–2.2)
Albumin: 4.4 g/dL (ref 3.9–4.9)
Alkaline Phosphatase: 85 IU/L (ref 44–121)
BUN/Creatinine Ratio: 21 (ref 10–24)
BUN: 25 mg/dL (ref 8–27)
Bilirubin Total: 0.6 mg/dL (ref 0.0–1.2)
CO2: 24 mmol/L (ref 20–29)
Calcium: 9.6 mg/dL (ref 8.6–10.2)
Chloride: 96 mmol/L (ref 96–106)
Creatinine, Ser: 1.17 mg/dL (ref 0.76–1.27)
Globulin, Total: 2.5 g/dL (ref 1.5–4.5)
Glucose: 262 mg/dL — ABNORMAL HIGH (ref 70–99)
Potassium: 4.5 mmol/L (ref 3.5–5.2)
Sodium: 133 mmol/L — ABNORMAL LOW (ref 134–144)
Total Protein: 6.9 g/dL (ref 6.0–8.5)
eGFR: 69 mL/min/{1.73_m2} (ref >59)

## 2021-11-15 LAB — TSH: TSH: 1.6 u[IU]/mL (ref 0.450–4.500)

## 2021-11-15 LAB — VITAMIN D 25 HYDROXY (VIT D DEFICIENCY, FRACTURES): Vit D, 25-Hydroxy: 13.7 ng/mL — ABNORMAL LOW (ref 30.0–100.0)

## 2021-11-15 LAB — T4, FREE: Free T4: 1.2 ng/dL (ref 0.82–1.77)

## 2021-11-20 ENCOUNTER — Encounter: Payer: Self-pay | Admitting: "Endocrinology

## 2021-11-20 ENCOUNTER — Ambulatory Visit (INDEPENDENT_AMBULATORY_CARE_PROVIDER_SITE_OTHER): Payer: BC Managed Care – PPO | Admitting: "Endocrinology

## 2021-11-20 VITALS — BP 128/84 | HR 92 | Ht 70.0 in | Wt 186.2 lb

## 2021-11-20 DIAGNOSIS — E1165 Type 2 diabetes mellitus with hyperglycemia: Secondary | ICD-10-CM

## 2021-11-20 DIAGNOSIS — E782 Mixed hyperlipidemia: Secondary | ICD-10-CM | POA: Diagnosis not present

## 2021-11-20 DIAGNOSIS — Z91199 Patient's noncompliance with other medical treatment and regimen due to unspecified reason: Secondary | ICD-10-CM

## 2021-11-20 DIAGNOSIS — I10 Essential (primary) hypertension: Secondary | ICD-10-CM | POA: Diagnosis not present

## 2021-11-20 DIAGNOSIS — E559 Vitamin D deficiency, unspecified: Secondary | ICD-10-CM

## 2021-11-20 LAB — POCT GLYCOSYLATED HEMOGLOBIN (HGB A1C): HbA1c, POC (controlled diabetic range): 12.9 % — AB (ref 0.0–7.0)

## 2021-11-20 MED ORDER — FREESTYLE LIBRE 3 SENSOR MISC: 1.0000 | 2 refills | Status: DC

## 2021-11-20 MED ORDER — TRESIBA FLEXTOUCH 200 UNIT/ML ~~LOC~~ SOPN: 50.0000 [IU] | PEN_INJECTOR | Freq: Every day | SUBCUTANEOUS | 1 refills | Status: DC

## 2021-11-20 NOTE — Patient Instructions (Signed)
                                     Advice for Weight Management  -For most of us the best way to lose weight is by diet management. Generally speaking, diet management means consuming less calories intentionally which over time brings about progressive weight loss.  This can be achieved more effectively by avoiding ultra processed carbohydrates, processed meats, unhealthy fats.    It is critically important to know your numbers: how much calorie you are consuming and how much calorie you need. More importantly, our carbohydrates sources should be unprocessed naturally occurring  complex starch food items.  It is always important to balance nutrition also by  appropriate intake of proteins (mainly plant-based), healthy fats/oils, plenty of fruits and vegetables.   -The American College of Lifestyle Medicine (ACL M) recommends nutrition derived mostly from Whole Food, Plant Predominant Sources example an apple instead of applesauce or apple pie. Eat Plenty of vegetables, Mushrooms, fruits, Legumes, Whole Grains, Nuts, seeds in lieu of processed meats, processed snacks/pastries red meat, poultry, eggs.  Use only water or unsweetened tea for hydration.  The College also recommends the need to stay away from risky substances including alcohol, smoking; obtaining 7-9 hours of restorative sleep, at least 150 minutes of moderate intensity exercise weekly, importance of healthy social connections, and being mindful of stress and seek help when it is overwhelming.    -Sticking to a routine mealtime to eat 3 meals a day and avoiding unnecessary snacks is shown to have a big role in weight control. Under normal circumstances, the only time we burn stored energy is when we are hungry, so allow  some hunger to take place- hunger means no food between appropriate meal times, only water.  It is not advisable to starve.   -It is better to avoid simple carbohydrates including:  Cakes, Sweet Desserts, Ice Cream, Soda (diet and regular), Sweet Tea, Candies, Chips, Cookies, Store Bought Juices, Alcohol in Excess of  1-2 drinks a day, Lemonade,  Artificial Sweeteners, Doughnuts, Coffee Creamers, "Sugar-free" Products, etc, etc.  This is not a complete list.....    -Consulting with certified diabetes educators is proven to provide you with the most accurate and current information on diet.  Also, you may be  interested in discussing diet options/exchanges , we can schedule a visit with Travis Sharp, RDN, CDE for individualized nutrition education.  -Exercise: If you are able: 30 -60 minutes a day ,4 days a week, or 150 minutes of moderate intensity exercise weekly.    The longer the better if tolerated.  Combine stretch, strength, and aerobic activities.  If you were told in the past that you have high risk for cardiovascular diseases, or if you are currently symptomatic, you may seek evaluation by your heart doctor prior to initiating moderate to intense exercise programs.                                  Additional Care Considerations for Diabetes/Prediabetes   -Diabetes  is a chronic disease.  The most important care consideration is regular follow-up with your diabetes care provider with the goal being avoiding or delaying its complications and to take advantage of advances in medications and technology.  If appropriate actions are taken early enough, type 2 diabetes can even be   reversed.  Seek information from the right source.  - Whole Food, Plant Predominant Nutrition is highly recommended: Eat Plenty of vegetables, Mushrooms, fruits, Legumes, Whole Grains, Nuts, seeds in lieu of processed meats, processed snacks/pastries red meat, poultry, eggs as recommended by American College of  Lifestyle Medicine (ACLM).  -Type 2 diabetes is known to coexist with other important comorbidities such as high blood pressure and high cholesterol.  It is critical to control not only the  diabetes but also the high blood pressure and high cholesterol to minimize and delay the risk of complications including coronary artery disease, stroke, amputations, blindness, etc.  The good news is that this diet recommendation for type 2 diabetes is also very helpful for managing high cholesterol and high blood blood pressure.  - Studies showed that people with diabetes will benefit from a class of medications known as ACE inhibitors and statins.  Unless there are specific reasons not to be on these medications, the standard of care is to consider getting one from these groups of medications at an optimal doses.  These medications are generally considered safe and proven to help protect the heart and the kidneys.    - People with diabetes are encouraged to initiate and maintain regular follow-up with eye doctors, foot doctors, dentists , and if necessary heart and kidney doctors.     - It is highly recommended that people with diabetes quit smoking or stay away from smoking, and get yearly  flu vaccine and pneumonia vaccine at least every 5 years.  See above for additional recommendations on exercise, sleep, stress management , and healthy social connections.      

## 2021-11-20 NOTE — Progress Notes (Signed)
11/20/2021, 5:16 PM   Endocrinology follow-up note  Subjective:    Patient ID: Travis Sharp, male    DOB: 1955-10-31.  Travis Sharp is being seen in follow-up after he was seen in consultation for management of currently uncontrolled symptomatic diabetes requested by  Cory Munch, PA-C.   Past Medical History:  Diagnosis Date   Colitis 03/23/2015   Diabetes mellitus    Gastritis    Hypertension     Past Surgical History:  Procedure Laterality Date   BIOPSY  05/10/2015   Procedure: BIOPSY;  Surgeon: Danie Binder, MD;  [facility]: [facility];  Service: Endoscopy;;  gastric bx's   COLONOSCOPY WITH PROPOFOL N/A 05/10/2015   dr. Oneida Alar: Mild diverticulosis, hemorrhoids.  Next colonoscopy 10 years   ESOPHAGOGASTRODUODENOSCOPY (EGD) WITH PROPOFOL N/A 05/10/2015   Dr. Oneida Alar: dysphagai due to stricture at [facility] s/p dilation, mild nonerosive gastritis.    SAVORY DILATION N/A 05/10/2015   Procedure: SAVORY DILATION;  Surgeon: Danie Binder, MD;  Location: [facility];  Service: Endoscopy;  Laterality: N/A;   TONSILLECTOMY      Social History   Socioeconomic History   Marital status: Married    Spouse name: Olin Hauser   Number of children: 3   Years of education: Not on file   Highest education level: Not on file  Occupational History   Occupation: [employer]  Tobacco Use   Smoking status: Former    Packs/day: 1.00    Years: 28.00    Total pack years: 28.00    Types: Cigarettes    Quit date: 05/04/2005    Years since quitting: 16.5   Smokeless tobacco: Never  Substance and Sexual Activity   Alcohol use: Not Currently    Alcohol/week: 0.0 standard drinks of alcohol    Comment: very seldom   Drug use: Not Currently    Types: Marijuana    Comment: denied 10/28/19   Sexual activity: Not on file  Other Topics Concern   Not on file  Social History Narrative   Not on file   Social Determinants of Health   Financial Resource Strain: Low  Risk  (10/21/2019)   Overall Financial Resource Strain (CARDIA)    Difficulty of Paying Living Expenses: Not hard at all  Food Insecurity: No Food Insecurity (10/21/2019)   Hunger Vital Sign    Worried About Running Out of Food in the Last Year: Never true    Ran Out of Food in the Last Year: Never true  Transportation Needs: No Transportation Needs (10/21/2019)   PRAPARE - Hydrologist (Medical): No    Lack of Transportation (Non-Medical): No  Physical Activity: Insufficiently Active (10/21/2019)   Exercise Vital Sign    Days of Exercise per Week: 3 days    Minutes of Exercise per Session: 30 min  Stress: No Stress Concern Present (10/21/2019)   [place]    Feeling of Stress : Only a little  Social Connections: Moderately Integrated (10/21/2019)   Social Connection and Isolation Panel [NHANES]    Frequency of Communication with Friends and Family: More than three times a week    Frequency of Social Gatherings with Friends and Family: More than three times a week    Attends Religious Services: 1 to 4 times per year    Active Member of [employer] or Organizations: No    Attends Club or  Organization Meetings: Never    Marital Status: Married    Family History  Problem Relation Age of Onset   Colon cancer Other        possibly 2 uncles   Stroke Mother    Aneurysm Mother    Hypertension Mother    Heart attack Father    Hypertension Sister    Hypertension Brother    Hypertension Daughter     Outpatient Encounter Medications as of 11/20/2021  Medication Sig   Continuous Blood Gluc Sensor (FREESTYLE LIBRE 3 SENSOR) MISC 1 Piece by Does not apply route every 14 (fourteen) days. Place 1 sensor on the skin every 14 days. Use to check glucose continuously   aspirin 81 MG tablet Take 81 mg by mouth daily.   [employer] D3 125 MCG (5000 UT) capsule TAKE 1 CAPSULE BY MOUTH EVERY DAY   glucose blood (ACCU-CHEK GUIDE)  test strip Use as instructed   insulin aspart (NOVOLOG) 100 UNIT/ML FlexPen Inject 1 Units into the skin 3 (three) times daily with meals. (Patient not taking: Reported on 05/08/2021)   insulin degludec (TRESIBA FLEXTOUCH) 200 UNIT/ML FlexTouch Pen Inject 50 Units into the skin at bedtime.   olmesartan-hydrochlorothiazide (BENICAR HCT) 40-25 MG tablet Take 1 tablet by mouth daily.   [DISCONTINUED] TRESIBA FLEXTOUCH 200 UNIT/ML FlexTouch Pen INJECT 40 UNITS INTO THE SKIN AT BEDTIME.   No facility-administered encounter medications on file as of 11/20/2021.    ALLERGIES: No Known Allergies  VACCINATION STATUS: Immunization History  Administered Date(s) Administered   Influenza,inj,Quad PF,6+ Mos 03/24/2015   Moderna Sars-Covid-2 Vaccination 06/16/2019, 07/14/2019   Pneumococcal Polysaccharide-23 03/24/2015    Diabetes He presents for his follow-up diabetic visit. He has type 2 diabetes mellitus. Onset time: He was diagnosed at approximate age of 59 years. His disease course has been worsening (This patient was seen prior to 2017 at which time his A1c was 6.1%.  He disappeared from care, returns with loss of control with A1c of>14.4%.). There are no hypoglycemic associated symptoms. Pertinent negatives for hypoglycemia include no confusion, headaches, pallor or seizures. Pertinent negatives for diabetes include no blurred vision, no chest pain, no fatigue, no polydipsia, no polyphagia, no polyuria and no weakness. There are no hypoglycemic complications. Symptoms are worsening. There are no diabetic complications. Risk factors for coronary artery disease include diabetes mellitus, hypertension, family history, male sex and tobacco exposure. Current diabetic treatment includes insulin injections. His weight is fluctuating minimally. He is following a generally unhealthy diet. When asked about meal planning, he reported none. He has not had a previous visit with a dietitian. He rarely participates in  exercise. His home blood glucose trend is increasing steadily. His overall blood glucose range is >200 mg/dl. (After missing his last appointment, he shows up with a meter showing rare readings of blood glucose.  He has 6 readings in the last 30 days averaging 369.  However, he insists that he has been taking Tresiba 40 units nightly.   He did not have hypoglycemia.  His point-of-care A1c is 12.9% increasing from 8.4% during his last visit. ) An ACE inhibitor/angiotensin II receptor blocker is being taken. Eye exam is not current.  Hypertension This is a chronic problem. The current episode started more than 1 year ago. Pertinent negatives include no blurred vision, chest pain, headaches, neck pain, palpitations or shortness of breath. Risk factors for coronary artery disease include diabetes mellitus, male gender, smoking/tobacco exposure and family history. Past treatments include angiotensin blockers.  Review of Systems  Constitutional:  Negative for chills, fatigue, fever and unexpected weight change.  HENT:  Negative for dental problem, mouth sores and trouble swallowing.   Eyes:  Negative for blurred vision and visual disturbance.  Respiratory:  Negative for cough, choking, chest tightness, shortness of breath and wheezing.   Cardiovascular:  Negative for chest pain, palpitations and leg swelling.  Gastrointestinal:  Negative for abdominal distention, abdominal pain, constipation, diarrhea, nausea and vomiting.  Endocrine: Negative for polydipsia, polyphagia and polyuria.  Genitourinary:  Negative for dysuria, flank pain, hematuria and urgency.  Musculoskeletal:  Negative for back pain, gait problem, myalgias and neck pain.  Skin:  Negative for pallor, rash and wound.  Neurological:  Negative for seizures, syncope, weakness, numbness and headaches.  Psychiatric/Behavioral:  Negative for confusion and dysphoric mood.     Objective:       11/20/2021    3:24 PM 05/08/2021    9:33 AM  11/17/2020    2:16 PM  Vitals with BMI  Height '5\' 10"'$  '5\' 10"'$  '5\' 10"'$   Weight 186 lbs 3 oz 184 lbs 10 oz 182 lbs  BMI 26.72 37.85 88.50  Systolic 277 412 878  Diastolic 84 96 96  Pulse 92 88 88    BP 128/84   Pulse 92   Ht '5\' 10"'$  (1.778 m)   Wt 186 lb 3.2 oz (84.5 kg)   BMI 26.72 kg/m   Wt Readings from Last 3 Encounters:  11/20/21 186 lb 3.2 oz (84.5 kg)  05/08/21 184 lb 9.6 oz (83.7 kg)  11/17/20 182 lb (82.6 kg)    Hesitant affect.     CMP ( most recent) CMP     Component Value Date/Time   NA 133 (L) 11/14/2021 1117   K 4.5 11/14/2021 1117   CL 96 11/14/2021 1117   CO2 24 11/14/2021 1117   GLUCOSE 262 (H) 11/14/2021 1117   GLUCOSE 225 (H) 10/12/2019 1322   BUN 25 11/14/2021 1117   CREATININE 1.17 11/14/2021 1117   CREATININE 0.89 10/26/2015 0758   CALCIUM 9.6 11/14/2021 1117   PROT 6.9 11/14/2021 1117   ALBUMIN 4.4 11/14/2021 1117   AST 19 11/14/2021 1117   ALT 22 11/14/2021 1117   ALKPHOS 85 11/14/2021 1117   BILITOT 0.6 11/14/2021 1117   GFRNONAA 61 10/21/2019 0000   GFRAA 70 10/21/2019 0000     Diabetic Labs (most recent): Lab Results  Component Value Date   HGBA1C 12.9 (A) 11/20/2021   HGBA1C 8.4 (A) 05/08/2021   HGBA1C 11.6 11/17/2020   MICROALBUR 0.5 10/26/2015     Lipid Panel ( most recent) Lipid Panel     Component Value Date/Time   CHOL 178 11/14/2021 1117   TRIG 423 (H) 11/14/2021 1117   HDL 31 (L) 11/14/2021 1117   CHOLHDL 5.7 (H) 11/14/2021 1117   CHOLHDL 3.0 10/26/2015 0758   VLDL 16 10/26/2015 0758   LDLCALC 79 11/14/2021 1117   LABVLDL 68 (H) 11/14/2021 1117      Lab Results  Component Value Date   TSH 1.600 11/14/2021   TSH 1.510 11/08/2020   TSH 1.62 10/21/2019   TSH 1.23 10/26/2015   FREET4 1.20 11/14/2021   FREET4 1.13 11/08/2020   FREET4 1.1 10/26/2015     Assessment & Plan:   1. Uncontrolled type 2 diabetes mellitus with hyperglycemia ([place])  - Travis Sharp has currently uncontrolled symptomatic type 2 DM  since  66 years of age. Travis Sharp remains alarmingly noncompliant.  After missing his last appointment, he shows up with a meter showing rare readings of blood glucose.  He has 6 readings in the last 30 days averaging 369.  However, he insists that he has been taking Tresiba 40 units nightly.   He did not have hypoglycemia.  His point-of-care A1c is 12.9% increasing from 8.4% during his last visit.    Recent labs reviewed.  - I had a long discussion with him about the progressive nature of diabetes and the pathology behind its complications. -his diabetes is complicated by nonfollow-up and he remains at a high risk for more acute and chronic complications which include CAD, CVA, CKD, retinopathy, and neuropathy. These are all discussed in detail with him.  - I have counseled him on diet  and weight management  by adopting a carbohydrate restricted/protein rich diet. Patient is encouraged to switch to  unprocessed or minimally processed     complex starch and increased protein intake (animal or plant source), fruits, and vegetables. -  he is advised to stick to a routine mealtimes to eat 3 meals  a day and avoid unnecessary snacks ( to snack only to correct hypoglycemia).   - he acknowledges that there is a room for improvement in his food and drink choices. - Suggestion is made for him to avoid simple carbohydrates  from his diet including Cakes, Sweet Desserts, Ice Cream, Soda (diet and regular), Sweet Tea, Candies, Chips, Cookies, Store Bought Juices, Alcohol , Artificial Sweeteners,  Coffee Creamer, and "Sugar-free" Products, Lemonade. This will help patient to have more stable blood glucose profile and potentially avoid unintended weight gain.  The following Lifestyle Medicine recommendations according to [place]  [facility]) were discussed and and offered to patient and he  agrees to start the journey:  A. Whole Foods, Plant-Based Nutrition comprising of fruits and  vegetables, plant-based proteins, whole-grain carbohydrates was discussed in detail with the patient.   A list for source of those nutrients were also provided to the patient.  Patient will use only water or unsweetened tea for hydration. B.  The need to stay away from risky substances including alcohol, smoking; obtaining 7 to 9 hours of restorative sleep, at least 150 minutes of moderate intensity exercise weekly, the importance of healthy social connections,  and stress management techniques were discussed. C.  A full color page of  Calorie density of various food groups per pound showing examples of each food groups was provided to the patient.   - he will be scheduled with Jearld Fenton, RDN, CDE for diabetes education.  - I have approached him with the following individualized plan to manage  his diabetes and patient agrees:  -In light of his presentation with loss of control of glycemia, he will likely need multiple daily injections of insulin in order for him to control and maintain control of diabetes to target.  Accordingly, his engagement to monitor is necessary.  I discussed and prescribed the freestyle libre device for him.  In the meantime, he is approached to start monitoring 4 times a day using his current meter.  He is advised to monitor blood glucose before meals, before lunch, before supper, and before.  And return in 1 week with  his meter and logs.    -He is advised to increase his [place] to 50 units nightly . - he is encouraged to call clinic for blood glucose levels less than 70 or above 200 mg /dl.  - he is  not a candidate for incretin therapy due to body habitus.  -He will need more work-up to classify his diabetes properly, and the possibility of pancreatic diabetes or LAD in this patient.  Anti-GAD and antiislet antibodies are ordered for him to do before his next visit.  - Specific targets for  A1c;  LDL, HDL,  and Triglycerides were discussed with the patient.  2)  Blood Pressure /Hypertension: His blood pressure is controlled to target he is coming off of a night shift. he is advised to continue his current medications including Benicar 40/25 mg p.o. daily with breakfast .  3) Lipids/Hyperlipidemia:   Review of his recent lipid panel showed uncontrolled  LDL at 107 .  he  is not on statins.  Will be considered on next visit.  The above diet will help manage his dyslipidemia as well.   4)  Weight/Diet:  Body mass index is 26.72 kg/m.  -      he is not a candidate for major weight loss.    Exercise, and detailed carbohydrates information provided  -  detailed on discharge instructions.  5) Chronic Care/Health Maintenance:  -he  is on ARB medications and  is encouraged to initiate and continue to follow up with Ophthalmology, Dentist, referred to ophthalmology, podiatrist at least yearly or according to recommendations, and advised to   stay away from smoking. I have recommended yearly flu vaccine and pneumonia vaccine at least every 5 years; moderate intensity exercise for up to 150 minutes weekly; and  sleep for at least 7 hours a day.  - he is  advised to maintain close follow up with Cory Munch, PA-C for primary care needs, as well as his other providers for optimal and coordinated care.    I spent 44 minutes in the care of the patient today including review of labs from [place], Lipids, Thyroid Function, Hematology (current and previous including abstractions from other facilities); face-to-face time discussing  his blood glucose readings/logs, discussing hypoglycemia and hyperglycemia episodes and symptoms, medications doses, his options of short and long term treatment based on the latest standards of care / guidelines;  discussion about incorporating lifestyle medicine;  and documenting the encounter.    Please refer to Patient Instructions for Blood Glucose Monitoring and Insulin/Medications Dosing Guide"  in media tab for additional information.  Please  also refer to " Patient Self Inventory" in the Media  tab for reviewed elements of pertinent patient history.  Travis Sharp participated in the discussions, expressed understanding, and voiced agreement with the above plans.  All questions were answered to his satisfaction. he is encouraged to contact clinic should he have any questions or concerns prior to his return visit.   Follow up plan: - Return in about 1 week (around 11/27/2021) for F/U with Meter/CGM [facility] Only - no Labs.  Glade Lloyd, MD [facility] [address] [place],  [address] Phone: [phone]  Fax: [phone]    11/20/2021, 5:16 PM  This note was partially dictated with voice recognition software. Similar sounding words can be transcribed inadequately or may not  be corrected upon review.

## 2021-11-27 ENCOUNTER — Encounter: Payer: Self-pay | Admitting: "Endocrinology

## 2021-11-27 ENCOUNTER — Ambulatory Visit (INDEPENDENT_AMBULATORY_CARE_PROVIDER_SITE_OTHER): Payer: BC Managed Care – PPO | Admitting: "Endocrinology

## 2021-11-27 VITALS — BP 150/88 | HR 68 | Ht 70.0 in | Wt 189.0 lb

## 2021-11-27 DIAGNOSIS — E782 Mixed hyperlipidemia: Secondary | ICD-10-CM

## 2021-11-27 DIAGNOSIS — E1165 Type 2 diabetes mellitus with hyperglycemia: Secondary | ICD-10-CM | POA: Diagnosis not present

## 2021-11-27 DIAGNOSIS — E559 Vitamin D deficiency, unspecified: Secondary | ICD-10-CM

## 2021-11-27 DIAGNOSIS — I10 Essential (primary) hypertension: Secondary | ICD-10-CM

## 2021-11-27 DIAGNOSIS — Z91199 Patient's noncompliance with other medical treatment and regimen due to unspecified reason: Secondary | ICD-10-CM

## 2021-11-27 MED ORDER — TRESIBA FLEXTOUCH 200 UNIT/ML ~~LOC~~ SOPN: 60.0000 [IU] | PEN_INJECTOR | Freq: Every day | SUBCUTANEOUS | 1 refills | Status: DC

## 2021-11-27 NOTE — Patient Instructions (Signed)

## 2021-11-27 NOTE — Progress Notes (Unsigned)
11/27/2021, 5:11 PM   Endocrinology follow-up note  Subjective:    Patient ID: Travis Sharp, male    DOB: 08-Jan-1956.  Travis Sharp is being seen in follow-up after he was seen in consultation for management of currently uncontrolled symptomatic diabetes requested by  Cory Munch, PA-C.   Past Medical History:  Diagnosis Date   Colitis 03/23/2015   Diabetes mellitus    Gastritis    Hypertension     Past Surgical History:  Procedure Laterality Date   BIOPSY  05/10/2015   Procedure: BIOPSY;  Surgeon: Danie Binder, MD;  Location: [facility];  Service: Endoscopy;;  gastric bx's   COLONOSCOPY WITH PROPOFOL N/A 05/10/2015   dr. Oneida Alar: Mild diverticulosis, hemorrhoids.  Next colonoscopy 10 years   ESOPHAGOGASTRODUODENOSCOPY (EGD) WITH PROPOFOL N/A 05/10/2015   Dr. Oneida Alar: dysphagai due to stricture at [facility] s/p dilation, mild nonerosive gastritis.    SAVORY DILATION N/A 05/10/2015   Procedure: SAVORY DILATION;  Surgeon: Danie Binder, MD;  Location: [facility];  Service: Endoscopy;  Laterality: N/A;   TONSILLECTOMY      Social History   Socioeconomic History   Marital status: Married    Spouse name: Olin Hauser   Number of children: 3   Years of education: Not on file   Highest education level: Not on file  Occupational History   Occupation: [employer]  Tobacco Use   Smoking status: Former    Packs/day: 1.00    Years: 28.00    Total pack years: 28.00    Types: Cigarettes    Quit date: 05/04/2005    Years since quitting: 16.5   Smokeless tobacco: Never  Substance and Sexual Activity   Alcohol use: Not Currently    Alcohol/week: 0.0 standard drinks of alcohol    Comment: very seldom   Drug use: Not Currently    Types: Marijuana    Comment: denied 10/28/19   Sexual activity: Not on file  Other Topics Concern   Not on file  Social History Narrative   Not on file   Social Determinants of Health   Financial Resource Strain: Low  Risk  (10/21/2019)   Overall Financial Resource Strain (CARDIA)    Difficulty of Paying Living Expenses: Not hard at all  Food Insecurity: No Food Insecurity (10/21/2019)   Hunger Vital Sign    Worried About Running Out of Food in the Last Year: Never true    Ran Out of Food in the Last Year: Never true  Transportation Needs: No Transportation Needs (10/21/2019)   PRAPARE - Hydrologist (Medical): No    Lack of Transportation (Non-Medical): No  Physical Activity: Insufficiently Active (10/21/2019)   Exercise Vital Sign    Days of Exercise per Week: 3 days    Minutes of Exercise per Session: 30 min  Stress: No Stress Concern Present (10/21/2019)   [place]    Feeling of Stress : Only a little  Social Connections: Moderately Integrated (10/21/2019)   Social Connection and Isolation Panel [NHANES]    Frequency of Communication with Friends and Family: More than three times a week    Frequency of Social Gatherings with Friends and Family: More than three times a week    Attends Religious Services: 1 to 4 times per year    Active Member of [employer] or Organizations: No    Attends Club or  Organization Meetings: Never    Marital Status: Married    Family History  Problem Relation Age of Onset   Colon cancer Other        possibly 2 uncles   Stroke Mother    Aneurysm Mother    Hypertension Mother    Heart attack Father    Hypertension Sister    Hypertension Brother    Hypertension Daughter     Outpatient Encounter Medications as of 11/27/2021  Medication Sig   aspirin 81 MG tablet Take 81 mg by mouth daily.   Continuous Blood Gluc Sensor (FREESTYLE LIBRE 3 SENSOR) MISC 1 Piece by Does not apply route every 14 (fourteen) days. Place 1 sensor on the skin every 14 days. Use to check glucose continuously   [facility] D3 125 MCG (5000 UT) capsule TAKE 1 CAPSULE BY MOUTH EVERY DAY   glucose blood (ACCU-CHEK GUIDE)  test strip Use as instructed   insulin aspart (NOVOLOG) 100 UNIT/ML FlexPen Inject 1 Units into the skin 3 (three) times daily with meals. (Patient not taking: Reported on 05/08/2021)   insulin degludec (TRESIBA FLEXTOUCH) 200 UNIT/ML FlexTouch Pen Inject 60 Units into the skin at bedtime.   olmesartan-hydrochlorothiazide (BENICAR HCT) 40-25 MG tablet Take 1 tablet by mouth daily.   [DISCONTINUED] insulin degludec (TRESIBA FLEXTOUCH) 200 UNIT/ML FlexTouch Pen Inject 50 Units into the skin at bedtime.   No facility-administered encounter medications on file as of 11/27/2021.    ALLERGIES: No Known Allergies  VACCINATION STATUS: Immunization History  Administered Date(s) Administered   Influenza,inj,Quad PF,6+ Mos 03/24/2015   Moderna Sars-Covid-2 Vaccination 06/16/2019, 07/14/2019   Pneumococcal Polysaccharide-23 03/24/2015    Diabetes He presents for his follow-up diabetic visit. He has type 2 diabetes mellitus. Onset time: He was diagnosed at approximate age of 52 years. His disease course has been worsening (This patient was seen prior to 2017 at which time his A1c was 6.1%.  He disappeared from care, returns with loss of control with A1c of>14.4%.). There are no hypoglycemic associated symptoms. Pertinent negatives for hypoglycemia include no confusion, headaches, pallor or seizures. Pertinent negatives for diabetes include no blurred vision, no chest pain, no fatigue, no polydipsia, no polyphagia, no polyuria and no weakness. There are no hypoglycemic complications. Symptoms are worsening. There are no diabetic complications. Risk factors for coronary artery disease include diabetes mellitus, hypertension, family history, male sex and tobacco exposure. Current diabetic treatment includes insulin injections. His weight is fluctuating minimally. He is following a generally unhealthy diet. When asked about meal planning, he reported none. He has not had a previous visit with a dietitian. He  rarely participates in exercise. His home blood glucose trend is increasing steadily. His overall blood glucose range is >200 mg/dl. (Patient did not go to the pharmacy to get a CGM, did not bring his meter.  He presents with a sheet of paper which has a few blood glucose readings which look like questionable.  The average blood glucose still above 200 mg per DL.  He did not document hypoglycemia.  He insists that he has injected [place] 50 units nightly.  His recent point-of-care A1c was 12.9% increasing from 8.4% during his prior visit.   ) An ACE inhibitor/angiotensin II receptor blocker is being taken. Eye exam is not current.  Hypertension This is a chronic problem. The current episode started more than 1 year ago. Pertinent negatives include no blurred vision, chest pain, headaches, neck pain, palpitations or shortness of breath. Risk factors for coronary artery  disease include diabetes mellitus, male gender, smoking/tobacco exposure and family history. Past treatments include angiotensin blockers.     Review of Systems  Constitutional:  Negative for chills, fatigue, fever and unexpected weight change.  HENT:  Negative for dental problem, mouth sores and trouble swallowing.   Eyes:  Negative for blurred vision and visual disturbance.  Respiratory:  Negative for cough, choking, chest tightness, shortness of breath and wheezing.   Cardiovascular:  Negative for chest pain, palpitations and leg swelling.  Gastrointestinal:  Negative for abdominal distention, abdominal pain, constipation, diarrhea, nausea and vomiting.  Endocrine: Negative for polydipsia, polyphagia and polyuria.  Genitourinary:  Negative for dysuria, flank pain, hematuria and urgency.  Musculoskeletal:  Negative for back pain, gait problem, myalgias and neck pain.  Skin:  Negative for pallor, rash and wound.  Neurological:  Negative for seizures, syncope, weakness, numbness and headaches.  Psychiatric/Behavioral:  Negative for  confusion and dysphoric mood.     Objective:       11/27/2021    3:41 PM 11/20/2021    3:24 PM 05/08/2021    9:33 AM  Vitals with BMI  Height '5\' 10"'$  '5\' 10"'$  '5\' 10"'$   Weight 189 lbs 186 lbs 3 oz 184 lbs 10 oz  BMI 27.12 40.08 67.61  Systolic 950 932 671  Diastolic 88 84 96  Pulse 68 92 88    BP (!) 150/88   Pulse 68   Ht '5\' 10"'$  (1.778 m)   Wt 189 lb (85.7 kg)   BMI 27.12 kg/m   Wt Readings from Last 3 Encounters:  11/27/21 189 lb (85.7 kg)  11/20/21 186 lb 3.2 oz (84.5 kg)  05/08/21 184 lb 9.6 oz (83.7 kg)    Hesitant affect.     CMP ( most recent) CMP     Component Value Date/Time   NA 133 (L) 11/14/2021 1117   K 4.5 11/14/2021 1117   CL 96 11/14/2021 1117   CO2 24 11/14/2021 1117   GLUCOSE 262 (H) 11/14/2021 1117   GLUCOSE 225 (H) 10/12/2019 1322   BUN 25 11/14/2021 1117   CREATININE 1.17 11/14/2021 1117   CREATININE 0.89 10/26/2015 0758   CALCIUM 9.6 11/14/2021 1117   PROT 6.9 11/14/2021 1117   ALBUMIN 4.4 11/14/2021 1117   AST 19 11/14/2021 1117   ALT 22 11/14/2021 1117   ALKPHOS 85 11/14/2021 1117   BILITOT 0.6 11/14/2021 1117   GFRNONAA 61 10/21/2019 0000   GFRAA 70 10/21/2019 0000     Diabetic Labs (most recent): Lab Results  Component Value Date   HGBA1C 12.9 (A) 11/20/2021   HGBA1C 8.4 (A) 05/08/2021   HGBA1C 11.6 11/17/2020   MICROALBUR 0.5 10/26/2015     Lipid Panel ( most recent) Lipid Panel     Component Value Date/Time   CHOL 178 11/14/2021 1117   TRIG 423 (H) 11/14/2021 1117   HDL 31 (L) 11/14/2021 1117   CHOLHDL 5.7 (H) 11/14/2021 1117   CHOLHDL 3.0 10/26/2015 0758   VLDL 16 10/26/2015 0758   LDLCALC 79 11/14/2021 1117   LABVLDL 68 (H) 11/14/2021 1117      Lab Results  Component Value Date   TSH 1.600 11/14/2021   TSH 1.510 11/08/2020   TSH 1.62 10/21/2019   TSH 1.23 10/26/2015   FREET4 1.20 11/14/2021   FREET4 1.13 11/08/2020   FREET4 1.1 10/26/2015     Assessment & Plan:   1. Uncontrolled type 2 diabetes  mellitus with hyperglycemia ([place])  - Legrande L Karczewski  has currently uncontrolled symptomatic type 2 DM since  66 years of age.  Patient did not go to the pharmacy to get a CGM, did not bring his meter.  He presents with a sheet of paper which has a few blood glucose readings which look like questionable.  The average blood glucose still above 200 mg per DL.  He did not document hypoglycemia.  He insists that he has injected [place] 50 units nightly.  His recent point-of-care A1c was 12.9% increasing from 8.4% during his prior visit.     Recent labs reviewed.  - I had a long discussion with him about the progressive nature of diabetes and the pathology behind its complications. -his diabetes is complicated by nonfollow-up and he remains at a high risk for more acute and chronic complications which include CAD, CVA, CKD, retinopathy, and neuropathy. These are all discussed in detail with him.  - I have counseled him on diet  and weight management  by adopting a carbohydrate restricted/protein rich diet. Patient is encouraged to switch to  unprocessed or minimally processed     complex starch and increased protein intake (animal or plant source), fruits, and vegetables. -  he is advised to stick to a routine mealtimes to eat 3 meals  a day and avoid unnecessary snacks ( to snack only to correct hypoglycemia).  - he acknowledges that there is a room for improvement in his food and drink choices. - Suggestion is made for him to avoid simple carbohydrates  from his diet including Cakes, Sweet Desserts, Ice Cream, Soda (diet and regular), Sweet Tea, Candies, Chips, Cookies, Store Bought Juices, Alcohol , Artificial Sweeteners,  Coffee Creamer, and "Sugar-free" Products, Lemonade. This will help patient to have more stable blood glucose profile and potentially avoid unintended weight gain.  The following Lifestyle Medicine recommendations according to [place]) were  discussed and and offered to patient and he  agrees to start the journey:  A. Whole Foods, Plant-Based Nutrition comprising of fruits and vegetables, plant-based proteins, whole-grain carbohydrates was discussed in detail with the patient.   A list for source of those nutrients were also provided to the patient.  Patient will use only water or unsweetened tea for hydration. B.  The need to stay away from risky substances including alcohol, smoking; obtaining 7 to 9 hours of restorative sleep, at least 150 minutes of moderate intensity exercise weekly, the importance of healthy social connections,  and stress management techniques were discussed. C.  A full color page of  Calorie density of various food groups per pound showing examples of each food groups was provided to the patient.   - he will be scheduled with Jearld Fenton, RDN, CDE for diabetes education.  - I have approached him with the following individualized plan to manage  his diabetes and patient agrees:  -Patient remains alarmingly noncompliant.  in light of his presentation with hyperglycemic burden, she will need a higher dose of insulin in order for him to control diabetes to target.    Accordingly, his engagement to monitor is necessary.  I advised him to go to the pharmacy to get a CGM.  Meanwhile, he is encouraged to use his meter to test blood glucose 4 times a day-daily before breakfast, lunch, supper and bedtime.    He is advised to increase his [place] to 60 units nightly.  If he presents with continued glycemic burden, he will be considered for prandial insulin. - he  is encouraged to call clinic for blood glucose levels less than 70 or above 200 mg /dl.  - he is not a candidate for incretin therapy due to body habitus.  -He will need more work-up to classify his diabetes properly, and the possibility of pancreatic diabetes or LAD in this patient.  Anti-GAD and antiislet antibodies are ordered for him to do before his next  visit.  - Specific targets for  A1c;  LDL, HDL,  and Triglycerides were discussed with the patient.  2) Blood Pressure /Hypertension: His blood pressure is controlled to target he is coming off of a night shift. he is advised to continue his current medications including Benicar 40/25 mg p.o. daily with breakfast .  3) Lipids/Hyperlipidemia:   Review of his recent lipid panel showed improved LDL at 79.      The above diet will help manage his dyslipidemia as well.   4)  Weight/Diet:  Body mass index is 27.12 kg/m.  -      he is not a candidate for major weight loss.    Exercise, and detailed carbohydrates information provided  -  detailed on discharge instructions.  5) Chronic Care/Health Maintenance:  -he  is on ARB medications and  is encouraged to initiate and continue to follow up with Ophthalmology, Dentist, referred to ophthalmology, podiatrist at least yearly or according to recommendations, and advised to   stay away from smoking. I have recommended yearly flu vaccine and pneumonia vaccine at least every 5 years; moderate intensity exercise for up to 150 minutes weekly; and  sleep for at least 7 hours a day.  - he is  advised to maintain close follow up with Cory Munch, PA-C for primary care needs, as well as his other providers for optimal and coordinated care.    I spent 41 minutes in the care of the patient today including review of labs from [place], Lipids, Thyroid Function, Hematology (current and previous including abstractions from other facilities); face-to-face time discussing  his blood glucose readings/logs, discussing hypoglycemia and hyperglycemia episodes and symptoms, medications doses, his options of short and long term treatment based on the latest standards of care / guidelines;  discussion about incorporating lifestyle medicine;  and documenting the encounter. Risk reduction counseling performed per USPSTF guidelines to reduce obesity and cardiovascular risk factors.      Please refer to Patient Instructions for Blood Glucose Monitoring and Insulin/Medications Dosing Guide"  in media tab for additional information. Please  also refer to " Patient Self Inventory" in the Media  tab for reviewed elements of pertinent patient history.  Travis Sharp participated in the discussions, expressed understanding, and voiced agreement with the above plans.  All questions were answered to his satisfaction. he is encouraged to contact clinic should he have any questions or concerns prior to his return visit.    Follow up plan: - Return in about 4 weeks (around 12/25/2021) for F/U with Meter/CGM [facility] Simon Only - no Labs.  Glade Lloyd, MD [facility] [address] [place], [place] [address] Phone: [phone]  Fax: [phone]    11/27/2021, 5:11 PM  This note was partially dictated with voice recognition software. Similar sounding words can be transcribed inadequately or may not  be corrected upon review.

## 2021-12-02 ENCOUNTER — Other Ambulatory Visit: Payer: Self-pay | Admitting: "Endocrinology

## 2021-12-21 ENCOUNTER — Ambulatory Visit: Payer: BC Managed Care – PPO

## 2021-12-21 ENCOUNTER — Other Ambulatory Visit: Payer: Self-pay

## 2021-12-21 MED ORDER — FREESTYLE LIBRE READER DEVI: 0 refills | Status: DC

## 2021-12-25 ENCOUNTER — Ambulatory Visit: Payer: BC Managed Care – PPO | Admitting: "Endocrinology

## 2021-12-25 ENCOUNTER — Telehealth: Payer: Self-pay | Admitting: "Endocrinology

## 2021-12-25 DIAGNOSIS — E1165 Type 2 diabetes mellitus with hyperglycemia: Secondary | ICD-10-CM

## 2021-12-25 NOTE — Telephone Encounter (Signed)
New message    Patient asking for a call back to discuss his meter.

## 2021-12-26 NOTE — Telephone Encounter (Signed)
Left a message requesting pt to return call to the office. 

## 2021-12-27 NOTE — Telephone Encounter (Signed)
Left a message requesting pt return call to the office. ?

## 2021-12-27 NOTE — Telephone Encounter (Signed)
Pt is asking for a call back about his [place].

## 2022-01-10 MED ORDER — FREESTYLE LIBRE 2 SENSOR MISC: 2 refills | Status: DC

## 2022-01-10 MED ORDER — FREESTYLE LIBRE 2 READER DEVI: 0 refills | Status: DC

## 2022-01-10 NOTE — Telephone Encounter (Signed)
Pt has an appointment tomorrow but has not been able to use his CGM because his phone is not compatible with the [facility] 3, pt has no readings. Sent in Rx for [place] 2 reader [facility] and advised pt we would need to reschedule him for 2 weeks so he will have readings to bring to visit. Pt voiced understanding.

## 2022-01-10 NOTE — Addendum Note (Signed)
Addended by: Ellin Saba on: 01/10/2022 02:29 PM   Modules accepted: Orders

## 2022-01-10 NOTE — Telephone Encounter (Signed)
Please call pt at [phone]. Pt came by office and he said that he has questions about his [place]enor [place]. Thank you

## 2022-01-11 ENCOUNTER — Ambulatory Visit: Payer: BC Managed Care – PPO | Admitting: "Endocrinology

## 2022-01-15 ENCOUNTER — Emergency Department (HOSPITAL_COMMUNITY)
Admission: EM | Admit: 2022-01-15 | Discharge: 2022-01-16 | Disposition: A | Discharge status: Home / Self Care | Payer: HMO | Attending: Emergency Medicine | Admitting: Emergency Medicine

## 2022-01-15 ENCOUNTER — Other Ambulatory Visit: Payer: Self-pay

## 2022-01-15 ENCOUNTER — Encounter (HOSPITAL_COMMUNITY): Payer: Self-pay

## 2022-01-15 DIAGNOSIS — E119 Type 2 diabetes mellitus without complications: Secondary | ICD-10-CM | POA: Diagnosis not present

## 2022-01-15 DIAGNOSIS — R109 Unspecified abdominal pain: Secondary | ICD-10-CM | POA: Insufficient documentation

## 2022-01-15 DIAGNOSIS — Z87891 Personal history of nicotine dependence: Secondary | ICD-10-CM | POA: Diagnosis not present

## 2022-01-15 DIAGNOSIS — R112 Nausea with vomiting, unspecified: Secondary | ICD-10-CM

## 2022-01-15 DIAGNOSIS — I10 Essential (primary) hypertension: Secondary | ICD-10-CM | POA: Diagnosis not present

## 2022-01-15 LAB — CBG MONITORING, ED: Glucose-Capillary: 183 mg/dL — ABNORMAL HIGH (ref 70–99)

## 2022-01-15 MED ORDER — LACTATED RINGERS IV BOLUS
1000.0000 mL | Freq: Once | INTRAVENOUS | Status: AC
  Administered 2022-01-16: 1000 mL via INTRAVENOUS

## 2022-01-15 MED ORDER — ONDANSETRON 4 MG PO TBDP
4.0000 mg | ORAL_TABLET | Freq: Once | ORAL | Status: AC
Start: 2022-01-15 — End: 2022-01-15
  Administered 2022-01-15: 4 mg via ORAL
  Filled 2022-01-15: qty 1

## 2022-01-15 MED ORDER — FAMOTIDINE IN NACL 20-0.9 MG/50ML-% IV SOLN
20.0000 mg | Freq: Once | INTRAVENOUS | Status: AC
  Administered 2022-01-16: 20 mg via INTRAVENOUS
  Filled 2022-01-15: qty 50

## 2022-01-15 MED ORDER — HALOPERIDOL LACTATE 5 MG/ML IJ SOLN
2.0000 mg | Freq: Once | INTRAMUSCULAR | Status: AC
  Administered 2022-01-16: 2 mg via INTRAVENOUS
  Filled 2022-01-15: qty 1

## 2022-01-15 NOTE — ED Triage Notes (Signed)
Pov from home cc of heavy emesis for the last 2 hours.  Has happened before due to cannabis. Denies use today.

## 2022-01-16 ENCOUNTER — Emergency Department (HOSPITAL_COMMUNITY): Payer: HMO

## 2022-01-16 DIAGNOSIS — R112 Nausea with vomiting, unspecified: Secondary | ICD-10-CM | POA: Diagnosis not present

## 2022-01-16 LAB — COMPREHENSIVE METABOLIC PANEL
ALT: 23 U/L (ref 0–44)
AST: 33 U/L (ref 15–41)
Albumin: 4.6 g/dL (ref 3.5–5.0)
Alkaline Phosphatase: 51 U/L (ref 38–126)
Anion gap: 12 (ref 5–15)
BUN: 15 mg/dL (ref 8–23)
CO2: 19 mmol/L — ABNORMAL LOW (ref 22–32)
Calcium: 9.5 mg/dL (ref 8.9–10.3)
Chloride: 107 mmol/L (ref 98–111)
Creatinine, Ser: 1.03 mg/dL (ref 0.61–1.24)
GFR, Estimated: >60 mL/min (ref >60)
Glucose, Bld: 195 mg/dL — ABNORMAL HIGH (ref 70–99)
Potassium: 3.4 mmol/L — ABNORMAL LOW (ref 3.5–5.1)
Sodium: 138 mmol/L (ref 135–145)
Total Bilirubin: 1.4 mg/dL — ABNORMAL HIGH (ref 0.3–1.2)
Total Protein: 8.3 g/dL — ABNORMAL HIGH (ref 6.5–8.1)

## 2022-01-16 LAB — CBC
HCT: 43.9 % (ref 39.0–52.0)
Hemoglobin: 15.5 g/dL (ref 13.0–17.0)
MCH: 30.7 pg (ref 26.0–34.0)
MCHC: 35.3 g/dL (ref 30.0–36.0)
MCV: 86.9 fL (ref 80.0–100.0)
Platelets: 219 10*3/uL (ref 150–400)
RBC: 5.05 MIL/uL (ref 4.22–5.81)
RDW: 13.7 % (ref 11.5–15.5)
WBC: 15.3 10*3/uL — ABNORMAL HIGH (ref 4.0–10.5)
nRBC: 0 % (ref 0.0–0.2)

## 2022-01-16 LAB — LIPASE, BLOOD: Lipase: 22 U/L (ref 11–51)

## 2022-01-16 MED ORDER — ONDANSETRON HCL 4 MG/2ML IJ SOLN
4.0000 mg | Freq: Once | INTRAMUSCULAR | Status: AC
  Administered 2022-01-16: 4 mg via INTRAVENOUS
  Filled 2022-01-16: qty 2

## 2022-01-16 MED ORDER — ONDANSETRON 4 MG PO TBDP: 4.0000 mg | ORAL_TABLET | Freq: Three times a day (TID) | ORAL | 0 refills | Status: DC | PRN

## 2022-01-16 NOTE — Discharge Instructions (Addendum)
You were evaluated in the Emergency Department and after careful evaluation, we did not find any emergent condition requiring admission or further testing in the hospital.  Your exam/testing today is overall reassuring.  Recommend avoiding marijuana or any THC products for the next 2 or 3 months to see if this helps.  Can use the Zofran as needed for nausea.  Please return to the Emergency Department if you experience any worsening of your condition.   Thank you for allowing [place] to be a part of your care.

## 2022-01-16 NOTE — ED Provider Notes (Signed)
[facility] Emergency Department Provider Note MRN:  [id number]  Arrival date & time: 01/16/22     Chief Complaint   Emesis   History of Present Illness   Travis Sharp is a 66 y.o. year-old male with a history of hypertension, diabetes presenting to the ED with chief complaint of emesis.  Severe nausea vomiting, abdominal discomfort for the past several hours.  Has done this before, history of marijuana hyperemesis.  Patient is actively vomiting.  Review of Systems  A thorough review of systems was obtained and all systems are negative except as noted in the HPI and PMH.   Patient's Health History    Past Medical History:  Diagnosis Date   Colitis 03/23/2015   Diabetes mellitus    Gastritis    Hypertension     Past Surgical History:  Procedure Laterality Date   BIOPSY  05/10/2015   Procedure: BIOPSY;  Surgeon: Danie Binder, MD;  [facility];  Service: Endoscopy;;  gastric bx's   COLONOSCOPY WITH PROPOFOL N/A 05/10/2015   dr. Oneida Alar: Mild diverticulosis, hemorrhoids.  Next colonoscopy 10 years   ESOPHAGOGASTRODUODENOSCOPY (EGD) WITH PROPOFOL N/A 05/10/2015   Dr. Oneida Alar: dysphagai due to stricture at [facility] s/p dilation, mild nonerosive gastritis.    SAVORY DILATION N/A 05/10/2015   Procedure: SAVORY DILATION;  Surgeon: Danie Binder, MD;  Location[facility];  Service: Endoscopy;  Laterality: N/A;   TONSILLECTOMY      Family History  Problem Relation Age of Onset   Colon cancer Other        possibly 2 uncles   Stroke Mother    Aneurysm Mother    Hypertension Mother    Heart attack Father    Hypertension Sister    Hypertension Brother    Hypertension Daughter     Social History   Socioeconomic History   Marital status: Married    Spouse name: Olin Hauser   Number of children: 3   Years of education: Not on file   Highest education level: Not on file  Occupational History   Occupation: [employer]  Tobacco Use   Smoking  status: Former    Packs/day: 1.00    Years: 28.00    Total pack years: 28.00    Types: Cigarettes    Quit date: 05/04/2005    Years since quitting: 16.7   Smokeless tobacco: Never  Substance and Sexual Activity   Alcohol use: Not Currently    Alcohol/week: 0.0 standard drinks of alcohol    Comment: very seldom   Drug use: Not Currently    Types: Marijuana    Comment: denied 10/28/19   Sexual activity: Not on file  Other Topics Concern   Not on file  Social History Narrative   Not on file   Social Determinants of Health   Financial Resource Strain: Low Risk  (10/21/2019)   Overall Financial Resource Strain (CARDIA)    Difficulty of Paying Living Expenses: Not hard at all  Food Insecurity: No Food Insecurity (10/21/2019)   Hunger Vital Sign    Worried About Running Out of Food in the Last Year: Never true    [place] in the Last Year: Never true  Transportation Needs: No Transportation Needs (10/21/2019)   PRAPARE - Hydrologist (Medical): No    Lack of Transportation (Non-Medical): No  Physical Activity: Insufficiently Active (10/21/2019)   Exercise Vital Sign    Days of Exercise per  Week: 3 days    Minutes of Exercise per Session: 30 min  Stress: No Stress Concern Present (10/21/2019)   [place]    Feeling of Stress : Only a little  Social Connections: Moderately Integrated (10/21/2019)   Social Connection and Isolation Panel [NHANES]    Frequency of Communication with Friends and Family: More than three times a week    Frequency of Social Gatherings with Friends and Family: More than three times a week    Attends Religious Services: 1 to 4 times per year    Active Member of [employer] or Organizations: No    Attends Archivist Meetings: Never    Marital Status: Married  Human resources officer Violence: Not At Risk (10/21/2019)   Humiliation, Afraid, Rape, and Kick questionnaire     Fear of Current or Ex-Partner: No    Emotionally Abused: No    Physically Abused: No    Sexually Abused: No     Physical Exam   Vitals:   01/16/22 0100 01/16/22 0130  BP: (!) 184/92 (!) 186/94  Pulse: 82 84  Resp: 19 19  Temp:    SpO2: 99% 98%    CONSTITUTIONAL: Well-appearing, NAD NEURO/PSYCH:  Alert and oriented x 3, no focal deficits EYES:  eyes equal and reactive ENT/NECK:  no LAD, no JVD CARDIO: Regular rate, well-perfused, normal S1 and S2 PULM:  CTAB no wheezing or rhonchi GI/GU:  non-distended, non-tender MSK/SPINE:  No gross deformities, no edema SKIN:  no rash, atraumatic   *Additional and/or pertinent findings included in MDM below  Diagnostic and Interventional Summary    EKG Interpretation  Date/Time:    Ventricular Rate:    PR Interval:    QRS Duration:   QT Interval:    QTC Calculation:   R Axis:     Text Interpretation:         Labs Reviewed  CBC - Abnormal; Notable for the following components:      Result Value   WBC 15.3 (*)    All other components within normal limits  COMPREHENSIVE METABOLIC PANEL - Abnormal; Notable for the following components:   Potassium 3.4 (*)    CO2 19 (*)    Glucose, Bld 195 (*)    Total Protein 8.3 (*)    Total Bilirubin 1.4 (*)    All other components within normal limits  CBG MONITORING, ED - Abnormal; Notable for the following components:   Glucose-Capillary 183 (*)    All other components within normal limits  LIPASE, BLOOD    DG Chest Port 1 View  Final Result      Medications  ondansetron (ZOFRAN) injection 4 mg (has no administration in time range)  ondansetron (ZOFRAN-ODT) disintegrating tablet 4 mg (4 mg Oral Given 01/15/22 2007)  lactated ringers bolus 1,000 mL (0 mLs Intravenous Stopped 01/16/22 0218)  lactated ringers bolus 1,000 mL (0 mLs Intravenous Stopped 01/16/22 0218)  haloperidol lactate (HALDOL) injection 2 mg (2 mg Intravenous Given 01/16/22 0002)  famotidine (PEPCID) IVPB 20 mg  premix (0 mg Intravenous Stopped 01/16/22 0036)     Procedures  /  Critical Care Procedures  ED Course and Medical Decision Making  Initial Impression and Ddx Suspicious for marijuana hyperemesis, actively retching, providing fluids, Haldol, will reassess.  Past medical/surgical history that increases complexity of ED encounter: History of marijuana hyperemesis  Interpretation of Diagnostics I personally reviewed the Chest Xray and my interpretation is as follows: No obvious  pneumothorax or opacity or subdiaphragmatic air  Labs reassuring with no significant blood count or electrolyte disturbance  Patient Reassessment and Ultimate Disposition/Management     Patient feeling much better after medications listed above, wants to go home.  Abdomen soft and nontender, nothing to suggest emergent process at this time, appropriate for discharge.  Patient management required discussion with the following services or consulting groups:  None  Complexity of Problems Addressed Acute illness or injury that poses threat of life of bodily function  Additional Data Reviewed and Analyzed Further history obtained from: Further history from spouse/family member  Additional Factors Impacting ED Encounter Risk Prescriptions and Use of parenteral controlled substances  Barth Kirks. [place] Small, [place] [email or web address]  Final Clinical Impressions(s) / ED Diagnoses     ICD-10-CM   1. Nausea and vomiting, unspecified vomiting type  R11.2       ED Discharge Orders          Ordered    ondansetron (ZOFRAN-ODT) 4 MG disintegrating tablet  Every 8 hours PRN        01/16/22 0231             Discharge Instructions Discussed with and Provided to Patient:     Discharge Instructions      You were evaluated in the Emergency Department and after careful evaluation, we did not find any emergent condition requiring admission or further testing  in the hospital.  Your exam/testing today is overall reassuring.  Recommend avoiding marijuana or any THC products for the next 2 or 3 months to see if this helps.  Can use the Zofran as needed for nausea.  Please return to the Emergency Department if you experience any worsening of your condition.   Thank you for allowing [place] to be a part of your care.       Maudie Flakes, MD 01/16/22 [phone]

## 2022-01-24 ENCOUNTER — Encounter: Payer: Self-pay | Admitting: "Endocrinology

## 2022-01-24 ENCOUNTER — Ambulatory Visit (INDEPENDENT_AMBULATORY_CARE_PROVIDER_SITE_OTHER): Payer: BC Managed Care – PPO | Admitting: "Endocrinology

## 2022-01-24 VITALS — BP 130/90 | HR 76 | Ht 70.0 in | Wt 190.8 lb

## 2022-01-24 DIAGNOSIS — E782 Mixed hyperlipidemia: Secondary | ICD-10-CM | POA: Diagnosis not present

## 2022-01-24 DIAGNOSIS — E559 Vitamin D deficiency, unspecified: Secondary | ICD-10-CM | POA: Diagnosis not present

## 2022-01-24 DIAGNOSIS — I10 Essential (primary) hypertension: Secondary | ICD-10-CM | POA: Diagnosis not present

## 2022-01-24 DIAGNOSIS — E1165 Type 2 diabetes mellitus with hyperglycemia: Secondary | ICD-10-CM | POA: Diagnosis not present

## 2022-01-24 MED ORDER — TRESIBA FLEXTOUCH 200 UNIT/ML ~~LOC~~ SOPN: 50.0000 [IU] | PEN_INJECTOR | Freq: Every day | SUBCUTANEOUS | 1 refills | Status: DC

## 2022-01-24 NOTE — Patient Instructions (Signed)

## 2022-01-24 NOTE — Progress Notes (Signed)
01/24/2022, 5:11 PM   Endocrinology follow-up note  Subjective:    Patient ID: Travis Sharp, male    DOB: [birth year].  Travis Sharp is being seen in follow-up after he was seen in consultation for management of currently uncontrolled symptomatic diabetes requested by  Cory Munch, PA-C.   Past Medical History:  Diagnosis Date   Colitis 03/23/2015   Diabetes mellitus    Gastritis    Hypertension     Past Surgical History:  Procedure Laterality Date   BIOPSY  05/10/2015   Procedure: BIOPSY;  Surgeon: Danie Binder, MD;  [facility];  Service: Endoscopy;;  gastric bx's   COLONOSCOPY WITH PROPOFOL N/A 05/10/2015   dr. Oneida Alar: Mild diverticulosis, hemorrhoids.  Next colonoscopy 10 years   ESOPHAGOGASTRODUODENOSCOPY (EGD) WITH PROPOFOL N/A 05/10/2015   Dr. Oneida Alar: dysphagai due to stricture at [facility] s/p dilation, mild nonerosive gastritis.    SAVORY DILATION N/A 05/10/2015   Procedure: SAVORY DILATION;  Surgeon: Danie Binder, MD;  [facility];  Service: Endoscopy;  Laterality: N/A;   TONSILLECTOMY      Social History   Socioeconomic History   Marital status: Married    Spouse name: Travis Sharp   Number of children: 3   Years of education: Not on file   Highest education level: Not on file  Occupational History   Occupation: [employer]  Tobacco Use   Smoking status: Former    Packs/day: 1.00    Years: 28.00    Total pack years: 28.00    Types: Cigarettes    Quit date: 05/04/2005    Years since quitting: 16.7   Smokeless tobacco: Never  Substance and Sexual Activity   Alcohol use: Not Currently    Alcohol/week: 0.0 standard drinks of alcohol    Comment: very seldom   Drug use: Not Currently    Types: Marijuana    Comment: denied 10/28/19   Sexual activity: Not on file  Other Topics Concern   Not on file  Social History Narrative   Not on file   Social Determinants of Health   Financial Resource Strain: Low  Risk  (10/21/2019)   Overall Financial Resource Strain (CARDIA)    Difficulty of Paying Living Expenses: Not hard at all  Food Insecurity: No Food Insecurity (10/21/2019)   Hunger Vital Sign    Worried About Running Out of Food in the Last Year: Never true    Ran Out of Food in the Last Year: Never true  Transportation Needs: No Transportation Needs (10/21/2019)   PRAPARE - Hydrologist (Medical): No    Lack of Transportation (Non-Medical): No  Physical Activity: Insufficiently Active (10/21/2019)   Exercise Vital Sign    Days of Exercise per Week: 3 days    Minutes of Exercise per Session: 30 min  Stress: No Stress Concern Present (10/21/2019)   [place]    Feeling of Stress : Only a little  Social Connections: Moderately Integrated (10/21/2019)   Social Connection and Isolation Panel [NHANES]    Frequency of Communication with Friends and Family: More than three times a week    Frequency of Social Gatherings with Friends and Family: More than three times a week    Attends Religious Services: 1 to 4 times per year    Active Member of [employer] or Organizations: No    Attends Club or  Organization Meetings: Never    Marital Status: Married    Family History  Problem Relation Age of Onset   Colon cancer Other        possibly 2 uncles   Stroke Mother    Aneurysm Mother    Hypertension Mother    Heart attack Father    Hypertension Sister    Hypertension Brother    Hypertension Daughter     Outpatient Encounter Medications as of 01/24/2022  Medication Sig   aspirin 81 MG tablet Take 81 mg by mouth daily.   Continuous Blood Gluc Receiver (FREESTYLE LIBRE 2 READER) DEVI Use to check glucose four times daily as instructed   Continuous Blood Gluc Sensor (FREESTYLE LIBRE 2 SENSOR) MISC Use to check glucose four times daily as instructed. Change sensor every 14 days   [employer] D3 125 MCG (5000 UT) capsule  TAKE 1 CAPSULE BY MOUTH EVERY DAY   glucose blood (ACCU-CHEK GUIDE) test strip Use as instructed   insulin aspart (NOVOLOG) 100 UNIT/ML FlexPen Inject 1 Units into the skin 3 (three) times daily with meals. (Patient not taking: Reported on 05/08/2021)   insulin degludec (TRESIBA FLEXTOUCH) 200 UNIT/ML FlexTouch Pen Inject 50 Units into the skin at bedtime.   olmesartan-hydrochlorothiazide (BENICAR HCT) 40-25 MG tablet Take 1 tablet by mouth daily.   ondansetron (ZOFRAN-ODT) 4 MG disintegrating tablet Take 1 tablet (4 mg total) by mouth every 8 (eight) hours as needed for nausea or vomiting.   [DISCONTINUED] insulin degludec (TRESIBA FLEXTOUCH) 200 UNIT/ML FlexTouch Pen Inject 60 Units into the skin at bedtime.   No facility-administered encounter medications on file as of 01/24/2022.    ALLERGIES: No Known Allergies  VACCINATION STATUS: Immunization History  Administered Date(s) Administered   Influenza,inj,Quad PF,6+ Mos 03/24/2015   Moderna Sars-Covid-2 Vaccination 06/16/2019, 07/14/2019   Pneumococcal Polysaccharide-23 03/24/2015    Diabetes He presents for his follow-up diabetic visit. He has type 2 diabetes mellitus. Onset time: He was diagnosed at approximate age of 66 years. His disease course has been improving. There are no hypoglycemic associated symptoms. Pertinent negatives for hypoglycemia include no confusion, headaches, pallor or seizures. Pertinent negatives for diabetes include no blurred vision, no chest pain, no fatigue, no polydipsia, no polyphagia, no polyuria and no weakness. There are no hypoglycemic complications. Symptoms are improving. There are no diabetic complications. Risk factors for coronary artery disease include diabetes mellitus, hypertension, family history, male sex and tobacco exposure. Current diabetic treatment includes insulin injections. His weight is stable. He is following a generally unhealthy diet. When asked about meal planning, he reported none.  He has not had a previous visit with a dietitian. He rarely participates in exercise. His home blood glucose trend is decreasing steadily. His breakfast blood glucose range is generally 130-140 mg/dl. His lunch blood glucose range is generally 130-140 mg/dl. His dinner blood glucose range is generally 130-140 mg/dl. His bedtime blood glucose range is generally 130-140 mg/dl. His overall blood glucose range is 130-140 mg/dl. (Patient did get his freestyle libre device.  He is benefiting from this device.  He presents with improved glycemic profile.  90% time range, 10% level 1 hyperglycemia.  0% hypoglycemia.  This is in sharp contrast from A1c of 12.9% during his last visit.     ) An ACE inhibitor/angiotensin II receptor blocker is being taken. Eye exam is not current.  Hypertension This is a chronic problem. The current episode started more than 1 year ago. Pertinent negatives include no blurred vision, chest  pain, headaches, neck pain, palpitations or shortness of breath. Risk factors for coronary artery disease include diabetes mellitus, male gender, smoking/tobacco exposure and family history. Past treatments include angiotensin blockers.     Review of Systems  Constitutional:  Negative for chills, fatigue, fever and unexpected weight change.  HENT:  Negative for dental problem, mouth sores and trouble swallowing.   Eyes:  Negative for blurred vision and visual disturbance.  Respiratory:  Negative for cough, choking, chest tightness, shortness of breath and wheezing.   Cardiovascular:  Negative for chest pain, palpitations and leg swelling.  Gastrointestinal:  Negative for abdominal distention, abdominal pain, constipation, diarrhea, nausea and vomiting.  Endocrine: Negative for polydipsia, polyphagia and polyuria.  Genitourinary:  Negative for dysuria, flank pain, hematuria and urgency.  Musculoskeletal:  Negative for back pain, gait problem, myalgias and neck pain.  Skin:  Negative for  pallor, rash and wound.  Neurological:  Negative for seizures, syncope, weakness, numbness and headaches.  Psychiatric/Behavioral:  Negative for confusion and dysphoric mood.     Objective:       01/24/2022    3:08 PM 01/16/2022    2:30 AM 01/16/2022    2:00 AM  Vitals with BMI  Height '5\' 10"'$     Weight 190 lbs 13 oz    BMI 85.46    Systolic 270 350 093  Diastolic 90 97 96  Pulse 76 85 84    BP (!) 130/90   Pulse 76   Ht '5\' 10"'$  (1.778 m)   Wt 190 lb 12.8 oz (86.5 kg)   BMI 27.38 kg/m   Wt Readings from Last 3 Encounters:  01/24/22 190 lb 12.8 oz (86.5 kg)  01/15/22 187 lb 6.3 oz (85 kg)  11/27/21 189 lb (85.7 kg)    Travis Sharp.     CMP ( most recent) CMP     Component Value Date/Time   NA 138 01/15/2022 2325   NA 133 (L) 11/14/2021 1117   K 3.4 (L) 01/15/2022 2325   CL 107 01/15/2022 2325   CO2 19 (L) 01/15/2022 2325   GLUCOSE 195 (H) 01/15/2022 2325   BUN 15 01/15/2022 2325   BUN 25 11/14/2021 1117   CREATININE 1.03 01/15/2022 2325   CREATININE 0.89 10/26/2015 0758   CALCIUM 9.5 01/15/2022 2325   PROT 8.3 (H) 01/15/2022 2325   PROT 6.9 11/14/2021 1117   ALBUMIN 4.6 01/15/2022 2325   ALBUMIN 4.4 11/14/2021 1117   AST 33 01/15/2022 2325   ALT 23 01/15/2022 2325   ALKPHOS 51 01/15/2022 2325   BILITOT 1.4 (H) 01/15/2022 2325   BILITOT 0.6 11/14/2021 1117   GFRNONAA >60 01/15/2022 2325   GFRAA 70 10/21/2019 0000     Diabetic Labs (most recent): Lab Results  Component Value Date   HGBA1C 12.9 (A) 11/20/2021   HGBA1C 8.4 (A) 05/08/2021   HGBA1C 11.6 11/17/2020   MICROALBUR 0.5 10/26/2015     Lipid Panel ( most recent) Lipid Panel     Component Value Date/Time   CHOL 178 11/14/2021 1117   TRIG 423 (H) 11/14/2021 1117   HDL 31 (L) 11/14/2021 1117   CHOLHDL 5.7 (H) 11/14/2021 1117   CHOLHDL 3.0 10/26/2015 0758   VLDL 16 10/26/2015 0758   LDLCALC 79 11/14/2021 1117   LABVLDL 68 (H) 11/14/2021 1117      Lab Results  Component Value  Date   TSH 1.600 11/14/2021   TSH 1.510 11/08/2020   TSH 1.62 10/21/2019   TSH 1.23 10/26/2015  FREET4 1.20 11/14/2021   FREET4 1.13 11/08/2020   FREET4 1.1 10/26/2015     Assessment & Plan:   1. Uncontrolled type 2 diabetes mellitus with hyperglycemia ([place])  - Travis Sharp has currently uncontrolled symptomatic type 2 DM since  66 years of age.  Patient did get his freestyle libre device.  He is benefiting from this device.  He presents with improved glycemic profile.  90% time range, 10% level 1 hyperglycemia.  0% hypoglycemia.  This is in sharp contrast from A1c of 12.9% during his last visit.     Recent labs reviewed.  - I had a long discussion with him about the progressive nature of diabetes and the pathology behind its complications. -his diabetes is complicated by nonfollow-up and he remains at a high risk for more acute and chronic complications which include CAD, CVA, CKD, retinopathy, and neuropathy. These are all discussed in detail with him.  - I have counseled him on diet  and weight management  by adopting a carbohydrate restricted/protein rich diet. Patient is encouraged to switch to  unprocessed or minimally processed     complex starch and increased protein intake (animal or plant source), fruits, and vegetables. -  he is advised to stick to a routine mealtimes to eat 3 meals  a day and avoid unnecessary snacks ( to snack only to correct hypoglycemia).  - he acknowledges that there is a room for improvement in his food and drink choices. - Suggestion is made for him to avoid simple carbohydrates  from his diet including Cakes, Sweet Desserts, Ice Cream, Soda (diet and regular), Sweet Tea, Candies, Chips, Cookies, Store Bought Juices, Alcohol , Artificial Sweeteners,  Coffee Creamer, and "Sugar-free" Products, Lemonade. This will help patient to have more stable blood glucose profile and potentially avoid unintended weight gain.  The following Lifestyle Medicine  recommendations according to [place]  [facility]) were discussed and and offered to patient and he  agrees to start the journey:  A. Whole Foods, Plant-Based Nutrition comprising of fruits and vegetables, plant-based proteins, whole-grain carbohydrates was discussed in detail with the patient.   A list for source of those nutrients were also provided to the patient.  Patient will use only water or unsweetened tea for hydration. B.  The need to stay away from risky substances including alcohol, smoking; obtaining 7 to 9 hours of restorative sleep, at least 150 minutes of moderate intensity exercise weekly, the importance of healthy social connections,  and stress management techniques were discussed. C.  A full color page of  Calorie density of various food groups per pound showing examples of each food groups was provided to the patient.  - he will be scheduled with Jearld Fenton, RDN, CDE for diabetes education.  - I have approached him with the following individualized plan to manage  his diabetes and patient agrees:  -Patient presents with significantly better engagement.  This resulted in his inability to control his glycemia towards target.  Due to his presentation, he will not need prandial insulin.  He is advised to lower his [place] to 50 units nightly.  He is advised to use his CGM continuously.    - he is encouraged to call clinic for blood glucose levels less than 70 or above 200 mg /dl.  -He will need more work-up to classify his diabetes properly, and the possibility of pancreatic diabetes or LAD in this patient.  Anti-GAD and antiislet antibodies are ordered for him  to do before his next visit.  - Specific targets for  A1c;  LDL, HDL,  and Triglycerides were discussed with the patient.  2) Blood Pressure /Hypertension: His blood pressure is not controlled to target. he is advised to continue his current medications including Benicar 40/25 mg p.o. daily with  breakfast .  3) Lipids/Hyperlipidemia:   Review of his recent lipid panel showed improved LDL at 79.      The above diet will help manage his dyslipidemia as well.   4)  Weight/Diet:  Body mass index is 27.38 kg/m.  -      he is not a candidate for major weight loss.    Exercise, and detailed carbohydrates information provided  -  detailed on discharge instructions.  5) Chronic Care/Health Maintenance:  -he  is on ARB medications and  is encouraged to initiate and continue to follow up with Ophthalmology, Dentist, referred to ophthalmology, podiatrist at least yearly or according to recommendations, and advised to   stay away from smoking. I have recommended yearly flu vaccine and pneumonia vaccine at least every 5 years; moderate intensity exercise for up to 150 minutes weekly; and  sleep for at least 7 hours a day.  - he is  advised to maintain close follow up with Cory Munch, PA-C for primary care needs, as well as his other providers for optimal and coordinated care.   I spent 41 minutes in the care of the patient today including review of labs from [place], Lipids, Thyroid Function, Hematology (current and previous including abstractions from other facilities); face-to-face time discussing  his blood glucose readings/logs, discussing hypoglycemia and hyperglycemia episodes and symptoms, medications doses, his options of short and long term treatment based on the latest standards of care / guidelines;  discussion about incorporating lifestyle medicine;  and documenting the encounter. Risk reduction counseling performed per USPSTF guidelines to reduce  obesity and cardiovascular risk factors.     Please refer to Patient Instructions for Blood Glucose Monitoring and Insulin/Medications Dosing Guide"  in media tab for additional information. Please  also refer to " Patient Self Inventory" in the Media  tab for reviewed elements of pertinent patient history.  Travis Sharp participated in the  discussions, expressed understanding, and voiced agreement with the above plans.  All questions were answered to his satisfaction. he is encouraged to contact clinic should he have any questions or concerns prior to his return visit.   Follow up plan: - Return in about 5 weeks (around 02/28/2022) for Bring Meter/CGM Device/Logs- A1c in Office.  Glade Lloyd, MD [facility] [address] [place], [place] [address] Phone: [phone]  Fax: [phone]    01/24/2022, 5:11 PM  This note was partially dictated with voice recognition software. Similar sounding words can be transcribed inadequately or may not  be corrected upon review.

## 2022-02-08 ENCOUNTER — Ambulatory Visit (INDEPENDENT_AMBULATORY_CARE_PROVIDER_SITE_OTHER): Payer: HMO | Admitting: Podiatry

## 2022-02-08 ENCOUNTER — Encounter: Payer: Self-pay | Admitting: Podiatry

## 2022-02-08 DIAGNOSIS — B351 Tinea unguium: Secondary | ICD-10-CM

## 2022-02-08 DIAGNOSIS — M79675 Pain in left toe(s): Secondary | ICD-10-CM

## 2022-02-08 DIAGNOSIS — M79674 Pain in right toe(s): Secondary | ICD-10-CM

## 2022-02-08 NOTE — Progress Notes (Signed)
  Subjective:  Patient ID: [name], male    DOB: 07-01-1955,  MRN: [id number]  Chief Complaint  Patient presents with   Diabetes   66 y.o. male returns for the above complaint.  Patient presents with thickened elongated dystrophic toenails x10.  Mild pain on palpation and hurts with ambulation is not able to debride out himself he would like for me to do.  He is a diabetic with last A1c of 12.9%.  Objective:  There were no vitals filed for this visit. Podiatric Exam: Vascular: dorsalis pedis and posterior tibial pulses are palpable bilateral. Capillary return is immediate. Temperature gradient is WNL. Skin turgor WNL  Sensorium: Normal Semmes Weinstein monofilament test. Normal tactile sensation bilaterally. Nail Exam: Pt has thick disfigured discolored nails with subungual debris noted bilateral entire nail hallux through fifth toenails.  Pain on palpation to the nails. Ulcer Exam: There is no evidence of ulcer or pre-ulcerative changes or infection. Orthopedic Exam: Muscle tone and strength are WNL. No limitations in general ROM. No crepitus or effusions noted.  Skin: No Porokeratosis. No infection or ulcers    Assessment & Plan:   1. Pain due to onychomycosis of toenails of both feet     Patient was evaluated and treated and all questions answered.  Onychomycosis with pain  -Nails palliatively debrided as below. -Educated on self-care  Procedure: Nail Debridement Rationale: pain  Type of Debridement: manual, sharp debridement. Instrumentation: Nail nipper, rotary burr. Number of Nails: 10  Procedures and Treatment: Consent by patient was obtained for treatment procedures. The patient understood the discussion of treatment and procedures well. All questions were answered thoroughly reviewed. Debridement of mycotic and hypertrophic toenails, 1 through 5 bilateral and clearing of subungual debris. No ulceration, no infection noted.  Return Visit-Office Procedure: Patient  instructed to return to the office for a follow up visit 3 months for continued evaluation and treatment.  Boneta Lucks, DPM    Return in about 3 months (around 05/11/2022) for [facility] .

## 2022-02-14 ENCOUNTER — Telehealth: Payer: Self-pay | Admitting: "Endocrinology

## 2022-02-14 DIAGNOSIS — E1165 Type 2 diabetes mellitus with hyperglycemia: Secondary | ICD-10-CM

## 2022-02-14 MED ORDER — TRESIBA FLEXTOUCH 200 UNIT/ML ~~LOC~~ SOPN: 50.0000 [IU] | PEN_INJECTOR | Freq: Every day | SUBCUTANEOUS | 2 refills | Status: DC

## 2022-02-14 NOTE — Telephone Encounter (Signed)
Patient needs a refill on his [place]. It says fill later. Please send to [facility] in [place]

## 2022-02-14 NOTE — Telephone Encounter (Signed)
Rx sent 

## 2022-02-28 ENCOUNTER — Encounter: Payer: Self-pay | Admitting: "Endocrinology

## 2022-02-28 ENCOUNTER — Ambulatory Visit (INDEPENDENT_AMBULATORY_CARE_PROVIDER_SITE_OTHER): Payer: HMO | Admitting: "Endocrinology

## 2022-02-28 VITALS — BP 122/84 | HR 76 | Ht 70.0 in | Wt 192.8 lb

## 2022-02-28 DIAGNOSIS — E782 Mixed hyperlipidemia: Secondary | ICD-10-CM | POA: Diagnosis not present

## 2022-02-28 DIAGNOSIS — E559 Vitamin D deficiency, unspecified: Secondary | ICD-10-CM

## 2022-02-28 DIAGNOSIS — Z7984 Long term (current) use of oral hypoglycemic drugs: Secondary | ICD-10-CM | POA: Diagnosis not present

## 2022-02-28 DIAGNOSIS — I10 Essential (primary) hypertension: Secondary | ICD-10-CM

## 2022-02-28 DIAGNOSIS — E1165 Type 2 diabetes mellitus with hyperglycemia: Secondary | ICD-10-CM

## 2022-02-28 LAB — POCT GLYCOSYLATED HEMOGLOBIN (HGB A1C): HbA1c, POC (controlled diabetic range): 7 % (ref 0.0–7.0)

## 2022-02-28 NOTE — Progress Notes (Signed)
02/28/2022, 4:36 PM   Endocrinology follow-up note  Subjective:    Patient ID: Travis Sharp, male    DOB: 02/10/1956.  Travis Sharp is being seen in follow-up after he was seen in consultation for management of currently uncontrolled symptomatic diabetes requested by  Cory Munch, PA-C.   Past Medical History:  Diagnosis Date   Colitis 03/23/2015   Diabetes mellitus    Gastritis    Hypertension     Past Surgical History:  Procedure Laterality Date   BIOPSY  05/10/2015   Procedure: BIOPSY;  Surgeon: Danie Binder, MD;  [facility]: [facility];  Service: Endoscopy;;  gastric bx's   COLONOSCOPY WITH PROPOFOL N/A 05/10/2015   dr. Oneida Alar: Mild diverticulosis, hemorrhoids.  Next colonoscopy 10 years   ESOPHAGOGASTRODUODENOSCOPY (EGD) WITH PROPOFOL N/A 05/10/2015   Dr. Oneida Alar: dysphagai due to stricture at [facility] s/p dilation, mild nonerosive gastritis.    SAVORY DILATION N/A 05/10/2015   Procedure: SAVORY DILATION;  Surgeon: Danie Binder, MD;  Location: [facility];  Service: Endoscopy;  Laterality: N/A;   TONSILLECTOMY      Social History   Socioeconomic History   Marital status: Married    Spouse name: Olin Hauser   Number of children: 3   Years of education: Not on file   Highest education level: Not on file  Occupational History   Occupation: [employer]  Tobacco Use   Smoking status: Former    Packs/day: 1.00    Years: 28.00    Total pack years: 28.00    Types: Cigarettes    Quit date: 05/04/2005    Years since quitting: 16.8   Smokeless tobacco: Never  Substance and Sexual Activity   Alcohol use: Not Currently    Alcohol/week: 0.0 standard drinks of alcohol    Comment: very seldom   Drug use: Not Currently    Types: Marijuana    Comment: denied 10/28/19   Sexual activity: Not on file  Other Topics Concern   Not on file  Social History Narrative   Not on file   Social Determinants of Health   Financial Resource Strain: Low  Risk  (10/21/2019)   Overall Financial Resource Strain (CARDIA)    Difficulty of Paying Living Expenses: Not hard at all  Food Insecurity: No Food Insecurity (10/21/2019)   Hunger Vital Sign    Worried About Running Out of Food in the Last Year: Never true    Ran Out of Food in the Last Year: Never true  Transportation Needs: No Transportation Needs (10/21/2019)   PRAPARE - Hydrologist (Medical): No    Lack of Transportation (Non-Medical): No  Physical Activity: Insufficiently Active (10/21/2019)   Exercise Vital Sign    Days of Exercise per Week: 3 days    Minutes of Exercise per Session: 30 min  Stress: No Stress Concern Present (10/21/2019)   [place]    Feeling of Stress : Only a little  Social Connections: Moderately Integrated (10/21/2019)   Social Connection and Isolation Panel [NHANES]    Frequency of Communication with Friends and Family: More than three times a week    Frequency of Social Gatherings with Friends and Family: More than three times a week    Attends Religious Services: 1 to 4 times per year    Active Member of [employer] or Organizations: No    Attends Club or  Organization Meetings: Never    Marital Status: Married    Family History  Problem Relation Age of Onset   Colon cancer Other        possibly 2 uncles   Stroke Mother    Aneurysm Mother    Hypertension Mother    Heart attack Father    Hypertension Sister    Hypertension Brother    Hypertension Daughter     Outpatient Encounter Medications as of 02/28/2022  Medication Sig   albuterol (VENTOLIN HFA) 108 (90 Base) MCG/ACT inhaler    aspirin 81 MG tablet Take 81 mg by mouth daily.   celecoxib (CELEBREX) 200 MG capsule Take by mouth.   Continuous Blood Gluc Receiver (FREESTYLE LIBRE 2 READER) DEVI Use to check glucose four times daily as instructed   Continuous Blood Gluc Sensor (FREESTYLE LIBRE 2 SENSOR) MISC Use to  check glucose four times daily as instructed. Change sensor every 14 days   [facility] D3 125 MCG (5000 UT) capsule TAKE 1 CAPSULE BY MOUTH EVERY DAY   glucose blood (ACCU-CHEK GUIDE) test strip Use as instructed   insulin degludec (TRESIBA FLEXTOUCH) 200 UNIT/ML FlexTouch Pen Inject 50 Units into the skin at bedtime.   olmesartan-hydrochlorothiazide (BENICAR HCT) 40-25 MG tablet Take 1 tablet by mouth daily.   ondansetron (ZOFRAN-ODT) 4 MG disintegrating tablet Take 1 tablet (4 mg total) by mouth every 8 (eight) hours as needed for nausea or vomiting.   [DISCONTINUED] insulin aspart (NOVOLOG) 100 UNIT/ML FlexPen Inject 1 Units into the skin 3 (three) times daily with meals. (Patient not taking: Reported on 05/08/2021)   No facility-administered encounter medications on file as of 02/28/2022.    ALLERGIES: No Known Allergies  VACCINATION STATUS: Immunization History  Administered Date(s) Administered   Influenza,inj,Quad PF,6+ Mos 03/24/2015   Moderna Sars-Covid-2 Vaccination 06/16/2019, 07/14/2019   Pneumococcal Polysaccharide-23 03/24/2015    Diabetes He presents for his follow-up diabetic visit. He has type 2 diabetes mellitus. Onset time: He was diagnosed at approximate age of 63 years. His disease course has been improving. There are no hypoglycemic associated symptoms. Pertinent negatives for hypoglycemia include no confusion, headaches, pallor or seizures. Pertinent negatives for diabetes include no blurred vision, no chest pain, no fatigue, no polydipsia, no polyphagia, no polyuria and no weakness. There are no hypoglycemic complications. Symptoms are improving. There are no diabetic complications. Risk factors for coronary artery disease include diabetes mellitus, hypertension, family history, male sex and tobacco exposure. Current diabetic treatment includes insulin injections. His weight is fluctuating minimally. He is following a generally unhealthy diet. When asked about meal planning, he  reported none. He has not had a previous visit with a dietitian. He rarely participates in exercise. His home blood glucose trend is decreasing steadily. His breakfast blood glucose range is generally 140-180 mg/dl. His lunch blood glucose range is generally 140-180 mg/dl. His dinner blood glucose range is generally 140-180 mg/dl. His bedtime blood glucose range is generally 140-180 mg/dl. His overall blood glucose range is 140-180 mg/dl. (He presents with significant improvement in his glycemic profile.  His freestyle libre device AGP shows 80% time in range, 20% level 1 hyperglycemia.  No hypoglycemia.  His point-of-care A1c is 7% improving from 12.9%.   ) An ACE inhibitor/angiotensin II receptor blocker is being taken. Eye exam is not current.  Hypertension This is a chronic problem. The current episode started more than 1 year ago. Pertinent negatives include no blurred vision, chest pain, headaches, neck pain, palpitations or shortness of  breath. Risk factors for coronary artery disease include diabetes mellitus, male gender, smoking/tobacco exposure and family history. Past treatments include angiotensin blockers.     Review of Systems  Constitutional:  Negative for chills, fatigue, fever and unexpected weight change.  HENT:  Negative for dental problem, mouth sores and trouble swallowing.   Eyes:  Negative for blurred vision and visual disturbance.  Respiratory:  Negative for cough, choking, chest tightness, shortness of breath and wheezing.   Cardiovascular:  Negative for chest pain, palpitations and leg swelling.  Gastrointestinal:  Negative for abdominal distention, abdominal pain, constipation, diarrhea, nausea and vomiting.  Endocrine: Negative for polydipsia, polyphagia and polyuria.  Genitourinary:  Negative for dysuria, flank pain, hematuria and urgency.  Musculoskeletal:  Negative for back pain, gait problem, myalgias and neck pain.  Skin:  Negative for pallor, rash and wound.   Neurological:  Negative for seizures, syncope, weakness, numbness and headaches.  Psychiatric/Behavioral:  Negative for confusion and dysphoric mood.     Objective:       02/28/2022    3:21 PM 01/24/2022    3:08 PM 01/16/2022    2:30 AM  Vitals with BMI  Height '5\' 10"'$  '5\' 10"'$    Weight 192 lbs 13 oz 190 lbs 13 oz   BMI 93.81 82.99   Systolic 371 696 789  Diastolic 84 90 97  Pulse 76 76 85    BP 122/84   Pulse 76   Ht '5\' 10"'$  (1.778 m)   Wt 192 lb 12.8 oz (87.5 kg)   BMI 27.66 kg/m   Wt Readings from Last 3 Encounters:  02/28/22 192 lb 12.8 oz (87.5 kg)  01/24/22 190 lb 12.8 oz (86.5 kg)  01/15/22 187 lb 6.3 oz (85 kg)    Hesitant affect.     CMP ( most recent) CMP     Component Value Date/Time   NA 138 01/15/2022 2325   NA 133 (L) 11/14/2021 1117   K 3.4 (L) 01/15/2022 2325   CL 107 01/15/2022 2325   CO2 19 (L) 01/15/2022 2325   GLUCOSE 195 (H) 01/15/2022 2325   BUN 15 01/15/2022 2325   BUN 25 11/14/2021 1117   CREATININE 1.03 01/15/2022 2325   CREATININE 0.89 10/26/2015 0758   CALCIUM 9.5 01/15/2022 2325   PROT 8.3 (H) 01/15/2022 2325   PROT 6.9 11/14/2021 1117   ALBUMIN 4.6 01/15/2022 2325   ALBUMIN 4.4 11/14/2021 1117   AST 33 01/15/2022 2325   ALT 23 01/15/2022 2325   ALKPHOS 51 01/15/2022 2325   BILITOT 1.4 (H) 01/15/2022 2325   BILITOT 0.6 11/14/2021 1117   GFRNONAA >60 01/15/2022 2325   GFRAA 70 10/21/2019 0000     Diabetic Labs (most recent): Lab Results  Component Value Date   HGBA1C 7.0 02/28/2022   HGBA1C 12.9 (A) 11/20/2021   HGBA1C 8.4 (A) 05/08/2021   MICROALBUR 0.5 10/26/2015     Lipid Panel ( most recent) Lipid Panel     Component Value Date/Time   CHOL 178 11/14/2021 1117   TRIG 423 (H) 11/14/2021 1117   HDL 31 (L) 11/14/2021 1117   CHOLHDL 5.7 (H) 11/14/2021 1117   CHOLHDL 3.0 10/26/2015 0758   VLDL 16 10/26/2015 0758   LDLCALC 79 11/14/2021 1117   LABVLDL 68 (H) 11/14/2021 1117      Lab Results  Component Value  Date   TSH 1.600 11/14/2021   TSH 1.510 11/08/2020   TSH 1.62 10/21/2019   TSH 1.23 10/26/2015   FREET4 1.20  11/14/2021   FREET4 1.13 11/08/2020   FREET4 1.1 10/26/2015     Assessment & Plan:   1. Uncontrolled type 2 diabetes mellitus with hyperglycemia ([place])  - [place] has currently uncontrolled symptomatic type 2 DM since  66 years of age.  He presents with significant improvement in his glycemic profile.  His freestyle libre device AGP shows 80% time in range, 20% level 1 hyperglycemia.  No hypoglycemia.  His point-of-care A1c is 7% improving from 12.9%.   Recent labs reviewed.  - I had a long discussion with him about the progressive nature of diabetes and the pathology behind its complications. -his diabetes is complicated by nonfollow-up and he remains at a high risk for more acute and chronic complications which include CAD, CVA, CKD, retinopathy, and neuropathy. These are all discussed in detail with him.  - I have counseled him on diet  and weight management  by adopting a carbohydrate restricted/protein rich diet. Patient is encouraged to switch to  unprocessed or minimally processed     complex starch and increased protein intake (animal or plant source), fruits, and vegetables. -  he is advised to stick to a routine mealtimes to eat 3 meals  a day and avoid unnecessary snacks ( to snack only to correct hypoglycemia).   This patient is benefiting from lifestyle medicine.  - he acknowledges that there is a room for improvement in his food and drink choices. - Suggestion is made for him to avoid simple carbohydrates  from his diet including Cakes, Sweet Desserts, Ice Cream, Soda (diet and regular), Sweet Tea, Candies, Chips, Cookies, Store Bought Juices, Alcohol , Artificial Sweeteners,  Coffee Creamer, and "Sugar-free" Products, Lemonade. This will help patient to have more stable blood glucose profile and potentially avoid unintended weight gain.  The following  Lifestyle Medicine recommendations according to [place]  [facility]) were discussed and and offered to patient and he  agrees to start the journey:  A. Whole Foods, Plant-Based Nutrition comprising of fruits and vegetables, plant-based proteins, whole-grain carbohydrates was discussed in detail with the patient.   A list for source of those nutrients were also provided to the patient.  Patient will use only water or unsweetened tea for hydration. B.  The need to stay away from risky substances including alcohol, smoking; obtaining 7 to 9 hours of restorative sleep, at least 150 minutes of moderate intensity exercise weekly, the importance of healthy social connections,  and stress management techniques were discussed. C.  A full color page of  Calorie density of various food groups per pound showing examples of each food groups was provided to the patient.   - he will be scheduled with Jearld Fenton, RDN, CDE for diabetes education.  - I have approached him with the following individualized plan to manage  his diabetes and patient agrees:  -Patient presents with dramatic improvement in his glycemic profile after his engagement with lifestyle meds.  She will not need prandial insulin for now.  She is advised to continue Tresiba 50 units nightly associated with continuous utility of his CGM device.   - he is encouraged to call clinic for blood glucose levels less than 70 or above 200 mg /dl. He does not tolerate metformin.  He will not need GLP-1 receptor agonist for now. -He will need more work-up to classify his diabetes properly, and the possibility of pancreatic diabetes or LAD in this patient.  Anti-GAD and antiislet antibodies are ordered for  him to do before his next visit.  - Specific targets for  A1c;  LDL, HDL,  and Triglycerides were discussed with the patient.  2) Blood Pressure /Hypertension:  His blood pressure is controlled to target.   he is advised to  continue his current medications including Benicar 40/25 mg p.o. daily with breakfast .  3) Lipids/Hyperlipidemia:   Review of his recent lipid panel showed improved LDL at 79.      The above diet will help manage his dyslipidemia as well.  He will be considered for fasting lipid panel before his next visit.   4)  Weight/Diet:  Body mass index is 27.66 kg/m.  -      he is not a candidate for major weight loss.    Exercise, and detailed carbohydrates information provided  -  detailed on discharge instructions.  5) Chronic Care/Health Maintenance:  -he  is on ARB medications and  is encouraged to initiate and continue to follow up with Ophthalmology, Dentist, referred to ophthalmology, podiatrist at least yearly or according to recommendations, and advised to   stay away from smoking. I have recommended yearly flu vaccine and pneumonia vaccine at least every 5 years; moderate intensity exercise for up to 150 minutes weekly; and  sleep for at least 7 hours a day.  - he is  advised to maintain close follow up with Cory Munch, PA-C for primary care needs, as well as his other providers for optimal and coordinated care.   I spent 31 minutes in the care of the patient today including review of labs from [place], Lipids, Thyroid Function, Hematology (current and previous including abstractions from other facilities); face-to-face time discussing  his blood glucose readings/logs, discussing hypoglycemia and hyperglycemia episodes and symptoms, medications doses, his options of short and long term treatment based on the latest standards of care / guidelines;  discussion about incorporating lifestyle medicine;  and documenting the encounter. Risk reduction counseling performed per USPSTF guidelines to reduce cardiovascular risk factors.     Please refer to Patient Instructions for Blood Glucose Monitoring and Insulin/Medications Dosing Guide"  in media tab for additional information. Please  also refer to "  Patient Self Inventory" in the Media  tab for reviewed elements of pertinent patient history.  Travis Sharp participated in the discussions, expressed understanding, and voiced agreement with the above plans.  All questions were answered to his satisfaction. he is encouraged to contact clinic should he have any questions or concerns prior to his return visit.    Follow up plan: - Return in about 4 months (around 06/29/2022) for F/U with Pre-visit Labs, Meter/CGM/Logs, A1c here.  Glade Lloyd, MD [facility] [address] [place], [place] [address] Phone: [phone]  Fax: [phone]    02/28/2022, 4:36 PM  This note was partially dictated with voice recognition software. Similar sounding words can be transcribed inadequately or may not  be corrected upon review.

## 2022-02-28 NOTE — Patient Instructions (Signed)

## 2022-03-14 DIAGNOSIS — S335XXA Sprain of ligaments of lumbar spine, initial encounter: Secondary | ICD-10-CM | POA: Diagnosis not present

## 2022-03-14 DIAGNOSIS — Z6828 Body mass index (BMI) 28.0-28.9, adult: Secondary | ICD-10-CM | POA: Diagnosis not present

## 2022-03-14 DIAGNOSIS — M549 Dorsalgia, unspecified: Secondary | ICD-10-CM | POA: Diagnosis not present

## 2022-03-14 DIAGNOSIS — E6609 Other obesity due to excess calories: Secondary | ICD-10-CM | POA: Diagnosis not present

## 2022-04-15 ENCOUNTER — Other Ambulatory Visit: Payer: Self-pay | Admitting: "Endocrinology

## 2022-04-15 DIAGNOSIS — E1165 Type 2 diabetes mellitus with hyperglycemia: Secondary | ICD-10-CM

## 2022-05-03 ENCOUNTER — Encounter: Payer: Self-pay | Admitting: Podiatry

## 2022-05-03 ENCOUNTER — Ambulatory Visit (INDEPENDENT_AMBULATORY_CARE_PROVIDER_SITE_OTHER): Payer: PPO | Admitting: Podiatry

## 2022-05-03 VITALS — BP 172/91 | HR 81

## 2022-05-03 DIAGNOSIS — E1159 Type 2 diabetes mellitus with other circulatory complications: Secondary | ICD-10-CM

## 2022-05-03 DIAGNOSIS — B351 Tinea unguium: Secondary | ICD-10-CM

## 2022-05-03 DIAGNOSIS — M79675 Pain in left toe(s): Secondary | ICD-10-CM

## 2022-05-03 DIAGNOSIS — M79674 Pain in right toe(s): Secondary | ICD-10-CM

## 2022-05-03 NOTE — Progress Notes (Signed)
This patient returns to my office for at risk foot care.  This patient requires this care by a professional since this patient will be at risk due to having  diabetes.  This patient is unable to cut nails himself since the patient cannot reach his nails.These nails are painful walking and wearing shoes.  This patient presents for at risk foot care today.  General Appearance  Alert, conversant and in no acute stress.  Vascular  Dorsalis pedis and posterior tibial  pulses are palpable  bilaterally.  Capillary return is within normal limits  bilaterally. Temperature is within normal limits  bilaterally.  Neurologic  Senn-Weinstein monofilament wire test within normal limits  bilaterally. Muscle power within normal limits bilaterally.  Nails Thick disfigured discolored nails with subungual debris  from hallux to fifth toes bilaterally. No evidence of bacterial infection or drainage bilaterally.  Orthopedic  No limitations of motion  feet .  No crepitus or effusions noted.  No bony pathology or digital deformities noted.  Skin  normotropic skin with no porokeratosis noted bilaterally.  No signs of infections or ulcers noted.     Onychomycosis  Pain in right toes  Pain in left toes  Consent was obtained for treatment procedures.   Mechanical debridement of nails 1-5  bilaterally performed with a nail nipper.  Filed with dremel without incident.    Return office visit   3 months                   Told patient to return for periodic foot care and evaluation due to potential at risk complications.   Aman Batley DPM   

## 2022-05-14 ENCOUNTER — Ambulatory Visit: Payer: BC Managed Care – PPO | Admitting: Podiatry

## 2022-07-02 ENCOUNTER — Ambulatory Visit: Payer: HMO | Admitting: "Endocrinology

## 2022-07-24 ENCOUNTER — Other Ambulatory Visit: Payer: Self-pay | Admitting: "Endocrinology

## 2022-07-24 DIAGNOSIS — E1165 Type 2 diabetes mellitus with hyperglycemia: Secondary | ICD-10-CM

## 2022-07-25 ENCOUNTER — Other Ambulatory Visit: Payer: Self-pay | Admitting: "Endocrinology

## 2022-07-25 DIAGNOSIS — E1165 Type 2 diabetes mellitus with hyperglycemia: Secondary | ICD-10-CM

## 2022-07-25 MED ORDER — FREESTYLE LIBRE 2 SENSOR MISC: 0 refills | Status: DC

## 2022-08-02 ENCOUNTER — Ambulatory Visit: Payer: PPO | Admitting: Podiatry

## 2022-08-07 LAB — LIPID PANEL
Chol/HDL Ratio: 4.4 ratio (ref 0.0–5.0)
Cholesterol, Total: 169 mg/dL (ref 100–199)
HDL: 38 mg/dL — ABNORMAL LOW (ref >39)
LDL Chol Calc (NIH): 105 mg/dL — ABNORMAL HIGH (ref 0–99)
Triglycerides: 147 mg/dL (ref 0–149)
VLDL Cholesterol Cal: 26 mg/dL (ref 5–40)

## 2022-08-07 LAB — ANTI-ISLET CELL ANTIBODY: Islet Cell Ab: NEGATIVE

## 2022-08-07 LAB — GLUTAMIC ACID DECARBOXYLASE AUTO ABS: Glutamic Acid Decarb Ab: <5 U/mL (ref 0.0–5.0)

## 2022-08-08 ENCOUNTER — Ambulatory Visit (INDEPENDENT_AMBULATORY_CARE_PROVIDER_SITE_OTHER): Payer: PPO | Admitting: "Endocrinology

## 2022-08-08 ENCOUNTER — Encounter: Payer: Self-pay | Admitting: "Endocrinology

## 2022-08-08 VITALS — BP 138/86 | HR 80 | Ht 70.0 in | Wt 201.8 lb

## 2022-08-08 DIAGNOSIS — E782 Mixed hyperlipidemia: Secondary | ICD-10-CM

## 2022-08-08 DIAGNOSIS — E559 Vitamin D deficiency, unspecified: Secondary | ICD-10-CM | POA: Diagnosis not present

## 2022-08-08 DIAGNOSIS — E1165 Type 2 diabetes mellitus with hyperglycemia: Secondary | ICD-10-CM | POA: Diagnosis not present

## 2022-08-08 DIAGNOSIS — I10 Essential (primary) hypertension: Secondary | ICD-10-CM | POA: Diagnosis not present

## 2022-08-08 DIAGNOSIS — Z794 Long term (current) use of insulin: Secondary | ICD-10-CM

## 2022-08-08 LAB — POCT GLYCOSYLATED HEMOGLOBIN (HGB A1C): HbA1c, POC (controlled diabetic range): 7.8 % — AB (ref 0.0–7.0)

## 2022-08-08 MED ORDER — TRESIBA FLEXTOUCH 200 UNIT/ML ~~LOC~~ SOPN: 60.0000 [IU] | PEN_INJECTOR | Freq: Every day | SUBCUTANEOUS | 2 refills | Status: DC

## 2022-08-08 MED ORDER — FREESTYLE LIBRE 2 SENSOR MISC: 3 refills | Status: DC

## 2022-08-08 NOTE — Patient Instructions (Signed)

## 2022-08-08 NOTE — Progress Notes (Signed)
08/08/2022, 11:13 AM   Endocrinology follow-up note  Subjective:    Patient ID: Travis Sharp, male    DOB: Sep 21, 1955.  Travis Sharp is being seen in follow-up after he was seen in consultation for management of currently uncontrolled symptomatic diabetes requested by  Shawnie Dapper, PA-C.   Past Medical History:  Diagnosis Date   Colitis 03/23/2015   Diabetes mellitus    Gastritis    Hypertension     Past Surgical History:  Procedure Laterality Date   BIOPSY  05/10/2015   Procedure: BIOPSY;  Surgeon: West Bali, MD;  Location: [facility];  Service: Endoscopy;;  gastric bx's   COLONOSCOPY WITH PROPOFOL N/A 05/10/2015   dr. Darrick Penna: Mild diverticulosis, hemorrhoids.  Next colonoscopy 10 years   ESOPHAGOGASTRODUODENOSCOPY (EGD) WITH PROPOFOL N/A 05/10/2015   Dr. Darrick Penna: dysphagai due to stricture at [facility] s/p dilation, mild nonerosive gastritis.    SAVORY DILATION N/A 05/10/2015   Procedure: SAVORY DILATION;  Surgeon: West Bali, MD;  Location: [facility];  Service: Endoscopy;  Laterality: N/A;   TONSILLECTOMY      Social History   Socioeconomic History   Marital status: Married    Spouse name: Rinaldo Cloud   Number of children: 3   Years of education: Not on file   Highest education level: Not on file  Occupational History   Occupation: [employer]  Tobacco Use   Smoking status: Former    Packs/day: 1.00    Years: 28.00    Additional pack years: 0.00    Total pack years: 28.00    Types: Cigarettes    Quit date: 05/04/2005    Years since quitting: 17.2   Smokeless tobacco: Never  Substance and Sexual Activity   Alcohol use: Not Currently    Alcohol/week: 0.0 standard drinks of alcohol    Comment: very seldom   Drug use: Not Currently    Types: Marijuana    Comment: denied 10/28/19   Sexual activity: Not on file  Other Topics Concern   Not on file  Social History Narrative   Not on file   Social Determinants of Health    Financial Resource Strain: Low Risk  (10/21/2019)   Overall Financial Resource Strain (CARDIA)    Difficulty of Paying Living Expenses: Not hard at all  Food Insecurity: No Food Insecurity (10/21/2019)   Hunger Vital Sign    Worried About Running Out of Food in the Last Year: Never true    Ran Out of Food in the Last Year: Never true  Transportation Needs: No Transportation Needs (10/21/2019)   PRAPARE - Administrator, Civil Service (Medical): No    Lack of Transportation (Non-Medical): No  Physical Activity: Insufficiently Active (10/21/2019)   Exercise Vital Sign    Days of Exercise per Week: 3 days    Minutes of Exercise per Session: 30 min  Stress: No Stress Concern Present (10/21/2019)   [employer] of Occupational Health - Occupational Stress Questionnaire    Feeling of Stress : Only a little  Social Connections: Moderately Integrated (10/21/2019)   Social Connection and Isolation Panel [NHANES]    Frequency of Communication with Friends and Family: More than three times a week    Frequency of Social Gatherings with Friends and Family: More than three times a week    Attends Religious Services: 1 to 4 times per year    Active Member of [employer] or Organizations:  No    Attends Banker Meetings: Never    Marital Status: Married    Family History  Problem Relation Age of Onset   Colon cancer Other        possibly 2 uncles   Stroke Mother    Aneurysm Mother    Hypertension Mother    Heart attack Father    Hypertension Sister    Hypertension Brother    Hypertension Daughter     Outpatient Encounter Medications as of 08/08/2022  Medication Sig   albuterol (VENTOLIN HFA) 108 (90 Base) MCG/ACT inhaler    aspirin 81 MG tablet Take 81 mg by mouth daily.   celecoxib (CELEBREX) 200 MG capsule Take by mouth.   Continuous Blood Gluc Receiver (FREESTYLE LIBRE 2 READER) DEVI Use to check glucose four times daily as instructed   Continuous Glucose Sensor  (FREESTYLE LIBRE 2 SENSOR) MISC Patient needs to keep follow up for refills.   [employer] D3 125 MCG (5000 UT) capsule TAKE 1 CAPSULE BY MOUTH EVERY DAY   glucose blood (ACCU-CHEK GUIDE) test strip Use as instructed   insulin degludec (TRESIBA FLEXTOUCH) 200 UNIT/ML FlexTouch Pen Inject 60 Units into the skin at bedtime.   olmesartan-hydrochlorothiazide (BENICAR HCT) 40-25 MG tablet Take 1 tablet by mouth daily.   ondansetron (ZOFRAN-ODT) 4 MG disintegrating tablet Take 1 tablet (4 mg total) by mouth every 8 (eight) hours as needed for nausea or vomiting.   [DISCONTINUED] Continuous Blood Gluc Sensor (FREESTYLE LIBRE 2 SENSOR) MISC Patient needs to keep follow up for refills.   [DISCONTINUED] insulin degludec (TRESIBA FLEXTOUCH) 200 UNIT/ML FlexTouch Pen Inject 50 Units into the skin at bedtime.   No facility-administered encounter medications on file as of 08/08/2022.    ALLERGIES: No Known Allergies  VACCINATION STATUS: Immunization History  Administered Date(s) Administered   Influenza,inj,Quad PF,6+ Mos 03/24/2015   Moderna Sars-Covid-2 Vaccination 06/16/2019, 07/14/2019   Pneumococcal Polysaccharide-23 03/24/2015    Diabetes He presents for his follow-up diabetic visit. He has type 2 diabetes mellitus. Onset time: He was diagnosed at approximate age of 49 years. His disease course has been improving. There are no hypoglycemic associated symptoms. Pertinent negatives for hypoglycemia include no confusion, headaches, pallor or seizures. Pertinent negatives for diabetes include no blurred vision, no chest pain, no fatigue, no polydipsia, no polyphagia, no polyuria and no weakness. There are no hypoglycemic complications. Symptoms are improving. There are no diabetic complications. Risk factors for coronary artery disease include diabetes mellitus, hypertension, family history, male sex, tobacco exposure and dyslipidemia. Current diabetic treatment includes insulin injections. His weight is  increasing steadily. He is following a generally unhealthy diet. When asked about meal planning, he reported none. He has not had a previous visit with a dietitian. He rarely participates in exercise. His home blood glucose trend is decreasing steadily. His breakfast blood glucose range is generally 180-200 mg/dl. His lunch blood glucose range is generally 180-200 mg/dl. His dinner blood glucose range is generally 180-200 mg/dl. His bedtime blood glucose range is generally 180-200 mg/dl. His overall blood glucose range is 180-200 mg/dl. (He presents with above target glycemic profile.  He is CGM AGP report shows 36% time in range, 55% Level One hyperglycemia, 9% limited to hyperglycemia.  No hypoglycemia.  His point-of-care A1c is 7.8% increasing from 7% during his last visit.  This is however improving from 12.9% A1c during last year.      ) An ACE inhibitor/angiotensin II receptor blocker is being taken. Eye exam is  not current.  Hypertension This is a chronic problem. The current episode started more than 1 year ago. Pertinent negatives include no blurred vision, chest pain, headaches, neck pain, palpitations or shortness of breath. Risk factors for coronary artery disease include diabetes mellitus, male gender, smoking/tobacco exposure and family history. Past treatments include angiotensin blockers.     Review of Systems  Constitutional:  Negative for chills, fatigue, fever and unexpected weight change.  HENT:  Negative for dental problem, mouth sores and trouble swallowing.   Eyes:  Negative for blurred vision and visual disturbance.  Respiratory:  Negative for cough, choking, chest tightness, shortness of breath and wheezing.   Cardiovascular:  Negative for chest pain, palpitations and leg swelling.  Gastrointestinal:  Negative for abdominal distention, abdominal pain, constipation, diarrhea, nausea and vomiting.  Endocrine: Negative for polydipsia, polyphagia and polyuria.  Genitourinary:   Negative for dysuria, flank pain, hematuria and urgency.  Musculoskeletal:  Negative for back pain, gait problem, myalgias and neck pain.  Skin:  Negative for pallor, rash and wound.  Neurological:  Negative for seizures, syncope, weakness, numbness and headaches.  Psychiatric/Behavioral:  Negative for confusion and dysphoric mood.     Objective:       08/08/2022   10:03 AM 08/08/2022    9:50 AM 05/03/2022    2:35 PM  Vitals with BMI  Height  5\' 10"    Weight  201 lbs 13 oz   BMI  28.96   Systolic 138 144 161  Diastolic 86 88 91  Pulse  80 81    BP 138/86 Comment: L arm with manuel cuff  Pulse 80   Ht 5\' 10"  (1.778 m)   Wt 201 lb 12.8 oz (91.5 kg)   BMI 28.96 kg/m   Wt Readings from Last 3 Encounters:  08/08/22 201 lb 12.8 oz (91.5 kg)  02/28/22 192 lb 12.8 oz (87.5 kg)  01/24/22 190 lb 12.8 oz (86.5 kg)    Hesitant affect.     CMP ( most recent) CMP     Component Value Date/Time   NA 138 01/15/2022 2325   NA 133 (L) 11/14/2021 1117   K 3.4 (L) 01/15/2022 2325   CL 107 01/15/2022 2325   CO2 19 (L) 01/15/2022 2325   GLUCOSE 195 (H) 01/15/2022 2325   BUN 15 01/15/2022 2325   BUN 25 11/14/2021 1117   CREATININE 1.03 01/15/2022 2325   CREATININE 0.89 10/26/2015 0758   CALCIUM 9.5 01/15/2022 2325   PROT 8.3 (H) 01/15/2022 2325   PROT 6.9 11/14/2021 1117   ALBUMIN 4.6 01/15/2022 2325   ALBUMIN 4.4 11/14/2021 1117   AST 33 01/15/2022 2325   ALT 23 01/15/2022 2325   ALKPHOS 51 01/15/2022 2325   BILITOT 1.4 (H) 01/15/2022 2325   BILITOT 0.6 11/14/2021 1117   GFRNONAA >60 01/15/2022 2325   GFRAA 70 10/21/2019 0000     Diabetic Labs (most recent): Lab Results  Component Value Date   HGBA1C 7.8 (A) 08/08/2022   HGBA1C 7.0 02/28/2022   HGBA1C 12.9 (A) 11/20/2021   MICROALBUR 0.5 10/26/2015     Lipid Panel ( most recent) Lipid Panel     Component Value Date/Time   CHOL 169 08/02/2022 1332   TRIG 147 08/02/2022 1332   HDL 38 (L) 08/02/2022 1332    CHOLHDL 4.4 08/02/2022 1332   CHOLHDL 3.0 10/26/2015 0758   VLDL 16 10/26/2015 0758   LDLCALC 105 (H) 08/02/2022 1332   LABVLDL 26 08/02/2022 1332  Lab Results  Component Value Date   TSH 1.600 11/14/2021   TSH 1.510 11/08/2020   TSH 1.62 10/21/2019   TSH 1.23 10/26/2015   FREET4 1.20 11/14/2021   FREET4 1.13 11/08/2020   FREET4 1.1 10/26/2015     Assessment & Plan:   1. Uncontrolled type 2 diabetes mellitus with hyperglycemia (HCC)  - Travis Sharp has currently uncontrolled symptomatic type 2 DM since  67 years of age.  He presents with above target glycemic profile.  He is CGM AGP report shows 36% time in range, 55% Level One hyperglycemia, 9% limited to hyperglycemia.  No hypoglycemia.  His point-of-care A1c is 7.8% increasing from 7% during his last visit.  This is however improving from 12.9% A1c during last year.     Recent labs reviewed.  - I had a long discussion with him about the progressive nature of diabetes and the pathology behind its complications. -his diabetes is complicated by nonfollow-up and he remains at a high risk for more acute and chronic complications which include CAD, CVA, CKD, retinopathy, and neuropathy. These are all discussed in detail with him.  - I have counseled him on diet  and weight management  by adopting a carbohydrate restricted/protein rich diet. Patient is encouraged to switch to  unprocessed or minimally processed     complex starch and increased protein intake (animal or plant source), fruits, and vegetables. -  he is advised to stick to a routine mealtimes to eat 3 meals  a day and avoid unnecessary snacks ( to snack only to correct hypoglycemia).   This patient is benefiting from lifestyle medicine.  - he acknowledges that there is a room for improvement in his food and drink choices. - Suggestion is made for him to avoid simple carbohydrates  from his diet including Cakes, Sweet Desserts, Ice Cream, Soda (diet and regular),  Sweet Tea, Candies, Chips, Cookies, Store Bought Juices, Alcohol , Artificial Sweeteners,  Coffee Creamer, and "Sugar-free" Products, Lemonade. This will help patient to have more stable blood glucose profile and potentially avoid unintended weight gain.  The following Lifestyle Medicine recommendations according to American College of Lifestyle Medicine  [facility]) were discussed and and offered to patient and he  agrees to start the journey:  A. Whole Foods, Plant-Based Nutrition comprising of fruits and vegetables, plant-based proteins, whole-grain carbohydrates was discussed in detail with the patient.   A list for source of those nutrients were also provided to the patient.  Patient will use only water or unsweetened tea for hydration. B.  The need to stay away from risky substances including alcohol, smoking; obtaining 7 to 9 hours of restorative sleep, at least 150 minutes of moderate intensity exercise weekly, the importance of healthy social connections,  and stress management techniques were discussed. C.  A full color page of  Calorie density of various food groups per pound showing examples of each food groups was provided to the patient.   - he will be scheduled with Norm Salt, RDN, CDE for diabetes education.  - I have approached him with the following individualized plan to manage  his diabetes and patient agrees:  -Patient presents with continued overall improvement in his glycemic profile, with partial engagement in lifestyle medicine.   -Based on his presentation, he will not need prandial insulin for now.  I discussed and increase his Travis Sharp to 60 units nightly associated with continuous utility of his CGM device.   - he is encouraged to call clinic  for blood glucose levels less than 70 or above 200 mg /dl. He does not tolerate metformin.   -His workup did not show antipancreatic antibodies indicating likelihood of LADA in this patient.  He may benefit from low-dose GLP-1  receptor agonist or SGLT2 inhibitors on subsequent visits.    - Specific targets for  A1c;  LDL, HDL,  and Triglycerides were discussed with the patient.  2) Blood Pressure /Hypertension:  -His blood pressure is controlled to target.  he is advised to continue his current medications including Benicar 40/25 mg p.o. daily with breakfast .  3) Lipids/Hyperlipidemia:   Review of his recent lipid panel showed uncontrolled LDL at 109.  He is not on statins.  Whole food plant-based diet will help him manage lipids.  If LDL remains above 70 mg/DL, he will be considered for low-dose statins next visit.      4)  Weight/Diet:  Body mass index is 28.96 kg/m.  -      he is not a candidate for major weight loss.    Exercise, and detailed carbohydrates information provided  -  detailed on discharge instructions.  5) Chronic Care/Health Maintenance:  -he  is on ARB medications and  is encouraged to initiate and continue to follow up with Ophthalmology, Dentist, referred to ophthalmology, podiatrist at least yearly or according to recommendations, and advised to   stay away from smoking. I have recommended yearly flu vaccine and pneumonia vaccine at least every 5 years; moderate intensity exercise for up to 150 minutes weekly; and  sleep for at least 7 hours a day.  - he is  advised to maintain close follow up with Shawnie Dapper, PA-C for primary care needs, as well as his other providers for optimal and coordinated care.  I spent  26  minutes in the care of the patient today including review of labs from CMP, Lipids, Thyroid Function, Hematology (current and previous including abstractions from other facilities); face-to-face time discussing  his blood glucose readings/logs, discussing hypoglycemia and hyperglycemia episodes and symptoms, medications doses, his options of short and long term treatment based on the latest standards of care / guidelines;  discussion about incorporating lifestyle medicine;   and documenting the encounter. Risk reduction counseling performed per USPSTF guidelines to reduce  cardiovascular risk factors.     Please refer to Patient Instructions for Blood Glucose Monitoring and Insulin/Medications Dosing Guide"  in media tab for additional information. Please  also refer to " Patient Self Inventory" in the Media  tab for reviewed elements of pertinent patient history.  Travis Sharp participated in the discussions, expressed understanding, and voiced agreement with the above plans.  All questions were answered to his satisfaction. he is encouraged to contact clinic should he have any questions or concerns prior to his return visit.    Follow up plan: - Return in about 4 months (around 12/08/2022) for Bring Meter/CGM Device/Logs- A1c in Office, Urine MA - NV.  Marquis Lunch, MD [facility] [address] [place], [place] [address] Phone: [phone]  Fax: [phone]    08/08/2022, 11:13 AM  This note was partially dictated with voice recognition software. Similar sounding words can be transcribed inadequately or may not  be corrected upon review.

## 2022-08-09 ENCOUNTER — Other Ambulatory Visit: Payer: Self-pay

## 2022-08-09 DIAGNOSIS — E1165 Type 2 diabetes mellitus with hyperglycemia: Secondary | ICD-10-CM

## 2022-08-09 MED ORDER — FREESTYLE LIBRE 2 SENSOR MISC: 3 refills | Status: DC

## 2022-08-13 ENCOUNTER — Ambulatory Visit (INDEPENDENT_AMBULATORY_CARE_PROVIDER_SITE_OTHER): Payer: PPO | Admitting: Podiatry

## 2022-08-13 ENCOUNTER — Encounter: Payer: Self-pay | Admitting: Podiatry

## 2022-08-13 DIAGNOSIS — M79675 Pain in left toe(s): Secondary | ICD-10-CM | POA: Diagnosis not present

## 2022-08-13 DIAGNOSIS — B351 Tinea unguium: Secondary | ICD-10-CM

## 2022-08-13 DIAGNOSIS — E1159 Type 2 diabetes mellitus with other circulatory complications: Secondary | ICD-10-CM

## 2022-08-13 DIAGNOSIS — M79674 Pain in right toe(s): Secondary | ICD-10-CM

## 2022-08-13 NOTE — Progress Notes (Signed)
This patient returns to my office for at risk foot care.  This patient requires this care by a professional since this patient will be at risk due to having  diabetes.  This patient is unable to cut nails himself since the patient cannot reach his nails.These nails are painful walking and wearing shoes.  This patient presents for at risk foot care today.  General Appearance  Alert, conversant and in no acute stress.  Vascular  Dorsalis pedis and posterior tibial  pulses are palpable  bilaterally.  Capillary return is within normal limits  bilaterally. Temperature is within normal limits  bilaterally.  Neurologic  Senn-Weinstein monofilament wire test within normal limits  bilaterally. Muscle power within normal limits bilaterally.  Nails Thick disfigured discolored nails with subungual debris  from hallux to fifth toes bilaterally. No evidence of bacterial infection or drainage bilaterally.  Orthopedic  No limitations of motion  feet .  No crepitus or effusions noted.  No bony pathology or digital deformities noted.  Skin  normotropic skin with no porokeratosis noted bilaterally.  No signs of infections or ulcers noted.     Onychomycosis  Pain in right toes  Pain in left toes  Consent was obtained for treatment procedures.   Mechanical debridement of nails 1-5  bilaterally performed with a nail nipper.  Filed with dremel without incident.    Return office visit   3 months                   Told patient to return for periodic foot care and evaluation due to potential at risk complications.   Burna Atlas DPM   

## 2022-10-25 ENCOUNTER — Ambulatory Visit: Payer: BC Managed Care – PPO | Admitting: Internal Medicine

## 2022-11-07 ENCOUNTER — Other Ambulatory Visit (HOSPITAL_COMMUNITY): Payer: Self-pay | Admitting: Family Medicine

## 2022-11-07 DIAGNOSIS — E1140 Type 2 diabetes mellitus with diabetic neuropathy, unspecified: Secondary | ICD-10-CM

## 2022-11-07 DIAGNOSIS — G5603 Carpal tunnel syndrome, bilateral upper limbs: Secondary | ICD-10-CM

## 2022-11-13 ENCOUNTER — Ambulatory Visit (HOSPITAL_COMMUNITY): Admission: RE | Admit: 2022-11-13 | Payer: PPO | Source: Ambulatory Visit

## 2022-11-13 ENCOUNTER — Encounter (HOSPITAL_COMMUNITY): Payer: Self-pay

## 2022-11-26 ENCOUNTER — Other Ambulatory Visit: Payer: Self-pay | Admitting: *Deleted

## 2022-11-26 ENCOUNTER — Telehealth: Payer: Self-pay | Admitting: Nurse Practitioner

## 2022-11-26 DIAGNOSIS — E1165 Type 2 diabetes mellitus with hyperglycemia: Secondary | ICD-10-CM

## 2022-11-26 MED ORDER — TRESIBA FLEXTOUCH 200 UNIT/ML ~~LOC~~ SOPN: 60.0000 [IU] | PEN_INJECTOR | Freq: Every day | SUBCUTANEOUS | 2 refills | Status: DC

## 2022-11-26 NOTE — Telephone Encounter (Signed)
Patient was called and advised that per the last office note, he is to be taking 60 units. He was also advised that a prescription had been sent in the his pharmacy.Marland Kitchen

## 2022-11-26 NOTE — Telephone Encounter (Signed)
Patient called and made aware that per last office note he was to be taking 60 units. A prescription was sent to the patient's pharmacy for his [place].

## 2022-11-26 NOTE — Telephone Encounter (Signed)
Pt is asking for a refill on his Travis Sharp and wants to know how many units he is suppose to be taking. He thought it was 60 units. Please Advise.

## 2022-12-10 ENCOUNTER — Encounter: Payer: Self-pay | Admitting: "Endocrinology

## 2022-12-10 ENCOUNTER — Ambulatory Visit (INDEPENDENT_AMBULATORY_CARE_PROVIDER_SITE_OTHER): Payer: PPO | Admitting: "Endocrinology

## 2022-12-10 VITALS — BP 138/86 | HR 80 | Ht 70.0 in | Wt 199.2 lb

## 2022-12-10 DIAGNOSIS — E1165 Type 2 diabetes mellitus with hyperglycemia: Secondary | ICD-10-CM

## 2022-12-10 DIAGNOSIS — Z794 Long term (current) use of insulin: Secondary | ICD-10-CM | POA: Insufficient documentation

## 2022-12-10 DIAGNOSIS — I10 Essential (primary) hypertension: Secondary | ICD-10-CM

## 2022-12-10 DIAGNOSIS — E782 Mixed hyperlipidemia: Secondary | ICD-10-CM

## 2022-12-10 DIAGNOSIS — E559 Vitamin D deficiency, unspecified: Secondary | ICD-10-CM | POA: Diagnosis not present

## 2022-12-10 DIAGNOSIS — R809 Proteinuria, unspecified: Secondary | ICD-10-CM | POA: Insufficient documentation

## 2022-12-10 LAB — POCT UA - MICROALBUMIN
Creatinine, POC: 300 mg/dL
Microalbumin Ur, POC: 150 mg/L

## 2022-12-10 LAB — POCT GLYCOSYLATED HEMOGLOBIN (HGB A1C): HbA1c, POC (controlled diabetic range): 7.6 % — AB (ref 0.0–7.0)

## 2022-12-10 MED ORDER — TRESIBA FLEXTOUCH 200 UNIT/ML ~~LOC~~ SOPN: 70.0000 [IU] | PEN_INJECTOR | Freq: Every day | SUBCUTANEOUS | 1 refills | Status: DC

## 2022-12-10 MED ORDER — EMPAGLIFLOZIN 10 MG PO TABS: 10.0000 mg | ORAL_TABLET | Freq: Every day | ORAL | 1 refills | Status: DC

## 2022-12-10 NOTE — Progress Notes (Signed)
12/10/2022, 11:46 AM   Endocrinology follow-up note  Subjective:    Patient ID: Travis Sharp, male    DOB: Aug 18, 1955.  Travis Sharp is being seen in follow-up after he was seen in consultation for management of currently uncontrolled symptomatic diabetes requested by  Shawnie Dapper, PA-C.   Past Medical History:  Diagnosis Date   Colitis 03/23/2015   Diabetes mellitus    Gastritis    Hypertension     Past Surgical History:  Procedure Laterality Date   BIOPSY  05/10/2015   Procedure: BIOPSY;  Surgeon: West Bali, MD;  [facility];  Service: Endoscopy;;  gastric bx's   COLONOSCOPY WITH PROPOFOL N/A 05/10/2015   dr. Darrick Penna: Mild diverticulosis, hemorrhoids.  Next colonoscopy 10 years   ESOPHAGOGASTRODUODENOSCOPY (EGD) WITH PROPOFOL N/A 05/10/2015   Dr. Darrick Penna: dysphagai due to stricture at [facility] s/p dilation, mild nonerosive gastritis.    SAVORY DILATION N/A 05/10/2015   Procedure: SAVORY DILATION;  Surgeon: West Bali, MD;  [facility];  Service: Endoscopy;  Laterality: N/A;   TONSILLECTOMY      Social History   Socioeconomic History   Marital status: Married    Spouse name: Rinaldo Cloud   Number of children: 3   Years of education: Not on file   Highest education level: Not on file  Occupational History   Occupation: [employer]  Tobacco Use   Smoking status: Former    Current packs/day: 0.00    Average packs/day: 1 pack/day for 28.0 years (28.0 ttl pk-yrs)    Types: Cigarettes    Start date: 05/04/1977    Quit date: 05/04/2005    Years since quitting: 17.6   Smokeless tobacco: Never  Substance and Sexual Activity   Alcohol use: Not Currently    Alcohol/week: 0.0 standard drinks of alcohol    Comment: very seldom   Drug use: Not Currently    Types: Marijuana    Comment: denied 10/28/19   Sexual activity: Not on file  Other Topics Concern   Not on file  Social History Narrative   Not on file   Social  Determinants of Health   Financial Resource Strain: Low Risk  (10/21/2019)   Overall Financial Resource Strain (CARDIA)    Difficulty of Paying Living Expenses: Not hard at all  Food Insecurity: No Food Insecurity (10/21/2019)   Hunger Vital Sign    Worried About Running Out of Food in the Last Year: Never true    Ran Out of Food in the Last Year: Never true  Transportation Needs: No Transportation Needs (10/21/2019)   PRAPARE - Administrator, Civil Service (Medical): No    Lack of Transportation (Non-Medical): No  Physical Activity: Insufficiently Active (10/21/2019)   Exercise Vital Sign    Days of Exercise per Week: 3 days    Minutes of Exercise per Session: 30 min  Stress: No Stress Concern Present (10/21/2019)   [employer] of Occupational Health - Occupational Stress Questionnaire    Feeling of Stress : Only a little  Social Connections: Moderately Integrated (10/21/2019)   Social Connection and Isolation Panel [NHANES]    Frequency of Communication with Friends and Family: More than three times a week    Frequency of Social Gatherings with Friends and Family: More than three times a week    Attends Religious Services: 1 to 4 times per year    Active Member of Clubs or  Organizations: No    Attends Banker Meetings: Never    Marital Status: Married    Family History  Problem Relation Age of Onset   Colon cancer Other        possibly 2 uncles   Stroke Mother    Aneurysm Mother    Hypertension Mother    Heart attack Father    Hypertension Sister    Hypertension Brother    Hypertension Daughter     Outpatient Encounter Medications as of 12/10/2022  Medication Sig   albuterol (VENTOLIN HFA) 108 (90 Base) MCG/ACT inhaler    aspirin 81 MG tablet Take 81 mg by mouth daily.   celecoxib (CELEBREX) 200 MG capsule Take by mouth.   Continuous Blood Gluc Receiver (FREESTYLE LIBRE 2 READER) DEVI Use to check glucose four times daily as instructed   Continuous  Glucose Sensor (FREESTYLE LIBRE 2 SENSOR) MISC Patient needs to keep follow up for refills.   CVS D3 125 MCG (5000 UT) capsule TAKE 1 CAPSULE BY MOUTH EVERY DAY   glucose blood (ACCU-CHEK GUIDE) test strip Use as instructed   insulin degludec (TRESIBA FLEXTOUCH) 200 UNIT/ML FlexTouch Pen Inject 60 Units into the skin at bedtime.   olmesartan-hydrochlorothiazide (BENICAR HCT) 40-25 MG tablet Take 1 tablet by mouth daily.   ondansetron (ZOFRAN-ODT) 4 MG disintegrating tablet Take 1 tablet (4 mg total) by mouth every 8 (eight) hours as needed for nausea or vomiting.   No facility-administered encounter medications on file as of 12/10/2022.    ALLERGIES: No Known Allergies  VACCINATION STATUS: Immunization History  Administered Date(s) Administered   Influenza,inj,Quad PF,6+ Mos 03/24/2015   Moderna Sars-Covid-2 Vaccination 06/16/2019, 07/14/2019   Pneumococcal Polysaccharide-23 03/24/2015    Diabetes He presents for his follow-up diabetic visit. He has type 2 diabetes mellitus. Onset time: He was diagnosed at approximate age of 49 years. His disease course has been stable. There are no hypoglycemic associated symptoms. Pertinent negatives for hypoglycemia include no confusion, headaches, pallor or seizures. Pertinent negatives for diabetes include no blurred vision, no chest pain, no fatigue, no polydipsia, no polyphagia, no polyuria and no weakness. There are no hypoglycemic complications. Symptoms are stable. There are no diabetic complications. Risk factors for coronary artery disease include diabetes mellitus, hypertension, family history, male sex, tobacco exposure and dyslipidemia. Current diabetic treatment includes insulin injections. His weight is increasing steadily. He is following a generally unhealthy diet. When asked about meal planning, he reported none. He has not had a previous visit with a dietitian. He rarely participates in exercise. His home blood glucose trend is decreasing  steadily. His breakfast blood glucose range is generally 180-200 mg/dl. His lunch blood glucose range is generally 180-200 mg/dl. His dinner blood glucose range is generally 180-200 mg/dl. His bedtime blood glucose range is generally 180-200 mg/dl. His overall blood glucose range is 180-200 mg/dl. (He presents with above target glycemic profile.  He is CGM AGP report shows 46% time in range, 49% Level 1 hyperglycemia, 5% limited 2 hyperglycemia.  No hypoglycemia.  His point-of-care A1c is 7.6% increasing from 7% during his last visit.  This is however improving from 12.9% A1c during last year.      ) An ACE inhibitor/angiotensin II receptor blocker is being taken. Eye exam is not current.  Hypertension This is a chronic problem. The current episode started more than 1 year ago. Pertinent negatives include no blurred vision, chest pain, headaches, neck pain, palpitations or shortness of breath. Risk factors for coronary  artery disease include diabetes mellitus, male gender, smoking/tobacco exposure and family history. Past treatments include angiotensin blockers.     Review of Systems  Constitutional:  Negative for chills, fatigue, fever and unexpected weight change.  HENT:  Negative for dental problem, mouth sores and trouble swallowing.   Eyes:  Negative for blurred vision and visual disturbance.  Respiratory:  Negative for cough, choking, chest tightness, shortness of breath and wheezing.   Cardiovascular:  Negative for chest pain, palpitations and leg swelling.  Gastrointestinal:  Negative for abdominal distention, abdominal pain, constipation, diarrhea, nausea and vomiting.  Endocrine: Negative for polydipsia, polyphagia and polyuria.  Genitourinary:  Negative for dysuria, flank pain, hematuria and urgency.  Musculoskeletal:  Negative for back pain, gait problem, myalgias and neck pain.  Skin:  Negative for pallor, rash and wound.  Neurological:  Negative for seizures, syncope, weakness,  numbness and headaches.  Psychiatric/Behavioral:  Negative for confusion and dysphoric mood.     Objective:       12/10/2022   11:34 AM 08/08/2022   10:03 AM 08/08/2022    9:50 AM  Vitals with BMI  Height 5\' 10"   5\' 10"   Weight 199 lbs 3 oz  201 lbs 13 oz  BMI 28.58  28.96  Systolic 138 138 564  Diastolic 86 86 88  Pulse 80  80    BP 138/86   Pulse 80   Ht 5\' 10"  (1.778 m)   Wt 199 lb 3.2 oz (90.4 kg)   BMI 28.58 kg/m   Wt Readings from Last 3 Encounters:  12/10/22 199 lb 3.2 oz (90.4 kg)  08/08/22 201 lb 12.8 oz (91.5 kg)  02/28/22 192 lb 12.8 oz (87.5 kg)       CMP ( most recent) CMP     Component Value Date/Time   NA 138 01/15/2022 2325   NA 133 (L) 11/14/2021 1117   K 3.4 (L) 01/15/2022 2325   CL 107 01/15/2022 2325   CO2 19 (L) 01/15/2022 2325   GLUCOSE 195 (H) 01/15/2022 2325   BUN 15 01/15/2022 2325   BUN 25 11/14/2021 1117   CREATININE 1.03 01/15/2022 2325   CREATININE 0.89 10/26/2015 0758   CALCIUM 9.5 01/15/2022 2325   PROT 8.3 (H) 01/15/2022 2325   PROT 6.9 11/14/2021 1117   ALBUMIN 4.6 01/15/2022 2325   ALBUMIN 4.4 11/14/2021 1117   AST 33 01/15/2022 2325   ALT 23 01/15/2022 2325   ALKPHOS 51 01/15/2022 2325   BILITOT 1.4 (H) 01/15/2022 2325   BILITOT 0.6 11/14/2021 1117   GFRNONAA >60 01/15/2022 2325   GFRAA 70 10/21/2019 0000     Diabetic Labs (most recent): Lab Results  Component Value Date   HGBA1C 7.8 (A) 08/08/2022   HGBA1C 7.0 02/28/2022   HGBA1C 12.9 (A) 11/20/2021   MICROALBUR 0.5 10/26/2015     Lipid Panel ( most recent) Lipid Panel     Component Value Date/Time   CHOL 169 08/02/2022 1332   TRIG 147 08/02/2022 1332   HDL 38 (L) 08/02/2022 1332   CHOLHDL 4.4 08/02/2022 1332   CHOLHDL 3.0 10/26/2015 0758   VLDL 16 10/26/2015 0758   LDLCALC 105 (H) 08/02/2022 1332   LABVLDL 26 08/02/2022 1332      Lab Results  Component Value Date   TSH 1.600 11/14/2021   TSH 1.510 11/08/2020   TSH 1.62 10/21/2019   TSH 1.23  10/26/2015   FREET4 1.20 11/14/2021   FREET4 1.13 11/08/2020   FREET4 1.1 10/26/2015  Assessment & Plan:   1. Uncontrolled type 2 diabetes mellitus with urine microalbuminemia    - Travis Sharp has currently uncontrolled symptomatic type 2 DM since  67 years of age.  He presents with above target glycemic profile.  He is CGM AGP report shows 46% time in range, 49% Level 1 hyperglycemia, 5% limited 2 hyperglycemia.  No hypoglycemia.  His point-of-care A1c is 7.6% increasing from 7% during his last visit.  This is however improving from 12.9% A1c during last year.     Recent labs reviewed.  - I had a long discussion with him about the progressive nature of diabetes and the pathology behind its complications. -his diabetes is complicated by nonfollow-up and he remains at a high risk for more acute and chronic complications which include CAD, CVA, CKD, retinopathy, and neuropathy. These are all discussed in detail with him.  - I have counseled him on diet  and weight management  by adopting a carbohydrate restricted/protein rich diet. Patient is encouraged to switch to  unprocessed or minimally processed     complex starch and increased protein intake (animal or plant source), fruits, and vegetables. -  he is advised to stick to a routine mealtimes to eat 3 meals  a day and avoid unnecessary snacks ( to snack only to correct hypoglycemia).   This patient is benefiting from lifestyle medicine.  - he acknowledges that there is a room for improvement in his food and drink choices. - Suggestion is made for him to avoid simple carbohydrates  from his diet including Cakes, Sweet Desserts, Ice Cream, Soda (diet and regular), Sweet Tea, Candies, Chips, Cookies, Store Bought Juices, Alcohol , Artificial Sweeteners,  Coffee Creamer, and "Sugar-free" Products, Lemonade. This will help patient to have more stable blood glucose profile and potentially avoid unintended weight gain.  The following  Lifestyle Medicine recommendations according to American College of Lifestyle Medicine  [facility]) were discussed and and offered to patient and he  agrees to start the journey:  A. Whole Foods, Plant-Based Nutrition comprising of fruits and vegetables, plant-based proteins, whole-grain carbohydrates was discussed in detail with the patient.   A list for source of those nutrients were also provided to the patient.  Patient will use only water or unsweetened tea for hydration. B.  The need to stay away from risky substances including alcohol, smoking; obtaining 7 to 9 hours of restorative sleep, at least 150 minutes of moderate intensity exercise weekly, the importance of healthy social connections,  and stress management techniques were discussed. C.  A full color page of  Calorie density of various food groups per pound showing examples of each food groups was provided to the patient.   - he will be scheduled with Norm Salt, RDN, CDE for diabetes education.  - I have approached him with the following individualized plan to manage  his diabetes and patient agrees:  -Patient presents with continued overall improvement in his glycemic profile, with partial engagement in lifestyle medicine.   -Based on his presentation, he will not need prandial insulin for now.  However, I advised him to increase his [place] to 70 units nightly associated with continuous utility of his CGM.    - he is encouraged to call clinic for blood glucose levels less than 70 or above 200 mg /dl. He does not tolerate metformin.  In light of his presentation was microalbuminuria, he would benefit from early initiation of SGLT2 inhibitors.  I discussed and prescribed Jardiance 10  mg p.o. daily breakfast.  Side effects and precautions discussed with him.   - Specific targets for  A1c;  LDL, HDL,  and Triglycerides were discussed with the patient.  2) Blood Pressure /Hypertension:  -His blood pressure is controlled to target.   he is advised to continue his current medications including Benicar 40/25 mg p.o. daily with breakfast .  3) Lipids/Hyperlipidemia:   Review of his recent lipid panel showed uncontrolled LDL at 109.  He is not on statins.  Whole food plant-based diet will help him manage lipids.  If LDL remains above 70 mg/DL, he will be considered for low-dose statins next visit.      4)  Weight/Diet:  Body mass index is 28.58 kg/m.  -      he is not a candidate for major weight loss.    Exercise, and detailed carbohydrates information provided  -  detailed on discharge instructions.  5) Chronic Care/Health Maintenance:  -he  is on ARB medications and  is encouraged to initiate and continue to follow up with Ophthalmology, Dentist, referred to ophthalmology, podiatrist at least yearly or according to recommendations, and advised to   stay away from smoking. I have recommended yearly flu vaccine and pneumonia vaccine at least every 5 years; moderate intensity exercise for up to 150 minutes weekly; and  sleep for at least 7 hours a day.  - he is  advised to maintain close follow up with Shawnie Dapper, PA-C for primary care needs, as well as his other providers for optimal and coordinated care.    I spent  26  minutes in the care of the patient today including review of labs from CMP, Lipids, Thyroid Function, Hematology (current and previous including abstractions from other facilities); face-to-face time discussing  his blood glucose readings/logs, discussing hypoglycemia and hyperglycemia episodes and symptoms, medications doses, his options of short and long term treatment based on the latest standards of care / guidelines;  discussion about incorporating lifestyle medicine;  and documenting the encounter. Risk reduction counseling performed per USPSTF guidelines to reduce  cardiovascular risk factors.     Please refer to Patient Instructions for Blood Glucose Monitoring and Insulin/Medications Dosing Guide"   in media tab for additional information. Please  also refer to " Patient Self Inventory" in the Media  tab for reviewed elements of pertinent patient history.  Travis Sharp participated in the discussions, expressed understanding, and voiced agreement with the above plans.  All questions were answered to his satisfaction. he is encouraged to contact clinic should he have any questions or concerns prior to his return visit.    Follow up plan: - No follow-ups on file.  Marquis Lunch, MD [facility] [address] [place], [place] [address] Phone: [phone]  Fax: [phone]    12/10/2022, 11:46 AM  This note was partially dictated with voice recognition software. Similar sounding words can be transcribed inadequately or may not  be corrected upon review.

## 2022-12-10 NOTE — Patient Instructions (Signed)

## 2023-01-02 ENCOUNTER — Encounter: Payer: Self-pay | Admitting: Cardiology

## 2023-01-02 ENCOUNTER — Ambulatory Visit: Payer: PPO | Attending: Internal Medicine | Admitting: Cardiology

## 2023-01-02 ENCOUNTER — Other Ambulatory Visit: Payer: Self-pay | Admitting: "Endocrinology

## 2023-01-02 VITALS — BP 150/90 | HR 81 | Ht 70.0 in | Wt 198.2 lb

## 2023-01-02 DIAGNOSIS — I10 Essential (primary) hypertension: Secondary | ICD-10-CM

## 2023-01-02 DIAGNOSIS — R0602 Shortness of breath: Secondary | ICD-10-CM | POA: Diagnosis not present

## 2023-01-02 DIAGNOSIS — E1165 Type 2 diabetes mellitus with hyperglycemia: Secondary | ICD-10-CM

## 2023-01-02 DIAGNOSIS — E119 Type 2 diabetes mellitus without complications: Secondary | ICD-10-CM | POA: Diagnosis not present

## 2023-01-02 MED ORDER — ATORVASTATIN CALCIUM 20 MG PO TABS: 20.0000 mg | ORAL_TABLET | Freq: Every day | ORAL | 2 refills | Status: DC

## 2023-01-02 NOTE — Progress Notes (Signed)
Clinical Summary Mr. Tipple is a 67 y.o.male seen as a new patient, last seen in our office in 10/2015  1. SOB - started 3-4 months ago. Noted walking up steps at work that he used to tolerate pretty well - can get SOB around fumes at work as well. Can be laughing and feel SOB. Former tobacco x 20 years - occasional LE edema x 2- 3 months. Occasional orthopnea - occasional chest pains he attributes to heart burn. Sharp pain left or right chest, mild in severity. Can occur at rest or with exertion. No other associated symptoms. Not positional. Lasts just a few seconds. Occurs 1-2 times a week. Ongoing x 2-3 months.   CAD risk: former tobacco, HTN, DM2, father MI 68, paternal aunt MI 6-50s.   - 2017 completed an echo stress echo that were both overall unremarkable.    - some SOB at night, wakes up gasping for breath. +snoring episode, some apneic episodes, no specific daytime somnolence. Sleep cycle is difficult with working nights - walks playing golf 3 times a week, no exertional symptoms - not interested in sleep study   2. DM2 - A1c 1 year ago as high as 12.9, most recently 7.6 - followed by Dr Fransico Him - has not been on statin.    3. HTN  - compliant with meds - recent visit with endo visit 138/86 - reports high sodium intake       SH: son just graduated [employer].      Past Medical History:  Diagnosis Date   Colitis 03/23/2015   Diabetes mellitus    Gastritis    Hypertension      No Known Allergies   Current Outpatient Medications  Medication Sig Dispense Refill   albuterol (VENTOLIN HFA) 108 (90 Base) MCG/ACT inhaler      aspirin 81 MG tablet Take 81 mg by mouth daily.     celecoxib (CELEBREX) 200 MG capsule Take by mouth.     Continuous Blood Gluc Receiver (FREESTYLE LIBRE 2 READER) DEVI Use to check glucose four times daily as instructed 1 each 0   Continuous Glucose Sensor (FREESTYLE LIBRE 2 SENSOR) MISC Patient needs to keep follow up  for refills. 2 each 3   CVS D3 125 MCG (5000 UT) capsule TAKE 1 CAPSULE BY MOUTH EVERY DAY 100 capsule 0   empagliflozin (JARDIANCE) 10 MG TABS tablet Take 1 tablet (10 mg total) by mouth daily before breakfast. 90 tablet 1   glucose blood (ACCU-CHEK GUIDE) test strip Use as instructed 150 each 2   insulin degludec (TRESIBA FLEXTOUCH) 200 UNIT/ML FlexTouch Pen Inject 70 Units into the skin at bedtime. 30 mL 1   olmesartan-hydrochlorothiazide (BENICAR HCT) 40-25 MG tablet Take 1 tablet by mouth daily.     ondansetron (ZOFRAN-ODT) 4 MG disintegrating tablet Take 1 tablet (4 mg total) by mouth every 8 (eight) hours as needed for nausea or vomiting. 20 tablet 0   No current facility-administered medications for this visit.     Past Surgical History:  Procedure Laterality Date   BIOPSY  05/10/2015   Procedure: BIOPSY;  Surgeon: West Bali, MD;  [facility];  Service: Endoscopy;;  gastric bx's   COLONOSCOPY WITH PROPOFOL N/A 05/10/2015   dr. Darrick Penna: Mild diverticulosis, hemorrhoids.  Next colonoscopy 10 years   ESOPHAGOGASTRODUODENOSCOPY (EGD) WITH PROPOFOL N/A 05/10/2015   Dr. Darrick Penna: dysphagai due to stricture at [facility] s/p dilation, mild nonerosive gastritis.  SAVORY DILATION N/A 05/10/2015   Procedure: SAVORY DILATION;  Surgeon: West Bali, MD;  [facility];  Service: Endoscopy;  Laterality: N/A;   TONSILLECTOMY       No Known Allergies    Family History  Problem Relation Age of Onset   Colon cancer Other        possibly 2 uncles   Stroke Mother    Aneurysm Mother    Hypertension Mother    Heart attack Father    Hypertension Sister    Hypertension Brother    Hypertension Daughter      Social History Mr. Pruette reports that he quit smoking about 17 years ago. He started smoking about 45 years ago. He has a 28 pack-year smoking history. He has never used smokeless tobacco. Mr. Hellyer reports that he does not currently use alcohol.   Review of  Systems CONSTITUTIONAL: No weight loss, fever, chills, weakness or fatigue.  HEENT: Eyes: No visual loss, blurred vision, double vision or yellow sclerae.No hearing loss, sneezing, congestion, runny nose or sore throat.  SKIN: No rash or itching.  CARDIOVASCULAR: per hpi RESPIRATORY: No shortness of breath, cough or sputum.  GASTROINTESTINAL: No anorexia, nausea, vomiting or diarrhea. No abdominal pain or blood.  GENITOURINARY: No burning on urination, no polyuria NEUROLOGICAL: No headache, dizziness, syncope, paralysis, ataxia, numbness or tingling in the extremities. No change in bowel or bladder control.  MUSCULOSKELETAL: No muscle, back pain, joint pain or stiffness.  LYMPHATICS: No enlarged nodes. No history of splenectomy.  PSYCHIATRIC: No history of depression or anxiety.  ENDOCRINOLOGIC: No reports of sweating, cold or heat intolerance. No polyuria or polydipsia.  Marland Kitchen   Physical Examination Today's Vitals   01/02/23 1027  BP: (!) 138/90  Pulse: 81  SpO2: 97%  Weight: 198 lb 3.2 oz (89.9 kg)  Height: 5\' 10"  (1.778 m)   Body mass index is 28.44 kg/m.  Gen: resting comfortably, no acute distress HEENT: no scleral icterus, pupils equal round and reactive, no palptable cervical adenopathy,  CV: RRR, no mrg, no jvd Resp: Clear to auscultation bilaterally GI: abdomen is soft, non-tender, non-distended, normal bowel sounds, no hepatosplenomegaly MSK: extremities are warm, no edema.  Skin: warm, no rash Neuro:  no focal deficits Psych: appropriate affect   Diagnostic Studies  10/2015 echo Study Conclusions   - Left ventricle: The cavity size was normal. Wall thickness was   increased in a pattern of mild LVH. Systolic function was normal.   The estimated ejection fraction was in the range of 55% to 60%.   Wall motion was normal; there were no regional wall motion   abnormalities. Doppler parameters are consistent with abnormal   left ventricular relaxation (grade 1  diastolic dysfunction). - Aortic valve: Valve area (VTI): 2.74 cm^2. Valve area (Vmax):   3.19 cm^2. Valve area (Vmean): 2.76 cm^2.   10/2015 stress echo Study Conclusions   - Stress ECG conclusions: The stress ECG was normal. [facility] scoring:   exercise time of 8 min; maximum ST deviation of 0 mm; no angina;   resulting score is 8. This score predicts a low risk of cardiac   events. - Staged echo: Normal echo stress   Impressions:   - Normal study after maximal exercise.       Assessment and Plan  1. SOB - no recent exertional symptoms. Prior cardiac testing benign in 2017 - complains more of SOB at night, apneic episodes. Discussed sleep study but he is not in favor  at this time - no indication for any cardiac testing at this time - EKG shows SR, no specific ischemic changes  2. DM2 - followed by Dr Fransico Him - will start atorvastatin 20mg  daily in setting of DM2.   3. HTN - elevated today. He will work on diet and exercise, come back for bp check in 2 months - if needed would add norvasc  F/u 6 months. When bp's are controlled would f/u annually   Antoine Poche, M.D.

## 2023-01-02 NOTE — Patient Instructions (Addendum)
Medication Instructions:  Your physician has recommended you make the following change in your medication:  Start taking Atorvastatin 20 mg once daily Continue taking all other medications as prescribed  Labwork: None  Testing/Procedures: None  Follow-Up: Your physician recommends that you schedule a follow-up appointment in: Nurse visit in 2 months & 6 month follow up  DASH Eating Plan DASH stands for Dietary Approaches to Stop Hypertension. The DASH eating plan is a healthy eating plan that has been shown to: Lower high blood pressure (hypertension). Reduce your risk for type 2 diabetes, heart disease, and stroke. Help with weight loss. What are tips for following this plan? Reading food labels Check food labels for the amount of salt (sodium) per serving. Choose foods with less than 5 percent of the Daily Value (DV) of sodium. In general, foods with less than 300 milligrams (mg) of sodium per serving fit into this eating plan. To find whole grains, look for the word "whole" as the first word in the ingredient list. Shopping Buy products labeled as "low-sodium" or "no salt added." Buy fresh foods. Avoid canned foods and pre-made or frozen meals. Cooking Try not to add salt when you cook. Use salt-free seasonings or herbs instead of table salt or sea salt. Check with your health care provider or pharmacist before using salt substitutes. Do not fry foods. Cook foods in healthy ways, such as baking, boiling, grilling, roasting, or broiling. Cook using oils that are good for your heart. These include olive, canola, avocado, soybean, and sunflower oil. Meal planning  Eat a balanced diet. This should include: 4 or more servings of fruits and 4 or more servings of vegetables each day. Try to fill half of your plate with fruits and vegetables. 6-8 servings of whole grains each day. 6 or less servings of lean meat, poultry, or fish each day. 1 oz is 1 serving. A 3 oz (85 g) serving of  meat is about the same size as the palm of your hand. One egg is 1 oz (28 g). 2-3 servings of low-fat dairy each day. One serving is 1 cup (237 mL). 1 serving of nuts, seeds, or beans 5 times each week. 2-3 servings of heart-healthy fats. Healthy fats called omega-3 fatty acids are found in foods such as walnuts, flaxseeds, fortified milks, and eggs. These fats are also found in cold-water fish, such as sardines, salmon, and mackerel. Limit how much you eat of: Canned or prepackaged foods. Food that is high in trans fat, such as fried foods. Food that is high in saturated fat, such as fatty meat. Desserts and other sweets, sugary drinks, and other foods with added sugar. Full-fat dairy products. Do not salt foods before eating. Do not eat more than 4 egg yolks a week. Try to eat at least 2 vegetarian meals a week. Eat more home-cooked food and less restaurant, buffet, and fast food. Lifestyle When eating at a restaurant, ask if your food can be made with less salt or no salt. If you drink alcohol: Limit how much you have to: 0-1 drink a day if you are male. 0-2 drinks a day if you are male. Know how much alcohol is in your drink. In the [place] one drink is one 12 oz bottle of beer (355 mL), one 5 oz glass of wine (148 mL), or one 1 oz glass of hard liquor (44 mL). General information Avoid eating more than 2,300 mg of salt a day. If you have hypertension, you may need  to reduce your sodium intake to 1,500 mg a day. Work with your provider to stay at a healthy body weight or lose weight. Ask what the best weight range is for you. On most days of the week, get at least 30 minutes of exercise that causes your heart to beat faster. This may include walking, swimming, or biking. Work with your provider or dietitian to adjust your eating plan to meet your specific calorie needs. What foods should I eat? Fruits All fresh, dried, or frozen fruit. Canned fruits that are in their natural juice  and do not have sugar added to them. Vegetables Fresh or frozen vegetables that are raw, steamed, roasted, or grilled. Low-sodium or reduced-sodium tomato and vegetable juice. Low-sodium or reduced-sodium tomato sauce and tomato paste. Low-sodium or reduced-sodium canned vegetables. Grains Whole-grain or whole-wheat bread. Whole-grain or whole-wheat pasta. Brown rice. Orpah Cobb. Bulgur. Whole-grain and low-sodium cereals. Pita bread. Low-fat, low-sodium crackers. Whole-wheat flour tortillas. Meats and other proteins Skinless chicken or [place]. Ground chicken or [place]. Pork with fat trimmed off. Fish and seafood. Egg whites. Dried beans, peas, or lentils. Unsalted nuts, nut butters, and seeds. Unsalted canned beans. Lean cuts of beef with fat trimmed off. Low-sodium, lean precooked or cured meat, such as sausages or meat loaves. Dairy Low-fat (1%) or fat-free (skim) milk. Reduced-fat, low-fat, or fat-free cheeses. Nonfat, low-sodium ricotta or cottage cheese. Low-fat or nonfat yogurt. Low-fat, low-sodium cheese. Fats and oils Soft margarine without trans fats. Vegetable oil. Reduced-fat, low-fat, or light mayonnaise and salad dressings (reduced-sodium). Canola, safflower, olive, avocado, soybean, and sunflower oils. Avocado. Seasonings and condiments Herbs. Spices. Seasoning mixes without salt. Other foods Unsalted popcorn and pretzels. Fat-free sweets. The items listed above may not be all the foods and drinks you can have. Talk to a dietitian to learn more. What foods should I avoid? Fruits Canned fruit in a light or heavy syrup. Fried fruit. Fruit in cream or butter sauce. Vegetables Creamed or fried vegetables. Vegetables in a cheese sauce. Regular canned vegetables that are not marked as low-sodium or reduced-sodium. Regular canned tomato sauce and paste that are not marked as low-sodium or reduced-sodium. Regular tomato and vegetable juices that are not marked as low-sodium or  reduced-sodium. Rosita Fire. Olives. Grains Baked goods made with fat, such as croissants, muffins, or some breads. Dry pasta or rice meal packs. Meats and other proteins Fatty cuts of meat. Ribs. Fried meat. Tomasa Blase. Bologna, salami, and other precooked or cured meats, such as sausages or meat loaves, that are not lean and low in sodium. Fat from the back of a pig (fatback). Bratwurst. Salted nuts and seeds. Canned beans with added salt. Canned or smoked fish. Whole eggs or egg yolks. Chicken or [place] with skin. Dairy Whole or 2% milk, cream, and half-and-half. Whole or full-fat cream cheese. Whole-fat or sweetened yogurt. Full-fat cheese. Nondairy creamers. Whipped toppings. Processed cheese and cheese spreads. Fats and oils Butter. Stick margarine. Lard. Shortening. Ghee. Bacon fat. Tropical oils, such as coconut, palm kernel, or palm oil. Seasonings and condiments Onion salt, garlic salt, seasoned salt, table salt, and sea salt. Worcestershire sauce. Tartar sauce. Barbecue sauce. Teriyaki sauce. Soy sauce, including reduced-sodium soy sauce. Steak sauce. Canned and packaged gravies. Fish sauce. Oyster sauce. Cocktail sauce. Store-bought horseradish. Ketchup. Mustard. Meat flavorings and tenderizers. Bouillon cubes. Hot sauces. Pre-made or packaged marinades. Pre-made or packaged taco seasonings. Relishes. Regular salad dressings. Other foods Salted popcorn and pretzels. The items listed above may not be all the foods  and drinks you should avoid. Talk to a dietitian to learn more. Where to find more information National Heart, Lung, and Blood Institute (NHLBI): BuffaloDryCleaner.gl American Heart Association (AHA): heart.org Academy of Nutrition and Dietetics: eatright.org National Kidney Foundation (NKF): kidney.org This information is not intended to replace advice given to you by your health care provider. Make sure you discuss any questions you have with your health care provider. Document Revised:  04/19/2022 Document Reviewed: 04/19/2022 Elsevier Patient Education  2024 Elsevier Inc.   Any Other Special Instructions Will Be Listed Below (If Applicable).  If you need a refill on your cardiac medications before your next appointment, please call your pharmacy.

## 2023-01-31 ENCOUNTER — Ambulatory Visit (INDEPENDENT_AMBULATORY_CARE_PROVIDER_SITE_OTHER): Payer: PPO | Admitting: Podiatry

## 2023-01-31 ENCOUNTER — Encounter: Payer: Self-pay | Admitting: Podiatry

## 2023-01-31 DIAGNOSIS — E1159 Type 2 diabetes mellitus with other circulatory complications: Secondary | ICD-10-CM

## 2023-01-31 DIAGNOSIS — B351 Tinea unguium: Secondary | ICD-10-CM | POA: Diagnosis not present

## 2023-01-31 DIAGNOSIS — M79674 Pain in right toe(s): Secondary | ICD-10-CM | POA: Diagnosis not present

## 2023-01-31 DIAGNOSIS — M79675 Pain in left toe(s): Secondary | ICD-10-CM

## 2023-02-06 NOTE — Progress Notes (Addendum)
Subjective:  Patient ID: Travis Sharp, male    DOB: [birth year],  MRN: [id number]  67 y.o. male presents preventative diabetic foot care and painful elongated mycotic toenails 1-5 bilaterally which are tender when wearing enclosed shoe gear. Pain is relieved with periodic professional debridement. Patient states he enjoys playing golf.  New problem(s): None   PCP is Assunta Found, MD. Theron Arista August, 2024.  No Known Allergies  Review of Systems: Negative except as noted in the HPI.   Objective:  Travis Sharp is a pleasant 67 y.o. male WD, WN in NAD.Marland Kitchen AAO x 3.  Vascular Examination: Vascular status intact b/l with palpable pedal pulses. CFT immediate b/l. Pedal hair present. No edema. No pain with calf compression b/l. Skin temperature gradient WNL b/l. No varicosities noted. No cyanosis or clubbing noted.  Neurological Examination: Sensation grossly intact b/l with 10 gram monofilament. Vibratory sensation intact b/l.  Dermatological Examination: Pedal skin with normal turgor, texture and tone b/l. No open wounds nor interdigital macerations noted. Toenails 1-5 b/l thick, discolored, elongated with subungual debris and pain on dorsal palpation. No hyperkeratotic lesions noted b/l.   Musculoskeletal Examination: Muscle strength 5/5 to b/l LE.  No pain, crepitus noted b/l. No gross pedal deformities. Patient ambulates independently without assistive aids.   Radiographs: None Last A1c:      Latest Ref Rng & Units 12/10/2022   11:51 AM 08/08/2022   10:07 AM 02/28/2022    3:38 PM  Hemoglobin A1C  Hemoglobin-A1c 0.0 - 7.0 % 7.6  7.8  7.0    Assessment:   1. Pain due to onychomycosis of toenails of both feet   2. Type 2 diabetes mellitus with vascular disease (HCC)    Plan:  -Consent given for treatment as described below: -Examined patient. -Continue foot and shoe inspections daily. Monitor blood glucose per PCP/Endocrinologist's recommendations. -Continue supportive shoe gear  daily. -Toenails 1-5 b/l were debrided in length and girth with sterile nail nippers and dremel without iatrogenic bleeding.  -Patient/POA to call should there be question/concern in the interim.  Return in about 3 months (around 05/03/2023).  Freddie Breech, DPM

## 2023-02-20 ENCOUNTER — Ambulatory Visit: Payer: PPO | Attending: Cardiology

## 2023-02-21 ENCOUNTER — Encounter: Payer: Self-pay | Admitting: Cardiology

## 2023-03-13 ENCOUNTER — Encounter: Payer: Self-pay | Admitting: "Endocrinology

## 2023-03-13 LAB — HM DIABETES EYE EXAM

## 2023-03-26 LAB — LIPID PANEL
Chol/HDL Ratio: 4.7 {ratio} (ref 0.0–5.0)
Cholesterol, Total: 169 mg/dL (ref 100–199)
HDL: 36 mg/dL — ABNORMAL LOW (ref >39)
LDL Chol Calc (NIH): 112 mg/dL — ABNORMAL HIGH (ref 0–99)
Triglycerides: 116 mg/dL (ref 0–149)
VLDL Cholesterol Cal: 21 mg/dL (ref 5–40)

## 2023-03-26 LAB — COMPREHENSIVE METABOLIC PANEL
ALT: 29 [IU]/L (ref 0–44)
AST: 29 [IU]/L (ref 0–40)
Albumin: 4.6 g/dL (ref 3.9–4.9)
Alkaline Phosphatase: 71 [IU]/L (ref 44–121)
BUN/Creatinine Ratio: 12 (ref 10–24)
BUN: 14 mg/dL (ref 8–27)
Bilirubin Total: 0.7 mg/dL (ref 0.0–1.2)
CO2: 22 mmol/L (ref 20–29)
Calcium: 9.5 mg/dL (ref 8.6–10.2)
Chloride: 101 mmol/L (ref 96–106)
Creatinine, Ser: 1.16 mg/dL (ref 0.76–1.27)
Globulin, Total: 2.9 g/dL (ref 1.5–4.5)
Glucose: 94 mg/dL (ref 70–99)
Potassium: 4.3 mmol/L (ref 3.5–5.2)
Sodium: 141 mmol/L (ref 134–144)
Total Protein: 7.5 g/dL (ref 6.0–8.5)
eGFR: 69 mL/min/{1.73_m2} (ref >59)

## 2023-03-26 LAB — T4, FREE: Free T4: 0.99 ng/dL (ref 0.82–1.77)

## 2023-03-26 LAB — TSH: TSH: 2.82 u[IU]/mL (ref 0.450–4.500)

## 2023-03-28 ENCOUNTER — Encounter: Payer: Self-pay | Admitting: "Endocrinology

## 2023-03-28 ENCOUNTER — Ambulatory Visit: Payer: PPO | Admitting: "Endocrinology

## 2023-03-28 VITALS — BP 148/82 | HR 96 | Ht 70.0 in | Wt 199.2 lb

## 2023-03-28 DIAGNOSIS — E782 Mixed hyperlipidemia: Secondary | ICD-10-CM

## 2023-03-28 DIAGNOSIS — E1165 Type 2 diabetes mellitus with hyperglycemia: Secondary | ICD-10-CM | POA: Diagnosis not present

## 2023-03-28 DIAGNOSIS — I10 Essential (primary) hypertension: Secondary | ICD-10-CM

## 2023-03-28 DIAGNOSIS — E559 Vitamin D deficiency, unspecified: Secondary | ICD-10-CM | POA: Diagnosis not present

## 2023-03-28 DIAGNOSIS — Z794 Long term (current) use of insulin: Secondary | ICD-10-CM

## 2023-03-28 LAB — POCT GLYCOSYLATED HEMOGLOBIN (HGB A1C): HbA1c, POC (controlled diabetic range): 6.7 % (ref 0.0–7.0)

## 2023-03-28 MED ORDER — TRESIBA FLEXTOUCH 200 UNIT/ML ~~LOC~~ SOPN: 50.0000 [IU] | PEN_INJECTOR | Freq: Every day | SUBCUTANEOUS | 1 refills | Status: DC

## 2023-03-28 NOTE — Patient Instructions (Signed)

## 2023-03-28 NOTE — Progress Notes (Signed)
03/28/2023, 2:10 PM   Endocrinology follow-up note  Subjective:    Patient ID: Travis Sharp, male    DOB: 1955/08/21.  Travis Sharp is being seen in follow-up after he was seen in consultation for management of currently uncontrolled symptomatic diabetes requested by  Assunta Found, MD.   Past Medical History:  Diagnosis Date   Colitis 03/23/2015   Diabetes mellitus    Gastritis    Hypertension     Past Surgical History:  Procedure Laterality Date   BIOPSY  05/10/2015   Procedure: BIOPSY;  Surgeon: West Bali, MD;  Location: [facility];  Service: Endoscopy;;  gastric bx's   COLONOSCOPY WITH PROPOFOL N/A 05/10/2015   dr. Darrick Penna: Mild diverticulosis, hemorrhoids.  Next colonoscopy 10 years   ESOPHAGOGASTRODUODENOSCOPY (EGD) WITH PROPOFOL N/A 05/10/2015   Dr. Darrick Penna: dysphagai due to stricture at [facility] s/p dilation, mild nonerosive gastritis.    SAVORY DILATION N/A 05/10/2015   Procedure: SAVORY DILATION;  Surgeon: West Bali, MD;  Location: [facility];  Service: Endoscopy;  Laterality: N/A;   TONSILLECTOMY      Social History   Socioeconomic History   Marital status: Married    Spouse name: Rinaldo Cloud   Number of children: 3   Years of education: Not on file   Highest education level: Not on file  Occupational History   Occupation: [employer]  Tobacco Use   Smoking status: Former    Current packs/day: 0.00    Average packs/day: 1 pack/day for 28.0 years (28.0 ttl pk-yrs)    Types: Cigarettes    Start date: 05/04/1977    Quit date: 05/04/2005    Years since quitting: 17.9   Smokeless tobacco: Never  Substance and Sexual Activity   Alcohol use: Not Currently    Alcohol/week: 0.0 standard drinks of alcohol    Comment: very seldom   Drug use: Not Currently    Types: Marijuana    Comment: denied 10/28/19   Sexual activity: Not on file  Other Topics Concern   Not on file  Social History Narrative   Not on file   Social Drivers  of Health   Financial Resource Strain: Low Risk  (10/21/2019)   Overall Financial Resource Strain (CARDIA)    Difficulty of Paying Living Expenses: Not hard at all  Food Insecurity: No Food Insecurity (10/21/2019)   Hunger Vital Sign    Worried About Running Out of Food in the Last Year: Never true    Ran Out of Food in the Last Year: Never true  Transportation Needs: No Transportation Needs (10/21/2019)   PRAPARE - Administrator, Civil Service (Medical): No    Lack of Transportation (Non-Medical): No  Physical Activity: Insufficiently Active (10/21/2019)   Exercise Vital Sign    Days of Exercise per Week: 3 days    Minutes of Exercise per Session: 30 min  Stress: No Stress Concern Present (10/21/2019)   [employer] of Occupational Health - Occupational Stress Questionnaire    Feeling of Stress : Only a little  Social Connections: Moderately Integrated (10/21/2019)   Social Connection and Isolation Panel [NHANES]    Frequency of Communication with Friends and Family: More than three times a week    Frequency of Social Gatherings with Friends and Family: More than three times a week    Attends Religious Services: 1 to 4 times per year    Active Member of [employer] or Organizations:  No    Attends Banker Meetings: Never    Marital Status: Married    Family History  Problem Relation Age of Onset   Colon cancer Other        possibly 2 uncles   Stroke Mother    Aneurysm Mother    Hypertension Mother    Heart attack Father    Hypertension Sister    Hypertension Brother    Hypertension Daughter     Outpatient Encounter Medications as of 03/28/2023  Medication Sig   albuterol (VENTOLIN HFA) 108 (90 Base) MCG/ACT inhaler    aspirin 81 MG tablet Take 81 mg by mouth daily.   atorvastatin (LIPITOR) 20 MG tablet Take 1 tablet (20 mg total) by mouth daily.   Continuous Blood Gluc Receiver (FREESTYLE LIBRE 2 READER) DEVI Use to check glucose four times daily as  instructed   Continuous Glucose Sensor (FREESTYLE LIBRE 2 SENSOR) MISC PATIENT NEEDS TO KEEP FOLLOW UP FOR REFILLS.   [employer] D3 125 MCG (5000 UT) capsule TAKE 1 CAPSULE BY MOUTH EVERY DAY   empagliflozin (JARDIANCE) 10 MG TABS tablet Take 1 tablet (10 mg total) by mouth daily before breakfast.   glucose blood (ACCU-CHEK GUIDE) test strip Use as instructed   insulin degludec (TRESIBA FLEXTOUCH) 200 UNIT/ML FlexTouch Pen Inject 50 Units into the skin at bedtime.   olmesartan-hydrochlorothiazide (BENICAR HCT) 40-25 MG tablet Take 1 tablet by mouth daily.   [DISCONTINUED] insulin degludec (TRESIBA FLEXTOUCH) 200 UNIT/ML FlexTouch Pen Inject 70 Units into the skin at bedtime.   No facility-administered encounter medications on file as of 03/28/2023.    ALLERGIES: No Known Allergies  VACCINATION STATUS: Immunization History  Administered Date(s) Administered   Influenza,inj,Quad PF,6+ Mos 03/24/2015   Moderna Sars-Covid-2 Vaccination 06/16/2019, 07/14/2019   Pneumococcal Polysaccharide-23 03/24/2015    Diabetes He presents for his follow-up diabetic visit. He has type 2 diabetes mellitus. Onset time: He was diagnosed at approximate age of 49 years. His disease course has been improving. There are no hypoglycemic associated symptoms. Pertinent negatives for hypoglycemia include no confusion, headaches, pallor or seizures. Pertinent negatives for diabetes include no blurred vision, no chest pain, no fatigue, no polydipsia, no polyphagia, no polyuria and no weakness. There are no hypoglycemic complications. Symptoms are improving. There are no diabetic complications. Risk factors for coronary artery disease include diabetes mellitus, hypertension, family history, male sex, tobacco exposure and dyslipidemia. Current diabetic treatment includes insulin injections. His weight is fluctuating minimally. He is following a generally unhealthy diet. When asked about meal planning, he reported none. He has not  had a previous visit with a dietitian. He rarely participates in exercise. His home blood glucose trend is decreasing steadily. His breakfast blood glucose range is generally 110-130 mg/dl. His lunch blood glucose range is generally 110-130 mg/dl. His dinner blood glucose range is generally 110-130 mg/dl. His bedtime blood glucose range is generally 110-130 mg/dl. His overall blood glucose range is 110-130 mg/dl. (He presents with significant improvement in his glycemic profile.  His AGP report shows 94% in range, 2% Level 1 hyperglycemia.  4% Level One hypoglycemia.   His average blood glucose is 111 mg/day.  His point-of-care A1c 6.7%, progressively improving from 12.9% during last year.    ) An ACE inhibitor/angiotensin II receptor blocker is being taken. Eye exam is not current.  Hypertension This is a chronic problem. The current episode started more than 1 year ago. Pertinent negatives include no blurred vision, chest pain, headaches, neck pain,  palpitations or shortness of breath. Risk factors for coronary artery disease include diabetes mellitus, male gender, smoking/tobacco exposure and family history. Past treatments include angiotensin blockers.     Review of Systems  Constitutional:  Negative for chills, fatigue, fever and unexpected weight change.  HENT:  Negative for dental problem, mouth sores and trouble swallowing.   Eyes:  Negative for blurred vision and visual disturbance.  Respiratory:  Negative for cough, choking, chest tightness, shortness of breath and wheezing.   Cardiovascular:  Negative for chest pain, palpitations and leg swelling.  Gastrointestinal:  Negative for abdominal distention, abdominal pain, constipation, diarrhea, nausea and vomiting.  Endocrine: Negative for polydipsia, polyphagia and polyuria.  Genitourinary:  Negative for dysuria, flank pain, hematuria and urgency.  Musculoskeletal:  Negative for back pain, gait problem, myalgias and neck pain.  Skin:   Negative for pallor, rash and wound.  Neurological:  Negative for seizures, syncope, weakness, numbness and headaches.  Psychiatric/Behavioral:  Negative for confusion and dysphoric mood.     Objective:       03/28/2023    1:22 PM 03/28/2023   12:58 PM 01/02/2023   11:06 AM  Vitals with BMI  Height  5\' 10"    Weight  199 lbs 3 oz   BMI  28.58   Systolic 148 150 161  Diastolic 82 78 90  Pulse  96     BP (!) 148/82 Comment: Pt states he has not taken BP meds today  Pulse 96   Ht 5\' 10"  (1.778 m)   Wt 199 lb 3.2 oz (90.4 kg)   BMI 28.58 kg/m   Wt Readings from Last 3 Encounters:  03/28/23 199 lb 3.2 oz (90.4 kg)  01/02/23 198 lb 3.2 oz (89.9 kg)  12/10/22 199 lb 3.2 oz (90.4 kg)       CMP ( most recent) CMP     Component Value Date/Time   NA 141 03/25/2023 1412   K 4.3 03/25/2023 1412   CL 101 03/25/2023 1412   CO2 22 03/25/2023 1412   GLUCOSE 94 03/25/2023 1412   GLUCOSE 195 (H) 01/15/2022 2325   BUN 14 03/25/2023 1412   CREATININE 1.16 03/25/2023 1412   CREATININE 0.89 10/26/2015 0758   CALCIUM 9.5 03/25/2023 1412   PROT 7.5 03/25/2023 1412   ALBUMIN 4.6 03/25/2023 1412   AST 29 03/25/2023 1412   ALT 29 03/25/2023 1412   ALKPHOS 71 03/25/2023 1412   BILITOT 0.7 03/25/2023 1412   GFRNONAA >60 01/15/2022 2325   GFRAA 70 10/21/2019 0000     Diabetic Labs (most recent): Lab Results  Component Value Date   HGBA1C 6.7 03/28/2023   HGBA1C 7.6 (A) 12/10/2022   HGBA1C 7.8 (A) 08/08/2022   MICROALBUR 150 12/10/2022   MICROALBUR 0.5 10/26/2015     Lipid Panel ( most recent) Lipid Panel     Component Value Date/Time   CHOL 169 03/25/2023 1412   TRIG 116 03/25/2023 1412   HDL 36 (L) 03/25/2023 1412   CHOLHDL 4.7 03/25/2023 1412   CHOLHDL 3.0 10/26/2015 0758   VLDL 16 10/26/2015 0758   LDLCALC 112 (H) 03/25/2023 1412   LABVLDL 21 03/25/2023 1412      Lab Results  Component Value Date   TSH 2.820 03/25/2023   TSH 1.600 11/14/2021   TSH 1.510  11/08/2020   TSH 1.62 10/21/2019   TSH 1.23 10/26/2015   FREET4 0.99 03/25/2023   FREET4 1.20 11/14/2021   FREET4 1.13 11/08/2020   FREET4 1.1 10/26/2015  Assessment & Plan:   1. Uncontrolled type 2 diabetes mellitus with urine microalbuminemia    - Travis Sharp has currently uncontrolled symptomatic type 2 DM since  67 years of age.  He presents with significant improvement in his glycemic profile.  His AGP report shows 94% in range, 2% Level 1 hyperglycemia.  4% Level One hypoglycemia.   His average blood glucose is 111 mg/day.  His point-of-care A1c 6.7%, progressively improving from 12.9% during last year.   Recent labs reviewed.  - I had a long discussion with him about the progressive nature of diabetes and the pathology behind its complications. -his diabetes is complicated by nonfollow-up and he remains at a high risk for more acute and chronic complications which include CAD, CVA, CKD, retinopathy, and neuropathy. These are all discussed in detail with him.  - I have counseled him on diet  and weight management  by adopting a carbohydrate restricted/protein rich diet. Patient is encouraged to switch to  unprocessed or minimally processed     complex starch and increased protein intake (animal or plant source), fruits, and vegetables. -  he is advised to stick to a routine mealtimes to eat 3 meals  a day and avoid unnecessary snacks ( to snack only to correct hypoglycemia).   This patient is benefiting from lifestyle medicine.  - he acknowledges that there is a room for improvement in his food and drink choices. - Suggestion is made for him to avoid simple carbohydrates  from his diet including Cakes, Sweet Desserts, Ice Cream, Soda (diet and regular), Sweet Tea, Candies, Chips, Cookies, Store Bought Juices, Alcohol , Artificial Sweeteners,  Coffee Creamer, and "Sugar-free" Products, Lemonade. This will help patient to have more stable blood glucose profile and potentially  avoid unintended weight gain.  The following Lifestyle Medicine recommendations according to American College of Lifestyle Medicine  [facility]) were discussed and and offered to patient and he  agrees to start the journey:  A. Whole Foods, Plant-Based Nutrition comprising of fruits and vegetables, plant-based proteins, whole-grain carbohydrates was discussed in detail with the patient.   A list for source of those nutrients were also provided to the patient.  Patient will use only water or unsweetened tea for hydration. B.  The need to stay away from risky substances including alcohol, smoking; obtaining 7 to 9 hours of restorative sleep, at least 150 minutes of moderate intensity exercise weekly, the importance of healthy social connections,  and stress management techniques were discussed. C.  A full color page of  Calorie density of various food groups per pound showing examples of each food groups was provided to the patient.    - he will be scheduled with Norm Salt, RDN, CDE for diabetes education.  - I have approached him with the following individualized plan to manage  his diabetes and patient agrees:  -Patient presents with continued overall improvement in his glycemic profile.   -He is advised to lower his [place] to 50 units nightly, associated with continuous utility of his CGM. - he is encouraged to call clinic for blood glucose levels less than 70 or above 200 mg /dl. He does not tolerate metformin.  He will continue to benefit from SGLT2 inhibitors.  I advised him to continue Jardiance 10 mg p.o. daily at breakfast.    Side effects and precautions discussed with him.   - Specific targets for  A1c;  LDL, HDL,  and Triglycerides were discussed with the patient.  2) Blood  Pressure /Hypertension:  His blood pressure is not controlled to target.  he is advised to continue his current medications including Benicar 40/25 mg p.o. daily with breakfast .  3) Lipids/Hyperlipidemia:    Review of his recent lipid panel showed uncontrolled LDL at 112.  He hesitates to add statins at this time.   Whole food plant-based diet will help him manage lipids.  If LDL remains above 70 mg/DL, he will be considered for low-dose statins next visit.      4)  Weight/Diet:  Body mass index is 28.58 kg/m.  -      he is not a candidate for major weight loss.    Exercise, and detailed carbohydrates information provided  -  detailed on discharge instructions.  5) Chronic Care/Health Maintenance:  -he  is on ARB medications and  is encouraged to initiate and continue to follow up with Ophthalmology, Dentist, referred to ophthalmology, podiatrist at least yearly or according to recommendations, and advised to   stay away from smoking. I have recommended yearly flu vaccine and pneumonia vaccine at least every 5 years; moderate intensity exercise for up to 150 minutes weekly; and  sleep for at least 7 hours a day.  - he is  advised to maintain close follow up with Assunta Found, MD for primary care needs, as well as his other providers for optimal and coordinated care.   I spent  28  minutes in the care of the patient today including review of labs from CMP, Lipids, Thyroid Function, Hematology (current and previous including abstractions from other facilities); face-to-face time discussing  his blood glucose readings/logs, discussing hypoglycemia and hyperglycemia episodes and symptoms, medications doses, his options of short and long term treatment based on the latest standards of care / guidelines;  discussion about incorporating lifestyle medicine;  and documenting the encounter. Risk reduction counseling performed per USPSTF guidelines to reduce  cardiovascular risk factors.     Please refer to Patient Instructions for Blood Glucose Monitoring and Insulin/Medications Dosing Guide"  in media tab for additional information. Please  also refer to " Patient Self Inventory" in the Media  tab for reviewed  elements of pertinent patient history.  Travis Sharp participated in the discussions, expressed understanding, and voiced agreement with the above plans.  All questions were answered to his satisfaction. he is encouraged to contact clinic should he have any questions or concerns prior to his return visit.   Follow up plan: - Return in about 4 months (around 07/27/2023) for Bring Meter/CGM Device/Logs- A1c in Office.  Marquis Lunch, MD [facility] [address] [place], [place] [address] Phone: [phone]  Fax: [phone]    03/28/2023, 2:10 PM  This note was partially dictated with voice recognition software. Similar sounding words can be transcribed inadequately or may not  be corrected upon review.

## 2023-04-25 DIAGNOSIS — E113211 Type 2 diabetes mellitus with mild nonproliferative diabetic retinopathy with macular edema, right eye: Secondary | ICD-10-CM | POA: Diagnosis not present

## 2023-05-09 ENCOUNTER — Ambulatory Visit: Payer: PPO | Admitting: Podiatry

## 2023-05-09 ENCOUNTER — Encounter: Payer: Self-pay | Admitting: Podiatry

## 2023-05-09 VITALS — Ht 70.0 in | Wt 199.2 lb

## 2023-05-09 DIAGNOSIS — B351 Tinea unguium: Secondary | ICD-10-CM

## 2023-05-09 DIAGNOSIS — M79674 Pain in right toe(s): Secondary | ICD-10-CM

## 2023-05-09 DIAGNOSIS — E1159 Type 2 diabetes mellitus with other circulatory complications: Secondary | ICD-10-CM | POA: Diagnosis not present

## 2023-05-09 DIAGNOSIS — M79675 Pain in left toe(s): Secondary | ICD-10-CM

## 2023-05-14 DIAGNOSIS — M13842 Other specified arthritis, left hand: Secondary | ICD-10-CM | POA: Diagnosis not present

## 2023-05-14 DIAGNOSIS — M79641 Pain in right hand: Secondary | ICD-10-CM | POA: Insufficient documentation

## 2023-05-14 DIAGNOSIS — M13841 Other specified arthritis, right hand: Secondary | ICD-10-CM | POA: Diagnosis not present

## 2023-05-15 DIAGNOSIS — M13841 Other specified arthritis, right hand: Secondary | ICD-10-CM | POA: Insufficient documentation

## 2023-05-16 NOTE — Progress Notes (Signed)
  Subjective:  Patient ID: Travis Sharp, male    DOB: [birth year],  MRN: [id number]  68 y.o. male presents preventative diabetic foot care and painful, elongated thickened toenails x 10 which are symptomatic when wearing enclosed shoe gear. This interferes with his/her daily activities.  Chief Complaint  Patient presents with   Nail Problem    Pt is here for [facility] last A1C was 7 PCP is Dr Phillips Odor and LOV was DURING THE SUM[facility].   New problem(s): None   PCP is Assunta Found, MD.  No Known Allergies  Review of Systems: Negative except as noted in the HPI.   Objective:  ALEXSANDRO SALEK is a pleasant 68 y.o. male WD, WN in NAD. AAO x 3.  Vascular Examination: Vascular status intact b/l with palpable pedal pulses. CFT immediate b/l. Pedal hair present. No edema. No pain with calf compression b/l. Skin temperature gradient WNL b/l. No varicosities noted. No cyanosis or clubbing noted.  Neurological Examination: Sensation grossly intact b/l with 10 gram monofilament. Vibratory sensation intact b/l.  Dermatological Examination: Pedal skin with normal turgor, texture and tone b/l. No open wounds nor interdigital macerations noted. Toenails 1-5 b/l thick, discolored, elongated with subungual debris and pain on dorsal palpation. No hyperkeratotic lesions noted b/l.   Musculoskeletal Examination: Muscle strength 5/5 to b/l LE.  No pain, crepitus noted b/l. No gross pedal deformities. Patient ambulates independently without assistive aids.   Radiographs: None  Last A1c:      Latest Ref Rng & Units 03/28/2023    1:09 PM 12/10/2022   11:51 AM 08/08/2022   10:07 AM  Hemoglobin A1C  Hemoglobin-A1c 0.0 - 7.0 % 6.7  7.6  7.8      Assessment:   1. Pain due to onychomycosis of toenails of both feet   2. Type 2 diabetes mellitus with vascular disease (HCC)    Plan:  Patient was evaluated and treated. All patient's and/or POA's questions/concerns addressed on today's visit. Mycotic toenails 1-5  debrided in length and girth without incident. Continue soft, supportive shoe gear daily. Report any pedal injuries to medical professional. Call office if there are any quesitons/concerns. -Continue foot and shoe inspections daily. Monitor blood glucose per PCP/Endocrinologist's recommendations. -Patient/POA to call should there be question/concern in the interim.  Return in about 3 months (around 08/07/2023).  Freddie Breech, DPM      [facility] LOCATION: [address], [place] [address]                   Office [phone]   [facility] LOCATION: [address] [place], [place] [address] Office [phone]

## 2023-05-23 DIAGNOSIS — H43822 Vitreomacular adhesion, left eye: Secondary | ICD-10-CM | POA: Diagnosis not present

## 2023-05-23 DIAGNOSIS — H43811 Vitreous degeneration, right eye: Secondary | ICD-10-CM | POA: Diagnosis not present

## 2023-05-23 DIAGNOSIS — H25813 Combined forms of age-related cataract, bilateral: Secondary | ICD-10-CM | POA: Diagnosis not present

## 2023-05-23 DIAGNOSIS — E113211 Type 2 diabetes mellitus with mild nonproliferative diabetic retinopathy with macular edema, right eye: Secondary | ICD-10-CM | POA: Diagnosis not present

## 2023-06-20 DIAGNOSIS — H43811 Vitreous degeneration, right eye: Secondary | ICD-10-CM | POA: Diagnosis not present

## 2023-06-25 DIAGNOSIS — H2513 Age-related nuclear cataract, bilateral: Secondary | ICD-10-CM | POA: Diagnosis not present

## 2023-06-25 DIAGNOSIS — E113291 Type 2 diabetes mellitus with mild nonproliferative diabetic retinopathy without macular edema, right eye: Secondary | ICD-10-CM | POA: Diagnosis not present

## 2023-06-25 DIAGNOSIS — H43822 Vitreomacular adhesion, left eye: Secondary | ICD-10-CM | POA: Diagnosis not present

## 2023-06-25 DIAGNOSIS — H401131 Primary open-angle glaucoma, bilateral, mild stage: Secondary | ICD-10-CM | POA: Diagnosis not present

## 2023-06-25 DIAGNOSIS — H2511 Age-related nuclear cataract, right eye: Secondary | ICD-10-CM | POA: Diagnosis not present

## 2023-06-25 DIAGNOSIS — H25013 Cortical age-related cataract, bilateral: Secondary | ICD-10-CM | POA: Diagnosis not present

## 2023-06-25 DIAGNOSIS — H18413 Arcus senilis, bilateral: Secondary | ICD-10-CM | POA: Diagnosis not present

## 2023-06-25 DIAGNOSIS — H25043 Posterior subcapsular polar age-related cataract, bilateral: Secondary | ICD-10-CM | POA: Diagnosis not present

## 2023-06-27 DIAGNOSIS — K2100 Gastro-esophageal reflux disease with esophagitis, without bleeding: Secondary | ICD-10-CM | POA: Diagnosis not present

## 2023-06-27 DIAGNOSIS — R053 Chronic cough: Secondary | ICD-10-CM | POA: Diagnosis not present

## 2023-06-27 DIAGNOSIS — E782 Mixed hyperlipidemia: Secondary | ICD-10-CM | POA: Diagnosis not present

## 2023-06-27 DIAGNOSIS — Z1331 Encounter for screening for depression: Secondary | ICD-10-CM | POA: Diagnosis not present

## 2023-06-27 DIAGNOSIS — R1310 Dysphagia, unspecified: Secondary | ICD-10-CM | POA: Diagnosis not present

## 2023-06-27 DIAGNOSIS — E6609 Other obesity due to excess calories: Secondary | ICD-10-CM | POA: Diagnosis not present

## 2023-06-27 DIAGNOSIS — Z125 Encounter for screening for malignant neoplasm of prostate: Secondary | ICD-10-CM | POA: Diagnosis not present

## 2023-06-27 DIAGNOSIS — Z0001 Encounter for general adult medical examination with abnormal findings: Secondary | ICD-10-CM | POA: Diagnosis not present

## 2023-06-27 DIAGNOSIS — Z6837 Body mass index (BMI) 37.0-37.9, adult: Secondary | ICD-10-CM | POA: Diagnosis not present

## 2023-06-27 DIAGNOSIS — E1140 Type 2 diabetes mellitus with diabetic neuropathy, unspecified: Secondary | ICD-10-CM | POA: Diagnosis not present

## 2023-06-27 DIAGNOSIS — I152 Hypertension secondary to endocrine disorders: Secondary | ICD-10-CM | POA: Diagnosis not present

## 2023-06-27 DIAGNOSIS — E1165 Type 2 diabetes mellitus with hyperglycemia: Secondary | ICD-10-CM | POA: Diagnosis not present

## 2023-07-01 ENCOUNTER — Other Ambulatory Visit (HOSPITAL_COMMUNITY): Payer: Self-pay | Admitting: Family Medicine

## 2023-07-01 ENCOUNTER — Ambulatory Visit (HOSPITAL_COMMUNITY)
Admission: RE | Admit: 2023-07-01 | Discharge: 2023-07-01 | Disposition: A | Discharge status: Home / Self Care | Source: Ambulatory Visit | Attending: Family Medicine | Admitting: Family Medicine

## 2023-07-01 DIAGNOSIS — R053 Chronic cough: Secondary | ICD-10-CM | POA: Insufficient documentation

## 2023-07-01 DIAGNOSIS — R059 Cough, unspecified: Secondary | ICD-10-CM | POA: Diagnosis not present

## 2023-07-01 DIAGNOSIS — I10 Essential (primary) hypertension: Secondary | ICD-10-CM | POA: Diagnosis not present

## 2023-07-08 ENCOUNTER — Encounter: Payer: Self-pay | Admitting: *Deleted

## 2023-07-10 ENCOUNTER — Encounter: Payer: Self-pay | Admitting: Cardiology

## 2023-07-10 ENCOUNTER — Ambulatory Visit: Payer: BC Managed Care – PPO | Attending: Cardiology | Admitting: Cardiology

## 2023-07-10 VITALS — BP 124/84 | HR 74 | Ht 70.0 in | Wt 195.4 lb

## 2023-07-10 DIAGNOSIS — E782 Mixed hyperlipidemia: Secondary | ICD-10-CM

## 2023-07-10 DIAGNOSIS — R0602 Shortness of breath: Secondary | ICD-10-CM

## 2023-07-10 DIAGNOSIS — I10 Essential (primary) hypertension: Secondary | ICD-10-CM

## 2023-07-10 NOTE — Patient Instructions (Signed)
 Medication Instructions:  Continue all current medications.   Labwork: none  Testing/Procedures: none  Follow-Up: 6 months   Any Other Special Instructions Will Be Listed Below (If Applicable).   If you need a refill on your cardiac medications before your next appointment, please call your pharmacy.

## 2023-07-10 NOTE — Progress Notes (Signed)
 Clinical Summary Travis Sharp is a 68 y.o.male seen today for follow up of the following medical problems.   1. SOB - 2017 completed an echo stress echo that were both overall unremarkable.  - last visit reported some episodes of waking up middle of night gasping for air. We had discussed a sleep study but he was not in favor. Since last visit these symptoms have resolved.   - denies any SOB/DOE - no recent chest pains.      2. DM2  -03/2023 HgbA1c 6.7   3. HTN  - he is compliant with meds    4.HLD -03/2023 TC 169 TG 116 HDL 36 LDL 112 - not in favor of adjusting medications at this time, working on aggressive dietary changes       SH: son just graduated [employer].  Past Medical History:  Diagnosis Date   Colitis 03/23/2015   Diabetes mellitus    Gastritis    Hypertension      No Known Allergies   Current Outpatient Medications  Medication Sig Dispense Refill   albuterol (VENTOLIN HFA) 108 (90 Base) MCG/ACT inhaler      aspirin 81 MG tablet Take 81 mg by mouth daily.     atorvastatin (LIPITOR) 20 MG tablet Take 1 tablet (20 mg total) by mouth daily. 90 tablet 2   Continuous Blood Gluc Receiver (FREESTYLE LIBRE 2 READER) DEVI Use to check glucose four times daily as instructed 1 each 0   Continuous Glucose Sensor (FREESTYLE LIBRE 2 SENSOR) MISC PATIENT NEEDS TO KEEP FOLLOW UP FOR REFILLS. 6 each 2   [facility]S D3 125 MCG (5000 UT) capsule TAKE 1 CAPSULE BY MOUTH EVERY DAY 100 capsule 0   empagliflozin (JARDIANCE) 10 MG TABS tablet Take 1 tablet (10 mg total) by mouth daily before breakfast. 90 tablet 1   glucose blood (ACCU-CHEK GUIDE) test strip Use as instructed 150 each 2   insulin degludec (TRESIBA FLEXTOUCH) 200 UNIT/ML FlexTouch Pen Inject 50 Units into the skin at bedtime. 12 mL 1   olmesartan-hydrochlorothiazide (BENICAR HCT) 40-25 MG tablet Take 1 tablet by mouth daily.     No current facility-administered medications for this visit.     Past  Surgical History:  Procedure Laterality Date   BIOPSY  05/10/2015   Procedure: BIOPSY;  Surgeon: West Bali, MD;  [facility];  Service: Endoscopy;;  gastric bx's   COLONOSCOPY WITH PROPOFOL N/A 05/10/2015   dr. Darrick Penna: Mild diverticulosis, hemorrhoids.  Next colonoscopy 10 years   ESOPHAGOGASTRODUODENOSCOPY (EGD) WITH PROPOFOL N/A 05/10/2015   Dr. Darrick Penna: dysphagai due to stricture at [facility] s/p dilation, mild nonerosive gastritis.    SAVORY DILATION N/A 05/10/2015   Procedure: SAVORY DILATION;  Surgeon: West Bali, MD;  [facility]: [facility];  Service: Endoscopy;  Laterality: N/A;   TONSILLECTOMY       No Known Allergies    Family History  Problem Relation Age of Onset   Colon cancer Other        possibly 2 uncles   Stroke Mother    Aneurysm Mother    Hypertension Mother    Heart attack Father    Hypertension Sister    Hypertension Brother    Hypertension Daughter      Social History Mr. Mizuno reports that he quit smoking about 18 years ago. His smoking use included cigarettes. He started smoking about 46 years ago. He has a 28 pack-year smoking history. He  has never used smokeless tobacco. Mr. Cloe reports that he does not currently use alcohol.     Physical Examination Today's Vitals   07/10/23 0844  BP: 124/84  Pulse: 74  SpO2: 99%  Weight: 195 lb 6.4 oz (88.6 kg)  Height: 5\' 10"  (1.778 m)   Body mass index is 28.04 kg/m.  Gen: resting comfortably, no acute distress HEENT: no scleral icterus, pupils equal round and reactive, no palptable cervical adenopathy,  CV: RRR, no m/rg, no jvd Resp: Clear to auscultation bilaterally GI: abdomen is soft, non-tender, non-distended, normal bowel sounds, no hepatosplenomegaly MSK: extremities are warm, no edema.  Skin: warm, no rash Neuro:  no focal deficits Psych: appropriate affect   Diagnostic Studies  10/2015 echo Study Conclusions   - Left ventricle: The cavity size was normal. Wall  thickness was   increased in a pattern of mild LVH. Systolic function was normal.   The estimated ejection fraction was in the range of 55% to 60%.   Wall motion was normal; there were no regional wall motion   abnormalities. Doppler parameters are consistent with abnormal   left ventricular relaxation (grade 1 diastolic dysfunction). - Aortic valve: Valve area (VTI): 2.74 cm^2. Valve area (Vmax):   3.19 cm^2. Valve area (Vmean): 2.76 cm^2.   10/2015 stress echo Study Conclusions   - Stress ECG conclusions: The stress ECG was normal. [facility] scoring:   exercise time of 8 min; maximum ST deviation of 0 mm; no angina;   resulting score is 8. This score predicts a low risk of cardiac   events. - Staged echo: Normal echo stress   Impressions:   - Normal study after maximal exercise.   Assessment and Plan    1. SOB - Prior cardiac testing benign in 2017 - no recent symptoms, continue to monitor   2. HLD -- not in favor of adjusting medications at this time, working on aggressive dietary changes - continue atorvastatin 20mg  daily, follow lipids over time.    3. HTN - at goal, continue current meds   F/u 6 months, if lipids at goal can see just once a year.     Antoine Poche, M.D.

## 2023-07-18 DIAGNOSIS — E113211 Type 2 diabetes mellitus with mild nonproliferative diabetic retinopathy with macular edema, right eye: Secondary | ICD-10-CM | POA: Diagnosis not present

## 2023-07-18 DIAGNOSIS — H43811 Vitreous degeneration, right eye: Secondary | ICD-10-CM | POA: Diagnosis not present

## 2023-07-18 DIAGNOSIS — H43822 Vitreomacular adhesion, left eye: Secondary | ICD-10-CM | POA: Diagnosis not present

## 2023-07-18 DIAGNOSIS — H25813 Combined forms of age-related cataract, bilateral: Secondary | ICD-10-CM | POA: Diagnosis not present

## 2023-07-24 DIAGNOSIS — K219 Gastro-esophageal reflux disease without esophagitis: Secondary | ICD-10-CM | POA: Diagnosis not present

## 2023-07-24 DIAGNOSIS — R1310 Dysphagia, unspecified: Secondary | ICD-10-CM | POA: Diagnosis not present

## 2023-07-29 ENCOUNTER — Ambulatory Visit: Payer: PPO | Admitting: "Endocrinology

## 2023-08-08 ENCOUNTER — Ambulatory Visit (INDEPENDENT_AMBULATORY_CARE_PROVIDER_SITE_OTHER): Payer: PPO | Admitting: Podiatry

## 2023-08-08 DIAGNOSIS — Z91199 Patient's noncompliance with other medical treatment and regimen due to unspecified reason: Secondary | ICD-10-CM

## 2023-08-08 NOTE — Progress Notes (Signed)
 1. No-show for appointment   No show #1.

## 2023-08-12 DIAGNOSIS — H2511 Age-related nuclear cataract, right eye: Secondary | ICD-10-CM | POA: Diagnosis not present

## 2023-08-13 DIAGNOSIS — H2512 Age-related nuclear cataract, left eye: Secondary | ICD-10-CM | POA: Diagnosis not present

## 2023-08-26 DIAGNOSIS — H2512 Age-related nuclear cataract, left eye: Secondary | ICD-10-CM | POA: Diagnosis not present

## 2023-08-26 DIAGNOSIS — H25012 Cortical age-related cataract, left eye: Secondary | ICD-10-CM | POA: Diagnosis not present

## 2023-08-26 DIAGNOSIS — H25042 Posterior subcapsular polar age-related cataract, left eye: Secondary | ICD-10-CM | POA: Diagnosis not present

## 2023-08-27 ENCOUNTER — Ambulatory Visit: Admitting: "Endocrinology

## 2023-09-04 ENCOUNTER — Ambulatory Visit: Admitting: "Endocrinology

## 2023-09-04 ENCOUNTER — Encounter: Payer: Self-pay | Admitting: "Endocrinology

## 2023-09-04 ENCOUNTER — Other Ambulatory Visit: Payer: Self-pay | Admitting: "Endocrinology

## 2023-09-04 DIAGNOSIS — K224 Dyskinesia of esophagus: Secondary | ICD-10-CM | POA: Diagnosis not present

## 2023-09-04 DIAGNOSIS — K3189 Other diseases of stomach and duodenum: Secondary | ICD-10-CM | POA: Diagnosis not present

## 2023-09-04 DIAGNOSIS — R1310 Dysphagia, unspecified: Secondary | ICD-10-CM | POA: Diagnosis not present

## 2023-09-04 DIAGNOSIS — K2930 Chronic superficial gastritis without bleeding: Secondary | ICD-10-CM | POA: Diagnosis not present

## 2023-09-04 DIAGNOSIS — K2980 Duodenitis without bleeding: Secondary | ICD-10-CM | POA: Diagnosis not present

## 2023-09-10 DIAGNOSIS — K2930 Chronic superficial gastritis without bleeding: Secondary | ICD-10-CM | POA: Diagnosis not present

## 2023-09-10 DIAGNOSIS — K2980 Duodenitis without bleeding: Secondary | ICD-10-CM | POA: Diagnosis not present

## 2023-09-15 HISTORY — PX: CATARACT EXTRACTION: SUR2

## 2023-09-18 DIAGNOSIS — H43811 Vitreous degeneration, right eye: Secondary | ICD-10-CM | POA: Diagnosis not present

## 2023-09-18 DIAGNOSIS — H25813 Combined forms of age-related cataract, bilateral: Secondary | ICD-10-CM | POA: Diagnosis not present

## 2023-09-18 DIAGNOSIS — E113211 Type 2 diabetes mellitus with mild nonproliferative diabetic retinopathy with macular edema, right eye: Secondary | ICD-10-CM | POA: Diagnosis not present

## 2023-09-18 DIAGNOSIS — H43822 Vitreomacular adhesion, left eye: Secondary | ICD-10-CM | POA: Diagnosis not present

## 2023-10-01 ENCOUNTER — Other Ambulatory Visit: Payer: Self-pay | Admitting: "Endocrinology

## 2023-10-14 ENCOUNTER — Ambulatory Visit (INDEPENDENT_AMBULATORY_CARE_PROVIDER_SITE_OTHER): Payer: Self-pay | Admitting: Podiatry

## 2023-10-14 DIAGNOSIS — Z91199 Patient's noncompliance with other medical treatment and regimen due to unspecified reason: Secondary | ICD-10-CM

## 2023-10-14 NOTE — Progress Notes (Signed)
 1. No-show for appointment

## 2023-10-27 ENCOUNTER — Other Ambulatory Visit: Payer: Self-pay | Admitting: "Endocrinology

## 2023-10-28 ENCOUNTER — Ambulatory Visit: Admitting: Podiatry

## 2023-10-28 ENCOUNTER — Encounter: Payer: Self-pay | Admitting: Podiatry

## 2023-10-28 DIAGNOSIS — M79675 Pain in left toe(s): Secondary | ICD-10-CM

## 2023-10-28 DIAGNOSIS — E1159 Type 2 diabetes mellitus with other circulatory complications: Secondary | ICD-10-CM | POA: Diagnosis not present

## 2023-10-28 DIAGNOSIS — B351 Tinea unguium: Secondary | ICD-10-CM | POA: Diagnosis not present

## 2023-10-28 DIAGNOSIS — M2012 Hallux valgus (acquired), left foot: Secondary | ICD-10-CM | POA: Diagnosis not present

## 2023-10-28 DIAGNOSIS — M2011 Hallux valgus (acquired), right foot: Secondary | ICD-10-CM | POA: Diagnosis not present

## 2023-10-28 DIAGNOSIS — E119 Type 2 diabetes mellitus without complications: Secondary | ICD-10-CM

## 2023-10-28 DIAGNOSIS — M79674 Pain in right toe(s): Secondary | ICD-10-CM

## 2023-10-28 NOTE — Progress Notes (Signed)
 ANNUAL DIABETIC FOOT EXAM  Subjective: Travis Sharp presents today for annual diabetic foot exam.  Chief Complaint  Patient presents with   Nail Problem    Thick painful toenails   Patient confirms h/o diabetes.  Patient denies any h/o foot wounds.  [name], MD is patient's PCP. LOV 06/27/2023.  Past Medical History:  Diagnosis Date   Colitis 03/23/2015   Diabetes mellitus    Gastritis    Hypertension    Patient Active Problem List   Diagnosis Date Noted   Other specified arthritis, right hand 05/15/2023   Pain in both hands 05/14/2023   Insulin  long-term use (HCC) 12/10/2022   Urine test positive for microalbuminuria 12/10/2022   Non-adherence to medical treatment 05/08/2021   Vitamin D  deficiency 11/17/2020   Uncontrolled type 2 diabetes mellitus with hyperglycemia (HCC) 12/03/2019   Mixed hyperlipidemia 12/03/2019   Constipation 10/03/2015   Nausea with vomiting 10/03/2015   Loss of weight 07/15/2015   Intractable nausea and vomiting 05/18/2015   Dysphagia 04/26/2015   GERD (gastroesophageal reflux disease) 04/26/2015   Hypokalemia 03/25/2015   Type 2 diabetes mellitus with vascular disease (HCC) 03/24/2015   Colitis 03/23/2015   Nausea and vomiting 03/23/2015   Essential hypertension, benign 03/23/2015   RECTAL BLEEDING 09/29/2008   Past Surgical History:  Procedure Laterality Date   BIOPSY  05/10/2015   Procedure: BIOPSY;  Surgeon: Margo CROME Haddock, MD;  [facility];  Service: Endoscopy;;  gastric bx's   COLONOSCOPY WITH PROPOFOL  N/A 05/10/2015   dr. Haddock: Mild diverticulosis, hemorrhoids.  Next colonoscopy 10 years   ESOPHAGOGASTRODUODENOSCOPY (EGD) WITH PROPOFOL  N/A 05/10/2015   Dr. Haddock: dysphagai due to stricture at [facility] s/p dilation, mild nonerosive gastritis.    SAVORY DILATION N/A 05/10/2015   Procedure: SAVORY DILATION;  Surgeon: Margo CROME Haddock, MD;  [facility];  Service: Endoscopy;  Laterality: N/A;   TONSILLECTOMY      Current Outpatient Medications on File Prior to Visit  Medication Sig Dispense Refill   albuterol  (VENTOLIN  HFA) 108 (90 Base) MCG/ACT inhaler      aspirin 81 MG tablet Take 81 mg by mouth daily.     atorvastatin  (LIPITOR) 20 MG tablet Take 1 tablet (20 mg total) by mouth daily. 90 tablet 2   brimonidine (ALPHAGAN) 0.2 % ophthalmic solution Place 1 drop into both eyes 3 (three) times daily.     Continuous Blood Gluc Receiver (FREESTYLE LIBRE 2 READER) DEVI Use to check glucose four times daily as instructed 1 each 0   Continuous Glucose Sensor (FREESTYLE LIBRE 2 SENSOR) MISC PATIENT NEEDS TO KEEP FOLLOW UP FOR REFILLS. 6 each 2   CVS D3 125 MCG (5000 UT) capsule TAKE 1 CAPSULE BY MOUTH EVERY DAY 100 capsule 0   gatifloxacin (ZYMAXID) 0.5 % SOLN Place 1 drop into both eyes.     glucose blood (ACCU-CHEK GUIDE) test strip Use as instructed 150 each 2   insulin  degludec (TRESIBA  FLEXTOUCH) 200 UNIT/ML FlexTouch Pen Inject 50 Units into the skin at bedtime. 12 mL 1   JARDIANCE  10 MG TABS tablet TAKE 1 TABLET BY MOUTH EVERY DAY BEFORE BREAKFAST 30 tablet 0   meloxicam (MOBIC) 15 MG tablet Take 15 mg by mouth daily.     olmesartan -hydrochlorothiazide  (BENICAR  HCT) 40-25 MG tablet Take 1 tablet by mouth daily.     No current facility-administered medications on file prior to visit.    No Known Allergies Social History   Occupational History  Occupation: [employer]  Tobacco Use   Smoking status: Former    Current packs/day: 0.00    Average packs/day: 1 pack/day for 28.0 years (28.0 ttl pk-yrs)    Types: Cigarettes    Start date: 05/04/1977    Quit date: 05/04/2005    Years since quitting: 18.4   Smokeless tobacco: Never  Substance and Sexual Activity   Alcohol use: Not Currently    Alcohol/week: 0.0 standard drinks of alcohol    Comment: very seldom   Drug use: Not Currently    Types: Marijuana    Comment: denied 10/28/19   Sexual activity: Not on file   Family History  Problem  Relation Age of Onset   Colon cancer Other        possibly 2 uncles   Stroke Mother    Aneurysm Mother    Hypertension Mother    Heart attack Father    Hypertension Sister    Hypertension Brother    Hypertension Daughter    Immunization History  Administered Date(s) Administered   Influenza,inj,Quad PF,6+ Mos 03/24/2015   Moderna Sars-Covid-2 Vaccination 06/16/2019, 07/14/2019   Pneumococcal Polysaccharide-23 03/24/2015     Review of Systems: Negative except as noted in the HPI.   Objective: There were no vitals filed for this visit.  Travis Sharp is a pleasant 68 y.o. male in NAD. AAO X 3.  Diabetic foot exam was performed with the following findings:   No deformities, ulcerations, or other skin breakdown Normal sensation of 10g monofilament Intact posterior tibialis and dorsalis pedis pulses Vascular Examination: Capillary refill time immediate b/l. Palpable pedal pulses. Pedal hair present b/l. No pain with calf compression b/l. Skin temperature gradient WNL b/l. No cyanosis or clubbing b/l. No ischemia or gangrene noted b/l. No edema noted b/l LE.  Neurological Examination: Sensation grossly intact b/l with 10 gram monofilament.   Dermatological Examination: Pedal skin with normal turgor, texture and tone b/l.  No open wounds. No interdigital macerations.   Toenails 1-5 b/l thick, discolored, elongated with subungual debris and pain on dorsal palpation.   There is evidence of subacute subungual hematoma of the left great toe and left third digit. Nailplate remains adhered. There is no  tenderness to palpation. No hyperkeratotic nor porokeratotic lesions present on today's visit.   Musculoskeletal Examination: Muscle strength 5/5 to all lower extremity muscle groups bilaterally. HAV with bunion deformity noted b/l LE.SABRA No pain, crepitus or joint limitation noted with ROM b/l LE.  Patient ambulates independently without assistive aids.  Radiographs: None      Lab  Results  Component Value Date   HGBA1C 6.7 03/28/2023   ADA Risk Categorization: Low Risk :  Patient has all of the following: Intact protective sensation No prior foot ulcer  No severe deformity Pedal pulses present  Assessment: 1. Pain due to onychomycosis of toenails of both feet   2. Hallux valgus, acquired, bilateral   3. Type 2 diabetes mellitus with vascular disease (HCC)   4. Encounter for diabetic foot exam (HCC)     Plan: Diabetic foot examination performed today. All patient's and/or POA's questions/concerns addressed on today's visit. Toenails 1-5 debrided in length and girth without incident. Continue foot and shoe inspections daily. Monitor blood glucose per PCP/Endocrinologist's recommendations. Continue soft, supportive shoe gear daily. Report any pedal injuries to medical professional. Call office if there are any questions/concerns. -Patient/POA to call should there be question/concern in the interim. Return in about 3 months (around 01/28/2024).  Delon LITTIE  Gaynel, DPM      [place] LOCATION: [address] [place] [address]                   Office [phone]   [facility] LOCATION: [address] [place], [place] [address] Office [phone]

## 2023-10-30 ENCOUNTER — Ambulatory Visit: Admitting: "Endocrinology

## 2023-10-30 ENCOUNTER — Encounter: Payer: Self-pay | Admitting: "Endocrinology

## 2023-10-30 VITALS — BP 114/78 | HR 96 | Ht 70.0 in | Wt 188.4 lb

## 2023-10-30 DIAGNOSIS — E559 Vitamin D deficiency, unspecified: Secondary | ICD-10-CM | POA: Diagnosis not present

## 2023-10-30 DIAGNOSIS — I10 Essential (primary) hypertension: Secondary | ICD-10-CM

## 2023-10-30 DIAGNOSIS — E1165 Type 2 diabetes mellitus with hyperglycemia: Secondary | ICD-10-CM

## 2023-10-30 DIAGNOSIS — Z794 Long term (current) use of insulin: Secondary | ICD-10-CM

## 2023-10-30 DIAGNOSIS — E782 Mixed hyperlipidemia: Secondary | ICD-10-CM | POA: Diagnosis not present

## 2023-10-30 LAB — POCT GLYCOSYLATED HEMOGLOBIN (HGB A1C): HbA1c, POC (controlled diabetic range): 8.2 % — AB (ref 0.0–7.0)

## 2023-10-30 MED ORDER — FREESTYLE LIBRE 3 PLUS SENSOR MISC: 2 refills | Status: DC

## 2023-10-30 MED ORDER — FREESTYLE LIBRE 3 READER DEVI: 1.0000 | Freq: Once | 0 refills | Status: AC | PRN

## 2023-10-30 NOTE — Progress Notes (Signed)
 10/30/2023, 12:30 PM   Endocrinology follow-up note  Subjective:    Patient ID: Travis Sharp, male    DOB: 10/12/1955.  Travis Sharp is being seen in follow-up after he was seen in consultation for management of currently uncontrolled symptomatic diabetes requested by  [name], MD.   Past Medical History:  Diagnosis Date   Colitis 03/23/2015   Diabetes mellitus    Gastritis    Hypertension     Past Surgical History:  Procedure Laterality Date   BIOPSY  05/10/2015   Procedure: BIOPSY;  Surgeon: Margo LITTIE Haddock, MD;  [facility]: [facility];  Service: Endoscopy;;  gastric bx's   CATARACT EXTRACTION  09/2023   COLONOSCOPY WITH PROPOFOL  N/A 05/10/2015   dr. Haddock: Mild diverticulosis, hemorrhoids.  Next colonoscopy 10 years   ESOPHAGOGASTRODUODENOSCOPY (EGD) WITH PROPOFOL  N/A 05/10/2015   Dr. Haddock: dysphagai due to stricture at [facility] s/p dilation, mild nonerosive gastritis.    SAVORY DILATION N/A 05/10/2015   Procedure: SAVORY DILATION;  Surgeon: Margo LITTIE Haddock, MD;  Location: [facility];  Service: Endoscopy;  Laterality: N/A;   TONSILLECTOMY      Social History   Socioeconomic History   Marital status: Married    Spouse name: Sharlet   Number of children: 3   Years of education: Not on file   Highest education level: Not on file  Occupational History   Occupation: [employer]  Tobacco Use   Smoking status: Former    Current packs/day: 0.00    Average packs/day: 1 pack/day for 28.0 years (28.0 ttl pk-yrs)    Types: Cigarettes    Start date: 05/04/1977    Quit date: 05/04/2005    Years since quitting: 18.5   Smokeless tobacco: Never  Substance and Sexual Activity   Alcohol use: Not Currently    Alcohol/week: 0.0 standard drinks of alcohol    Comment: very seldom   Drug use: Not Currently    Types: Marijuana    Comment: denied 10/28/19   Sexual activity: Not on file  Other Topics Concern   Not on file  Social History  Narrative   Not on file   Social Drivers of Health   Financial Resource Strain: Low Risk  (10/21/2019)   Overall Financial Resource Strain (CARDIA)    Difficulty of Paying Living Expenses: Not hard at all  Food Insecurity: No Food Insecurity (10/21/2019)   Hunger Vital Sign    Worried About Running Out of Food in the Last Year: Never true    Ran Out of Food in the Last Year: Never true  Transportation Needs: No Transportation Needs (10/21/2019)   PRAPARE - Administrator, Civil Service (Medical): No    Lack of Transportation (Non-Medical): No  Physical Activity: Insufficiently Active (10/21/2019)   Exercise Vital Sign    Days of Exercise per Week: 3 days    Minutes of Exercise per Session: 30 min  Stress: No Stress Concern Present (10/21/2019)   [employer] of Occupational Health - Occupational Stress Questionnaire    Feeling of Stress : Only a little  Social Connections: Moderately Integrated (10/21/2019)   Social Connection and Isolation Panel    Frequency of Communication with Friends and Family: More than three times a week    Frequency of Social Gatherings with Friends and Family: More than three times a week    Attends Religious Services: 1 to 4 times per year    Active  Member of Clubs or Organizations: No    Attends Engineer, structural: Never    Marital Status: Married    Family History  Problem Relation Age of Onset   Colon cancer Other        possibly 2 uncles   Stroke Mother    Aneurysm Mother    Hypertension Mother    Heart attack Father    Hypertension Sister    Hypertension Brother    Hypertension Daughter     Outpatient Encounter Medications as of 10/30/2023  Medication Sig   Continuous Glucose Receiver (FREESTYLE LIBRE 3 READER) DEVI 1 Piece by Does not apply route once as needed for up to 1 dose.   Continuous Glucose Sensor (FREESTYLE LIBRE 3 PLUS SENSOR) MISC Change sensor every 15 days.   albuterol  (VENTOLIN  HFA) 108 (90 Base) MCG/ACT  inhaler    aspirin 81 MG tablet Take 81 mg by mouth daily.   atorvastatin  (LIPITOR) 20 MG tablet Take 1 tablet (20 mg total) by mouth daily.   brimonidine (ALPHAGAN) 0.2 % ophthalmic solution Place 1 drop into both eyes 3 (three) times daily.   [employer] D3 125 MCG (5000 UT) capsule TAKE 1 CAPSULE BY MOUTH EVERY DAY   gatifloxacin (ZYMAXID) 0.5 % SOLN Place 1 drop into both eyes.   glucose blood (ACCU-CHEK GUIDE) test strip Use as instructed   insulin  degludec (TRESIBA  FLEXTOUCH) 200 UNIT/ML FlexTouch Pen Inject 50 Units into the skin at bedtime.   JARDIANCE  10 MG TABS tablet TAKE 1 TABLET BY MOUTH EVERY DAY BEFORE BREAKFAST   meloxicam (MOBIC) 15 MG tablet Take 15 mg by mouth daily.   olmesartan -hydrochlorothiazide  (BENICAR  HCT) 40-25 MG tablet Take 1 tablet by mouth daily.   [DISCONTINUED] Continuous Blood Gluc Receiver (FREESTYLE LIBRE 2 READER) DEVI Use to check glucose four times daily as instructed   [DISCONTINUED] Continuous Glucose Sensor (FREESTYLE LIBRE 2 SENSOR) MISC PATIENT NEEDS TO KEEP FOLLOW UP FOR REFILLS.   No facility-administered encounter medications on file as of 10/30/2023.    ALLERGIES: No Known Allergies  VACCINATION STATUS: Immunization History  Administered Date(s) Administered   Influenza,inj,Quad PF,6+ Mos 03/24/2015   Moderna Sars-Covid-2 Vaccination 06/16/2019, 07/14/2019   Pneumococcal Polysaccharide-23 03/24/2015    Diabetes He presents for his follow-up diabetic visit. He has type 2 diabetes mellitus. Onset time: He was diagnosed at approximate age of 49 years. His disease course has been worsening. There are no hypoglycemic associated symptoms. Pertinent negatives for hypoglycemia include no confusion, headaches, pallor or seizures. Pertinent negatives for diabetes include no blurred vision, no chest pain, no fatigue, no polydipsia, no polyphagia, no polyuria and no weakness. There are no hypoglycemic complications. Symptoms are worsening. There are no diabetic  complications. Risk factors for coronary artery disease include diabetes mellitus, hypertension, family history, male sex, tobacco exposure and dyslipidemia. Current diabetic treatment includes insulin  injections. His weight is decreasing steadily. He is following a generally unhealthy diet. When asked about meal planning, he reported none. He has not had a previous visit with a dietitian. He rarely participates in exercise. His home blood glucose trend is increasing steadily. His overall blood glucose range is 140-180 mg/dl. (This patient continues to be an indication for proper treatment and follow-up.   He missed his appointment since December 2024.  He presents with loss of control in his glycemia.  He did not utilize his Libre CGM properly.  Has limited data in the machine showing 78% time range he presents with significant improvement in his  glycemic profile.  His point-of-care A1c is 8.2% increasing from 6.7%.  He stay on his Tresiba  50 units nightly and Jardiance  10 mg p.o. daily.    ) An ACE inhibitor/angiotensin II receptor blocker is being taken. Eye exam is not current.  Hypertension This is a chronic problem. The current episode started more than 1 year ago. Pertinent negatives include no blurred vision, chest pain, headaches, neck pain, palpitations or shortness of breath. Risk factors for coronary artery disease include diabetes mellitus, male gender, smoking/tobacco exposure and family history. Past treatments include angiotensin blockers.    Objective:       10/30/2023   10:59 AM 07/10/2023    8:44 AM 05/09/2023    9:13 AM  Vitals with BMI  Height 5' 10 5' 10 5' 10  Weight 188 lbs 6 oz 195 lbs 6 oz 199 lbs 3 oz  BMI 27.03 28.04 28.58  Systolic 114 124   Diastolic 78 84   Pulse 96 74     BP 114/78   Pulse 96   Ht 5' 10 (1.778 m)   Wt 188 lb 6.4 oz (85.5 kg)   BMI 27.03 kg/m   Wt Readings from Last 3 Encounters:  10/30/23 188 lb 6.4 oz (85.5 kg)  07/10/23 195 lb 6.4  oz (88.6 kg)  05/09/23 199 lb 3.2 oz (90.4 kg)       CMP ( most recent) CMP     Component Value Date/Time   NA 141 03/25/2023 1412   K 4.3 03/25/2023 1412   CL 101 03/25/2023 1412   CO2 22 03/25/2023 1412   GLUCOSE 94 03/25/2023 1412   GLUCOSE 195 (H) 01/15/2022 2325   BUN 14 03/25/2023 1412   CREATININE 1.16 03/25/2023 1412   CREATININE 0.89 10/26/2015 0758   CALCIUM  9.5 03/25/2023 1412   PROT 7.5 03/25/2023 1412   ALBUMIN 4.6 03/25/2023 1412   AST 29 03/25/2023 1412   ALT 29 03/25/2023 1412   ALKPHOS 71 03/25/2023 1412   BILITOT 0.7 03/25/2023 1412   GFRNONAA >60 01/15/2022 2325   GFRAA 70 10/21/2019 0000     Diabetic Labs (most recent): Lab Results  Component Value Date   HGBA1C 8.2 (A) 10/30/2023   HGBA1C 6.7 03/28/2023   HGBA1C 7.6 (A) 12/10/2022   MICROALBUR 150 12/10/2022   MICROALBUR 0.5 10/26/2015     Lipid Panel ( most recent) Lipid Panel     Component Value Date/Time   CHOL 169 03/25/2023 1412   TRIG 116 03/25/2023 1412   HDL 36 (L) 03/25/2023 1412   CHOLHDL 4.7 03/25/2023 1412   CHOLHDL 3.0 10/26/2015 0758   VLDL 16 10/26/2015 0758   LDLCALC 112 (H) 03/25/2023 1412   LABVLDL 21 03/25/2023 1412      Lab Results  Component Value Date   TSH 2.820 03/25/2023   TSH 1.600 11/14/2021   TSH 1.510 11/08/2020   TSH 1.62 10/21/2019   TSH 1.23 10/26/2015   FREET4 0.99 03/25/2023   FREET4 1.20 11/14/2021   FREET4 1.13 11/08/2020   FREET4 1.1 10/26/2015     Assessment & Plan:   1. Uncontrolled type 2 diabetes mellitus with urine microalbuminemia    - Travis Sharp has currently uncontrolled symptomatic type 2 DM since  68 years of age.  This patient continues to be unengaged for proper care and follow-up.   He missed his appointment since December 2024.  He presents with loss of control in his glycemia.  He did not utilize his  Libre CGM properly.  Has limited data in the machine showing 78% time range he presents with significant improvement  in his glycemic profile.  His point-of-care A1c is 8.2% increasing from 6.7%.  He stay on his Tresiba  50 units nightly and Jardiance  10 mg p.o. daily.   Recent labs reviewed.  - I had a long discussion with him about the progressive nature of diabetes and the pathology behind its complications. -his diabetes is complicated by nonfollow-up and he remains at a high risk for more acute and chronic complications which include CAD, CVA, CKD, retinopathy, and neuropathy. These are all discussed in detail with him.  - I have counseled him on diet  and weight management  by adopting a carbohydrate restricted/protein rich diet. Patient is encouraged to switch to  unprocessed or minimally processed     complex starch and increased protein intake (animal or plant source), fruits, and vegetables. -  he is advised to stick to a routine mealtimes to eat 3 meals  a day and avoid unnecessary snacks ( to snack only to correct hypoglycemia).   This patient is benefiting from lifestyle medicine.  - he acknowledges that there is a room for improvement in his food and drink choices. - Suggestion is made for him to avoid simple carbohydrates  from his diet including Cakes, Sweet Desserts, Ice Cream, Soda (diet and regular), Sweet Tea, Candies, Chips, Cookies, Store Bought Juices, Alcohol , Artificial Sweeteners,  Coffee Creamer, and Sugar-free Products, Lemonade. This will help patient to have more stable blood glucose profile and potentially avoid unintended weight gain.  The following Lifestyle Medicine recommendations according to American College of Lifestyle Medicine  [facility]) were discussed and and offered to patient and he  agrees to start the journey:  A. Whole Foods, Plant-Based Nutrition comprising of fruits and vegetables, plant-based proteins, whole-grain carbohydrates was discussed in detail with the patient.   A list for source of those nutrients were also provided to the patient.  Patient will use only  water  or unsweetened tea for hydration. B.  The need to stay away from risky substances including alcohol, smoking; obtaining 7 to 9 hours of restorative sleep, at least 150 minutes of moderate intensity exercise weekly, the importance of healthy social connections,  and stress management techniques were discussed. C.  A full color page of  Calorie density of various food groups per pound showing examples of each food groups was provided to the patient.      - he will be scheduled with Penny Crumpton, RDN, CDE for diabetes education.  - I have approached him with the following individualized plan to manage  his diabetes and patient agrees:  -He is not utilizing his CGM properly to make reasonable adjustment in his treatment plan. - He is advised to continue Tresiba  50 units nightly, discussed and switched his freestyle libre to 2 freestyle libre 3+ device for ease of utility.   - he is encouraged to call clinic for blood glucose levels less than 70 or above 200 mg /dl weekly average. He does not tolerate metformin.   - He continues to tolerate SGLT2 inhibitors, discussed and advised to continue Jardiance  10 mg p.o. daily at breakfast.      Side effects and precautions discussed with him.   - Specific targets for  A1c;  LDL, HDL,  and Triglycerides were discussed with the patient.  2) Blood Pressure /Hypertension:  -His blood pressure is controlled to target.  he is advised to continue  his current medications including Benicar  40/25 mg p.o. daily with breakfast .  3) Lipids/Hyperlipidemia:   Review of his recent lipid panel showed uncontrolled LDL at 112.  In the interval, received a prescription for Lipitor 20 mg from his cardiologist.  He is advised to continue on this intervention.    4)  Weight/Diet:  Body mass index is 27.03 kg/m.  -      he is not a candidate for major weight loss.    Exercise, and detailed carbohydrates information provided  -  detailed on discharge  instructions.  5) Chronic Care/Health Maintenance:  -he  is on ARB medications and  is encouraged to initiate and continue to follow up with Ophthalmology, Dentist, referred to ophthalmology, podiatrist at least yearly or according to recommendations, and advised to   stay away from smoking. I have recommended yearly flu vaccine and pneumonia vaccine at least every 5 years; moderate intensity exercise for up to 150 minutes weekly; and  sleep for at least 7 hours a day.  - he is  advised to maintain close follow up with [name], MD for primary care needs, as well as his other providers for optimal and coordinated care.  I spent  41  minutes in the care of the patient today including review of labs from CMP, Lipids, Thyroid  Function, Hematology (current and previous including abstractions from other facilities); face-to-face time discussing  his blood glucose readings/logs, discussing hypoglycemia and hyperglycemia episodes and symptoms, medications doses, his options of short and long term treatment based on the latest standards of care / guidelines;  discussion about incorporating lifestyle medicine;  and documenting the encounter. Risk reduction counseling performed per USPSTF guidelines to reduce cardiovascular risk factors.     Please refer to Patient Instructions for Blood Glucose Monitoring and Insulin /Medications Dosing Guide  in media tab for additional information. Please  also refer to  Patient Self Inventory in the Media  tab for reviewed elements of pertinent patient history.  Travis Sharp participated in the discussions, expressed understanding, and voiced agreement with the above plans.  All questions were answered to his satisfaction. he is encouraged to contact clinic should he have any questions or concerns prior to his return visit.   Follow up plan: - Return in about 3 months (around 01/30/2024) for F/U with Pre-visit Labs, Meter/CGM/Logs, A1c here.  Ranny Earl,  MD [facility] [address] [place], [place] [address] Phone: [phone]  Fax: [phone]    10/30/2023, 12:30 PM  This note was partially dictated with voice recognition software. Similar sounding words can be transcribed inadequately or may not  be corrected upon review.

## 2023-10-30 NOTE — Patient Instructions (Signed)

## 2023-11-27 DIAGNOSIS — E11311 Type 2 diabetes mellitus with unspecified diabetic retinopathy with macular edema: Secondary | ICD-10-CM | POA: Diagnosis not present

## 2023-11-27 DIAGNOSIS — H401130 Primary open-angle glaucoma, bilateral, stage unspecified: Secondary | ICD-10-CM | POA: Diagnosis not present

## 2023-11-27 DIAGNOSIS — H35362 Drusen (degenerative) of macula, left eye: Secondary | ICD-10-CM | POA: Diagnosis not present

## 2023-12-01 ENCOUNTER — Other Ambulatory Visit: Payer: Self-pay | Admitting: "Endocrinology

## 2023-12-07 ENCOUNTER — Other Ambulatory Visit: Payer: Self-pay | Admitting: Cardiology

## 2024-01-30 ENCOUNTER — Encounter: Payer: Self-pay | Admitting: Podiatry

## 2024-01-30 ENCOUNTER — Ambulatory Visit: Admitting: Podiatry

## 2024-01-30 DIAGNOSIS — M79675 Pain in left toe(s): Secondary | ICD-10-CM

## 2024-01-30 DIAGNOSIS — M79674 Pain in right toe(s): Secondary | ICD-10-CM | POA: Diagnosis not present

## 2024-01-30 DIAGNOSIS — E1159 Type 2 diabetes mellitus with other circulatory complications: Secondary | ICD-10-CM

## 2024-01-30 DIAGNOSIS — B351 Tinea unguium: Secondary | ICD-10-CM | POA: Diagnosis not present

## 2024-01-30 NOTE — Progress Notes (Signed)
  Subjective:  Patient ID: Travis Sharp, male    DOB: 07-02-1955,  MRN: [id number]  68 y.o. male presents at risk foot care. Pt has h/o NIDDM with PAD and painful mycotic toenails of both feet that are difficult to trim. Pain interferes with daily activities and wearing enclosed shoe gear comfortably. Chief Complaint  Patient presents with   Toe Pain    Diabetic foot care. He states his last A1c was 7 or 8. Dr. Atha is his PCP and his last visit was in July 2025.    New problem(s): None   PCP is [name], MD  No Known Allergies  Review of Systems: Negative except as noted in the HPI.   Objective:  Travis Sharp is a pleasant 68 y.o. male in NAD. AAO x 3.  Vascular Examination: Vascular status intact b/l with palpable pedal pulses. CFT immediate b/l. Pedal hair present. No edema. No pain with calf compression b/l. Skin temperature gradient WNL b/l. No varicosities noted. No cyanosis or clubbing noted.  Neurological Examination: Sensation grossly intact b/l with 10 gram monofilament. Vibratory sensation intact b/l.  Dermatological Examination: Pedal skin with normal turgor, texture and tone b/l. No open wounds nor interdigital macerations noted. Toenails 1-5 b/l thick, discolored, elongated with subungual debris and pain on dorsal palpation. No hyperkeratotic lesions noted b/l.   Musculoskeletal Examination: Muscle strength 5/5 to b/l LE.  No pain, crepitus noted b/l. No gross pedal deformities. Patient ambulates independently without assistive aids.   Radiographs: None Last A1c:      Latest Ref Rng & Units 10/30/2023   11:13 AM 03/28/2023    1:09 PM  Hemoglobin A1C  Hemoglobin-A1c 0.0 - 7.0 % 8.2  6.7      Assessment:   1. Pain due to onychomycosis of toenails of both feet   2. Type 2 diabetes mellitus with vascular disease (HCC)     Plan:  Patient was evaluated and treated. All patient's and/or POA's questions/concerns addressed on today's visit. Mycotic  toenails 1-5 debrided in length and girth without incident.  Continue daily foot inspections and monitor blood glucose per PCP/Endocrinologist's recommendations.Continue soft, supportive shoe gear daily. Report any pedal injuries to medical professional. Call office if there are any quesitons/concerns. -Patient/POA to call should there be question/concern in the interim.  Return in about 3 months (around 05/01/2024).  Delon LITTIE Merlin, DPM      [facility] LOCATION: [address] [place] [address]                   Office [phone]   [facility] LOCATION: [address] [place], [place] [address] Office [phone]

## 2024-02-04 ENCOUNTER — Ambulatory Visit: Admitting: "Endocrinology

## 2024-04-13 ENCOUNTER — Ambulatory Visit: Admitting: "Endocrinology

## 2024-04-13 LAB — COMPREHENSIVE METABOLIC PANEL WITH GFR
ALT: 37 IU/L (ref 0–44)
AST: 29 IU/L (ref 0–40)
Albumin: 4.7 g/dL (ref 3.9–4.9)
Alkaline Phosphatase: 92 IU/L (ref 47–123)
BUN/Creatinine Ratio: 12 (ref 10–24)
BUN: 16 mg/dL (ref 8–27)
Bilirubin Total: 0.5 mg/dL (ref 0.0–1.2)
CO2: 24 mmol/L (ref 20–29)
Calcium: 10.1 mg/dL (ref 8.6–10.2)
Chloride: 103 mmol/L (ref 96–106)
Creatinine, Ser: 1.33 mg/dL — ABNORMAL HIGH (ref 0.76–1.27)
Globulin, Total: 2.5 g/dL (ref 1.5–4.5)
Glucose: 150 mg/dL — ABNORMAL HIGH (ref 70–99)
Potassium: 3.7 mmol/L (ref 3.5–5.2)
Sodium: 143 mmol/L (ref 134–144)
Total Protein: 7.2 g/dL (ref 6.0–8.5)
eGFR: 59 mL/min/1.73 — ABNORMAL LOW

## 2024-04-13 LAB — LIPID PANEL
Chol/HDL Ratio: 4.7 ratio (ref 0.0–5.0)
Cholesterol, Total: 221 mg/dL — ABNORMAL HIGH (ref 100–199)
HDL: 47 mg/dL
LDL Chol Calc (NIH): 130 mg/dL — ABNORMAL HIGH (ref 0–99)
Triglycerides: 248 mg/dL — ABNORMAL HIGH (ref 0–149)
VLDL Cholesterol Cal: 44 mg/dL — ABNORMAL HIGH (ref 5–40)

## 2024-04-13 LAB — VITAMIN D 25 HYDROXY (VIT D DEFICIENCY, FRACTURES): Vit D, 25-Hydroxy: 7.7 ng/mL — ABNORMAL LOW (ref 30.0–100.0)

## 2024-04-13 LAB — T4, FREE: Free T4: 1.06 ng/dL (ref 0.82–1.77)

## 2024-04-13 LAB — TSH: TSH: 3.75 u[IU]/mL (ref 0.450–4.500)

## 2024-04-13 LAB — ENHANCED LIVER FIBROSIS (ELF): ELF(TM) Score: 10.55 — ABNORMAL HIGH

## 2024-04-13 LAB — FIB-4 W/REFLEX TO ELF
FIB-4 Index: 1.51 (ref 0.00–2.67)
Platelets: 211 x10E3/uL (ref 150–450)

## 2024-04-23 ENCOUNTER — Encounter: Payer: Self-pay | Admitting: "Endocrinology

## 2024-04-23 ENCOUNTER — Ambulatory Visit (INDEPENDENT_AMBULATORY_CARE_PROVIDER_SITE_OTHER): Admitting: "Endocrinology

## 2024-04-23 VITALS — BP 140/90 | HR 60 | Ht 70.0 in | Wt 191.0 lb

## 2024-04-23 DIAGNOSIS — E1165 Type 2 diabetes mellitus with hyperglycemia: Secondary | ICD-10-CM | POA: Diagnosis not present

## 2024-04-23 DIAGNOSIS — E782 Mixed hyperlipidemia: Secondary | ICD-10-CM | POA: Diagnosis not present

## 2024-04-23 DIAGNOSIS — Z794 Long term (current) use of insulin: Secondary | ICD-10-CM | POA: Diagnosis not present

## 2024-04-23 DIAGNOSIS — I10 Essential (primary) hypertension: Secondary | ICD-10-CM | POA: Diagnosis not present

## 2024-04-23 LAB — POCT GLYCOSYLATED HEMOGLOBIN (HGB A1C): HbA1c, POC (controlled diabetic range): 7.9 % — AB (ref 0.0–7.0)

## 2024-04-23 MED ORDER — TRESIBA FLEXTOUCH 200 UNIT/ML ~~LOC~~ SOPN: 54.0000 [IU] | PEN_INJECTOR | Freq: Every day | SUBCUTANEOUS | 2 refills | Status: AC

## 2024-04-23 NOTE — Progress Notes (Signed)
 "                          04/23/2024, 5:12 PM   Endocrinology follow-up note  Subjective:    Patient ID: Travis Sharp, male    DOB: 1955/12/24.  Travis Sharp is being seen in follow-up after he was seen in consultation for management of currently uncontrolled symptomatic diabetes requested by  [name], MD.   Past Medical History:  Diagnosis Date   Colitis 03/23/2015   Diabetes mellitus    Gastritis    Hypertension     Past Surgical History:  Procedure Laterality Date   BIOPSY  05/10/2015   Procedure: BIOPSY;  Surgeon: Margo LITTIE Haddock, MD;  Location: [facility];  Service: Endoscopy;;  gastric bx's   CATARACT EXTRACTION  09/2023   COLONOSCOPY WITH PROPOFOL  N/A 05/10/2015   dr. Haddock: Mild diverticulosis, hemorrhoids.  Next colonoscopy 10 years   ESOPHAGOGASTRODUODENOSCOPY (EGD) WITH PROPOFOL  N/A 05/10/2015   Dr. Haddock: dysphagai due to stricture at [facility] s/p dilation, mild nonerosive gastritis.    SAVORY DILATION N/A 05/10/2015   Procedure: SAVORY DILATION;  Surgeon: Margo LITTIE Haddock, MD;  Location: [facility];  Service: Endoscopy;  Laterality: N/A;   TONSILLECTOMY      Social History   Socioeconomic History   Marital status: Married    Spouse name: Sharlet   Number of children: 3   Years of education: Not on file   Highest education level: Not on file  Occupational History   Occupation: [employer]  Tobacco Use   Smoking status: Former    Current packs/day: 0.00    Average packs/day: 1 pack/day for 28.0 years (28.0 ttl pk-yrs)    Types: Cigarettes    Start date: 05/04/1977    Quit date: 05/04/2005    Years since quitting: 18.9   Smokeless tobacco: Never  Substance and Sexual Activity   Alcohol use: Not Currently    Alcohol/week: 0.0 standard drinks of alcohol    Comment: very seldom   Drug use: Not Currently    Types: Marijuana    Comment: denied 10/28/19   Sexual activity: Not on file  Other Topics Concern   Not on file  Social History Narrative    Not on file   Social Drivers of Health   Tobacco Use: Medium Risk (04/23/2024)   Patient History    Smoking Tobacco Use: Former    Smokeless Tobacco Use: Never    Passive Exposure: Not on Actuary Strain: Not on file  Food Insecurity: Not on file  Transportation Needs: Not on file  Physical Activity: Not on file  Stress: Not on file  Social Connections: Not on file  Depression (PHQ2-9): Not on file  Alcohol Screen: Not on file  Housing: Not on file  Utilities: Not on file  Health Literacy: Not on file    Family History  Problem Relation Age of Onset   Colon cancer Other        possibly 2 uncles   Stroke Mother    Aneurysm Mother    Hypertension Mother    Heart attack Father    Hypertension Sister    Hypertension Brother    Hypertension Daughter     Outpatient Encounter Medications as of 04/23/2024  Medication Sig   albuterol  (VENTOLIN  HFA) 108 (90 Base) MCG/ACT inhaler    aspirin 81 MG tablet Take 81 mg by mouth daily.   atorvastatin  (LIPITOR)  20 MG tablet TAKE 1 TABLET BY MOUTH EVERY DAY   brimonidine (ALPHAGAN) 0.2 % ophthalmic solution Place 1 drop into both eyes 3 (three) times daily.   Continuous Glucose Receiver (FREESTYLE LIBRE 3 READER) DEVI 1 Piece by Does not apply route once as needed for up to 1 dose.   Continuous Glucose Sensor (FREESTYLE LIBRE 3 PLUS SENSOR) MISC Change sensor every 15 days.   [employer] D3 125 MCG (5000 UT) capsule TAKE 1 CAPSULE BY MOUTH EVERY DAY   empagliflozin  (JARDIANCE ) 10 MG TABS tablet TAKE 1 TABLET BY MOUTH EVERY DAY BEFORE BREAKFAST   gatifloxacin (ZYMAXID) 0.5 % SOLN Place 1 drop into both eyes.   glucose blood (ACCU-CHEK GUIDE) test strip Use as instructed   insulin  degludec (TRESIBA  FLEXTOUCH) 200 UNIT/ML FlexTouch Pen Inject 54 Units into the skin at bedtime.   meloxicam (MOBIC) 15 MG tablet Take 15 mg by mouth daily.   olmesartan -hydrochlorothiazide  (BENICAR  HCT) 40-25 MG tablet Take 1 tablet by mouth daily.    [DISCONTINUED] insulin  degludec (TRESIBA  FLEXTOUCH) 200 UNIT/ML FlexTouch Pen Inject 50 Units into the skin at bedtime.   No facility-administered encounter medications on file as of 04/23/2024.    ALLERGIES: No Known Allergies  VACCINATION STATUS: Immunization History  Administered Date(s) Administered   Influenza,inj,Quad PF,6+ Mos 03/24/2015   Moderna Sars-Covid-2 Vaccination 06/16/2019, 07/14/2019   Pneumococcal Polysaccharide-23 03/24/2015    Diabetes He presents for his follow-up diabetic visit. He has type 2 diabetes mellitus. Onset time: He was diagnosed at approximate age of 49 years. His disease course has been worsening. There are no hypoglycemic associated symptoms. Pertinent negatives for hypoglycemia include no confusion, headaches, pallor or seizures. Pertinent negatives for diabetes include no blurred vision, no chest pain, no fatigue, no polydipsia, no polyphagia, no polyuria and no weakness. There are no hypoglycemic complications. Symptoms are worsening. There are no diabetic complications. Risk factors for coronary artery disease include diabetes mellitus, hypertension, family history, male sex, tobacco exposure and dyslipidemia. Current diabetic treatment includes insulin  injections. His weight is fluctuating minimally. He is following a generally unhealthy diet. When asked about meal planning, he reported none. He has not had a previous visit with a dietitian. He rarely participates in exercise. His home blood glucose trend is decreasing steadily. His breakfast blood glucose range is generally 140-180 mg/dl. His lunch blood glucose range is generally 140-180 mg/dl. His dinner blood glucose range is generally 140-180 mg/dl. His bedtime blood glucose range is generally 140-180 mg/dl. His overall blood glucose range is 140-180 mg/dl. (  He presents with improved glycemic profile.  He is CGM was reviewed and AGP shows 78% time in range, 20% level 1 hyperglycemia, 2% level 2  hyperglycemia.  Has no hypoglycemia.  His point-of-care A1c was 7.9% improving from 8.2%.      ) An ACE inhibitor/angiotensin II receptor blocker is being taken. Eye exam is not current.  Hypertension This is a chronic problem. The current episode started more than 1 year ago. Pertinent negatives include no blurred vision, chest pain, headaches, neck pain, palpitations or shortness of breath. Risk factors for coronary artery disease include diabetes mellitus, male gender, smoking/tobacco exposure and family history. Past treatments include angiotensin blockers.    Objective:       04/23/2024    3:32 PM 10/30/2023   10:59 AM 07/10/2023    8:44 AM  Vitals with BMI  Height 5' 10 5' 10 5' 10  Weight 191 lbs 188 lbs 6 oz 195 lbs 6 oz  BMI 27.41 27.03 28.04  Systolic 140 114 875  Diastolic 90 78 84  Pulse 60 96 74    BP (!) 140/90   Pulse 60   Ht 5' 10 (1.778 m)   Wt 191 lb (86.6 kg)   BMI 27.41 kg/m   Wt Readings from Last 3 Encounters:  04/23/24 191 lb (86.6 kg)  10/30/23 188 lb 6.4 oz (85.5 kg)  07/10/23 195 lb 6.4 oz (88.6 kg)       CMP ( most recent) CMP     Component Value Date/Time   NA 143 04/06/2024 0851   K 3.7 04/06/2024 0851   CL 103 04/06/2024 0851   CO2 24 04/06/2024 0851   GLUCOSE 150 (H) 04/06/2024 0851   GLUCOSE 195 (H) 01/15/2022 2325   BUN 16 04/06/2024 0851   CREATININE 1.33 (H) 04/06/2024 0851   CREATININE 0.89 10/26/2015 0758   CALCIUM  10.1 04/06/2024 0851   PROT 7.2 04/06/2024 0851   ALBUMIN 4.7 04/06/2024 0851   AST 29 04/06/2024 0851   ALT 37 04/06/2024 0851   ALKPHOS 92 04/06/2024 0851   BILITOT 0.5 04/06/2024 0851   GFRNONAA >60 01/15/2022 2325   GFRAA 70 10/21/2019 0000     Diabetic Labs (most recent): Lab Results  Component Value Date   HGBA1C 7.9 (A) 04/23/2024   HGBA1C 8.2 (A) 10/30/2023   HGBA1C 6.7 03/28/2023   MICROALBUR 150 12/10/2022   MICROALBUR 0.5 10/26/2015     Lipid Panel ( most recent) Lipid Panel      Component Value Date/Time   CHOL 221 (H) 04/06/2024 0851   TRIG 248 (H) 04/06/2024 0851   HDL 47 04/06/2024 0851   CHOLHDL 4.7 04/06/2024 0851   CHOLHDL 3.0 10/26/2015 0758   VLDL 16 10/26/2015 0758   LDLCALC 130 (H) 04/06/2024 0851   LABVLDL 44 (H) 04/06/2024 0851      Lab Results  Component Value Date   TSH 3.750 04/06/2024   TSH 2.820 03/25/2023   TSH 1.600 11/14/2021   TSH 1.510 11/08/2020   TSH 1.62 10/21/2019   TSH 1.23 10/26/2015   FREET4 1.06 04/06/2024   FREET4 0.99 03/25/2023   FREET4 1.20 11/14/2021   FREET4 1.13 11/08/2020   FREET4 1.1 10/26/2015     Assessment & Plan:   1. Uncontrolled type 2 diabetes mellitus with urine microalbuminemia    - Travis Sharp has currently uncontrolled symptomatic type 2 DM since  69 years of age.   He presents with improved glycemic profile.  He is CGM was reviewed and AGP shows 78% time in range, 20% level 1 hyperglycemia, 2% level 2 hyperglycemia.  Has no hypoglycemia.  His point-of-care A1c was 7.9% improving from 8.2%.     Recent labs reviewed.  - I had a long discussion with him about the progressive nature of diabetes and the pathology behind its complications. -his diabetes is complicated by nonfollow-up and he remains at a high risk for more acute and chronic complications which include CAD, CVA, CKD, retinopathy, and neuropathy. These are all discussed in detail with him.  - I have counseled him on diet  and weight management  by adopting a carbohydrate restricted/protein rich diet. Patient is encouraged to switch to  unprocessed or minimally processed     complex starch and increased protein intake (animal or plant source), fruits, and vegetables. -  he is advised to stick to a routine mealtimes to eat 3 meals  a day and avoid unnecessary snacks ( to  snack only to correct hypoglycemia).   This patient is benefiting from lifestyle medicine. - he acknowledges that there is a room for improvement in his food and drink  choices. - Suggestion is made for him to avoid simple carbohydrates  from his diet including Cakes, Sweet Desserts, Ice Cream, Soda (diet and regular), Sweet Tea, Candies, Chips, Cookies, Store Bought Juices, Alcohol , Artificial Sweeteners,  Coffee Creamer, and Sugar-free Products, Lemonade. This will help patient to have more stable blood glucose profile and potentially avoid unintended weight gain.  - he will be scheduled with Penny Crumpton, RDN, CDE for diabetes education.  - I have approached him with the following individualized plan to manage  his diabetes and patient agrees:  He has engaged better.  He is advised to increase Tresiba  to 54 units nightly, advised to use his CGM continuously.  - he is encouraged to call clinic for blood glucose levels less than 70 or above 200 mg /dl weekly average. He does not tolerate metformin.   - He continues to tolerate SGLT2 inhibitors, discussed and advised to continue Jardiance  10 mg p.o. daily at breakfast.      Side effects and precautions discussed with him.   - Specific targets for  A1c;  LDL, HDL,  and Triglycerides were discussed with the patient.  2) Blood Pressure /Hypertension:  His blood pressure is not controlled to target.  he is advised to continue his current medications including Benicar  40/25 mg p.o. daily with breakfast .  3) Lipids/Hyperlipidemia:   Review of his recent lipid panel showed uncontrolled LDL worsening at 130.  He has not been consistent taking his, advised to resume Lipitor 20 mg nightly.  Side effects and precautions discussed with him.   4)  Weight/Diet:  Body mass index is 27.41 kg/m.  -      he is not a candidate for major weight loss.    Exercise, and detailed carbohydrates information provided  -  detailed on discharge instructions.  5) Chronic Care/Health Maintenance:  -he  is on ARB medications and  is encouraged to initiate and continue to follow up with Ophthalmology, Dentist, referred to  ophthalmology, podiatrist at least yearly or according to recommendations, and advised to   stay away from smoking. I have recommended yearly flu vaccine and pneumonia vaccine at least every 5 years; moderate intensity exercise for up to 150 minutes weekly; and  sleep for at least 7 hours a day.  - he is  advised to maintain close follow up with [name], MD for primary care needs, as well as his other providers for optimal and coordinated care.   I spent  26  minutes in the care of the patient today including review of labs from CMP, Lipids, Thyroid  Function, Hematology (current and previous including abstractions from other facilities); face-to-face time discussing  his blood glucose readings/logs, discussing hypoglycemia and hyperglycemia episodes and symptoms, medications doses, his options of short and long term treatment based on the latest standards of care / guidelines;  discussion about incorporating lifestyle medicine;  and documenting the encounter. Risk reduction counseling performed per USPSTF guidelines to reduce  cardiovascular risk factors.     Please refer to Patient Instructions for Blood Glucose Monitoring and Insulin /Medications Dosing Guide  in media tab for additional information. Please  also refer to  Patient Self Inventory in the Media  tab for reviewed elements of pertinent patient history.  Travis Sharp participated in the discussions, expressed understanding, and voiced agreement  with the above plans.  All questions were answered to his satisfaction. he is encouraged to contact clinic should he have any questions or concerns prior to his return visit. Dear Patient: Feel free to review your progress notes.  If you are reviewing this progress note and have questions about the meaning of /or medical terms being used, please make a note and address it at your next follow-up appointment.  Medical notes are meant to be a communication tool between medical professionals and  require medical terms to be used for efficiency and insurance approval.   Follow up plan: - Return in about 6 months (around 10/21/2024) for Bring Meter/CGM Device/Logs- A1c in Office.  Ranny Earl, MD [facility] [address] [place], [place] [address] Phone: [phone]  Fax: [phone]    04/23/2024, 5:12 PM  This note was partially dictated with voice recognition software. Similar sounding words can be transcribed inadequately or may not  be corrected upon review.  "

## 2024-04-23 NOTE — Patient Instructions (Signed)

## 2024-04-27 NOTE — Progress Notes (Signed)
 Travis Sharp                                          MRN: [id number]   04/27/2024   The [facility] Quality Team Specialist reviewed this patient medical record for the purposes of chart review for care gap closure. The following were reviewed: abstraction for care gap closure-kidney health evaluation for diabetes:eGFR  and uACR.    [facility] Quality Team

## 2024-05-11 ENCOUNTER — Ambulatory Visit: Admitting: Podiatry

## 2024-05-19 ENCOUNTER — Encounter: Payer: Self-pay | Admitting: Cardiology

## 2024-05-19 ENCOUNTER — Ambulatory Visit: Admitting: Cardiology

## 2024-05-19 ENCOUNTER — Other Ambulatory Visit: Payer: Self-pay | Admitting: "Endocrinology

## 2024-05-19 VITALS — BP 144/88 | HR 72 | Ht 70.0 in | Wt 187.6 lb

## 2024-05-19 DIAGNOSIS — R0602 Shortness of breath: Secondary | ICD-10-CM | POA: Diagnosis not present

## 2024-05-19 DIAGNOSIS — E782 Mixed hyperlipidemia: Secondary | ICD-10-CM

## 2024-05-19 DIAGNOSIS — I10 Essential (primary) hypertension: Secondary | ICD-10-CM | POA: Diagnosis not present

## 2024-05-19 NOTE — Patient Instructions (Signed)
Medication Instructions:  Continue all current medications.  Labwork: none  Testing/Procedures: none  Follow-Up: 4 months   Any Other Special Instructions Will Be Listed Below (If Applicable).  If you need a refill on your cardiac medications before your next appointment, please call your pharmacy.\ 

## 2024-05-21 ENCOUNTER — Ambulatory Visit: Admitting: Podiatry

## 2024-05-21 NOTE — Progress Notes (Unsigned)
 Toeanisxl 10   Pesplanovalgus orhotics

## 2024-08-17 ENCOUNTER — Ambulatory Visit: Admitting: Podiatry

## 2024-09-22 ENCOUNTER — Ambulatory Visit: Admitting: Cardiology

## 2024-10-20 ENCOUNTER — Ambulatory Visit: Admitting: "Endocrinology
